# Patient Record
Sex: Male | Born: 1940 | Race: White | Hispanic: No | Marital: Married | Smoking: Former smoker
Health system: Southern US, Community
[De-identification: ages and names within clinical notes are randomized; demographics above are authoritative.]

## PROBLEM LIST (undated history)

## (undated) DIAGNOSIS — H3530 Unspecified macular degeneration: Secondary | ICD-10-CM

## (undated) DIAGNOSIS — K222 Esophageal obstruction: Secondary | ICD-10-CM

## (undated) DIAGNOSIS — E878 Other disorders of electrolyte and fluid balance, not elsewhere classified: Secondary | ICD-10-CM

## (undated) DIAGNOSIS — M461 Sacroiliitis, not elsewhere classified: Secondary | ICD-10-CM

## (undated) DIAGNOSIS — H9319 Tinnitus, unspecified ear: Secondary | ICD-10-CM

## (undated) DIAGNOSIS — R200 Anesthesia of skin: Secondary | ICD-10-CM

## (undated) DIAGNOSIS — I10 Essential (primary) hypertension: Secondary | ICD-10-CM

## (undated) DIAGNOSIS — I219 Acute myocardial infarction, unspecified: Secondary | ICD-10-CM

## (undated) DIAGNOSIS — I2510 Atherosclerotic heart disease of native coronary artery without angina pectoris: Secondary | ICD-10-CM

## (undated) DIAGNOSIS — H9191 Unspecified hearing loss, right ear: Secondary | ICD-10-CM

## (undated) DIAGNOSIS — C801 Malignant (primary) neoplasm, unspecified: Secondary | ICD-10-CM

## (undated) DIAGNOSIS — K635 Polyp of colon: Secondary | ICD-10-CM

## (undated) DIAGNOSIS — K219 Gastro-esophageal reflux disease without esophagitis: Secondary | ICD-10-CM

## (undated) DIAGNOSIS — G500 Trigeminal neuralgia: Secondary | ICD-10-CM

## (undated) HISTORY — PX: TRIGEMINAL NERVE BALLOON DECOMPRESSION: SHX2578

## (undated) HISTORY — PX: OTHER SURGICAL HISTORY: SHX169

## (undated) HISTORY — DX: Esophageal obstruction: K22.2

## (undated) HISTORY — PX: CYSTOSCOPY: SUR368

## (undated) HISTORY — DX: Tinnitus, unspecified ear: H93.19

## (undated) HISTORY — PX: ESOPHAGOGASTRODUODENOSCOPY: SHX1529

## (undated) HISTORY — PX: TONSILLECTOMY: SUR1361

## (undated) HISTORY — DX: Unspecified macular degeneration: H35.30

## (undated) HISTORY — PX: PROSTATE BIOPSY: SHX241

---

## 1998-10-05 ENCOUNTER — Ambulatory Visit (HOSPITAL_COMMUNITY): Admission: RE | Admit: 1998-10-05 | Discharge: 1998-10-05 | Payer: Self-pay | Admitting: Gastroenterology

## 2001-04-16 ENCOUNTER — Ambulatory Visit (HOSPITAL_COMMUNITY): Admission: RE | Admit: 2001-04-16 | Discharge: 2001-04-16 | Payer: Self-pay | Admitting: Gastroenterology

## 2001-04-16 ENCOUNTER — Encounter: Payer: Self-pay | Admitting: Gastroenterology

## 2005-01-14 DIAGNOSIS — I219 Acute myocardial infarction, unspecified: Secondary | ICD-10-CM

## 2005-01-14 HISTORY — DX: Acute myocardial infarction, unspecified: I21.9

## 2006-02-20 ENCOUNTER — Inpatient Hospital Stay (HOSPITAL_BASED_OUTPATIENT_CLINIC_OR_DEPARTMENT_OTHER): Admission: RE | Admit: 2006-02-20 | Discharge: 2006-02-20 | Payer: Self-pay | Admitting: Interventional Cardiology

## 2011-06-14 ENCOUNTER — Ambulatory Visit (HOSPITAL_COMMUNITY): Payer: Medicare Other

## 2011-06-14 ENCOUNTER — Ambulatory Visit (HOSPITAL_COMMUNITY)
Admission: RE | Admit: 2011-06-14 | Discharge: 2011-06-14 | Disposition: A | Discharge status: Home / Self Care | Payer: Medicare Other | Source: Ambulatory Visit | Attending: Gastroenterology | Admitting: Gastroenterology

## 2011-06-14 ENCOUNTER — Encounter (HOSPITAL_COMMUNITY): Payer: Self-pay | Admitting: *Deleted

## 2011-06-14 ENCOUNTER — Encounter (HOSPITAL_COMMUNITY)
Admission: RE | Disposition: A | Discharge status: Home / Self Care | Payer: Self-pay | Source: Ambulatory Visit | Attending: Gastroenterology

## 2011-06-14 DIAGNOSIS — K222 Esophageal obstruction: Secondary | ICD-10-CM | POA: Insufficient documentation

## 2011-06-14 DIAGNOSIS — Z79899 Other long term (current) drug therapy: Secondary | ICD-10-CM | POA: Insufficient documentation

## 2011-06-14 DIAGNOSIS — I10 Essential (primary) hypertension: Secondary | ICD-10-CM | POA: Insufficient documentation

## 2011-06-14 DIAGNOSIS — I2510 Atherosclerotic heart disease of native coronary artery without angina pectoris: Secondary | ICD-10-CM | POA: Insufficient documentation

## 2011-06-14 HISTORY — DX: Atherosclerotic heart disease of native coronary artery without angina pectoris: I25.10

## 2011-06-14 HISTORY — PX: SAVORY DILATION: SHX5439

## 2011-06-14 HISTORY — DX: Sacroiliitis, not elsewhere classified: M46.1

## 2011-06-14 HISTORY — PX: ESOPHAGOGASTRODUODENOSCOPY: SHX5428

## 2011-06-14 HISTORY — DX: Anesthesia of skin: R20.0

## 2011-06-14 HISTORY — DX: Esophageal obstruction: K22.2

## 2011-06-14 HISTORY — DX: Polyp of colon: K63.5

## 2011-06-14 HISTORY — DX: Essential (primary) hypertension: I10

## 2011-06-14 HISTORY — DX: Other disorders of electrolyte and fluid balance, not elsewhere classified: E87.8

## 2011-06-14 HISTORY — DX: Trigeminal neuralgia: G50.0

## 2011-06-14 HISTORY — DX: Unspecified hearing loss, right ear: H91.91

## 2011-06-14 SURGERY — EGD (ESOPHAGOGASTRODUODENOSCOPY)
Anesthesia: Moderate Sedation

## 2011-06-14 MED ORDER — BUTAMBEN-TETRACAINE-BENZOCAINE 2-2-14 % EX AERO
INHALATION_SPRAY | CUTANEOUS | Status: DC | PRN
  Administered 2011-06-14 (×2): 1 via TOPICAL

## 2011-06-14 MED ORDER — MIDAZOLAM HCL 10 MG/2ML IJ SOLN
INTRAMUSCULAR | Status: DC | PRN
  Administered 2011-06-14: 2 mg via INTRAVENOUS
  Administered 2011-06-14: 1 mg via INTRAVENOUS
  Administered 2011-06-14: 2 mg via INTRAVENOUS

## 2011-06-14 MED ORDER — FENTANYL CITRATE 0.05 MG/ML IJ SOLN
INTRAMUSCULAR | Status: AC
  Filled 2011-06-14: qty 2

## 2011-06-14 MED ORDER — SODIUM CHLORIDE 0.9 % IV SOLN
Freq: Once | INTRAVENOUS | Status: AC
  Administered 2011-06-14: 500 mL via INTRAVENOUS

## 2011-06-14 MED ORDER — FENTANYL NICU IV SYRINGE 50 MCG/ML
INJECTION | INTRAMUSCULAR | Status: DC | PRN
  Administered 2011-06-14 (×2): 25 ug via INTRAVENOUS

## 2011-06-14 MED ORDER — MIDAZOLAM HCL 10 MG/2ML IJ SOLN
INTRAMUSCULAR | Status: AC
  Filled 2011-06-14: qty 2

## 2011-06-14 NOTE — Discharge Instructions (Addendum)
Continue current meds. Clear liquids for 4-6 hours, if no chest pain or trouble breathing, soft foods tonight.  Regular diet tomorrow. Call for problems. Call Dr Randa Evens for further problemsEndoscopy Care After Please read the instructions outlined below and refer to this sheet in the next few weeks. These discharge instructions provide you with general information on caring for yourself after you leave the hospital. Your doctor may also give you specific instructions. While your treatment has been planned according to the most current medical practices available, unavoidable complications occasionally occur. If you have any problems or questions after discharge, please call your doctor. HOME CARE INSTRUCTIONS Activity  You may resume your regular activity but move at a slower pace for the next 24 hours.   Take frequent rest periods for the next 24 hours.   Walking will help expel (get rid of) the air and reduce the bloated feeling in your abdomen.   No driving for 24 hours (because of the anesthesia (medicine) used during the test).   You may shower.   Do not sign any important legal documents or operate any machinery for 24 hours (because of the anesthesia used during the test).  Nutrition  Drink plenty of fluids.   You may resume your normal diet.   Begin with a light meal and progress to your normal diet.   Avoid alcoholic beverages for 24 hours or as instructed by your caregiver.  Medications You may resume your normal medications unless your caregiver tells you otherwise. What you can expect today  You may experience abdominal discomfort such as a feeling of fullness or "gas" pains.   You may experience a sore throat for 2 to 3 days. This is normal. Gargling with salt water may help this.  Follow-up Your doctor will discuss the results of your test with you. SEEK IMMEDIATE MEDICAL CARE IF:  You have excessive nausea (feeling sick to your stomach) and/or vomiting.   You  have severe abdominal pain and distention (swelling).   You have trouble swallowing.   You have a temperature over 100 F (37.8 C).   You have rectal bleeding or vomiting of blood.  Document Released: 08/15/2003 Document Revised: 12/20/2010 Document Reviewed: 02/25/2007 [facility] Patient Information 2012 [place], [place].

## 2011-06-14 NOTE — Op Note (Signed)
[facility] [address] [place], [place]  [address]  ENDOSCOPY PROCEDURE REPORT  PATIENT:  Billy Knox, Billy Knox  MR#:  [id number] BIRTHDATE:  [birth year], 70 yrs. old  GENDER:  male  ENDOSCOPIST:  Carman Ching Referred by:  Catha Gosselin, M.D.  PROCEDURE DATE:  06/14/2011 PROCEDURE:  EGD with dilatation over guidewire ASA CLASS:  Class II INDICATIONS:  dysphagia with history of stricture dilated about 10 yrs  MEDICATIONS:   Fentanyl 50 mcg IV, Versed 5 mg IV TOPICAL ANESTHETIC:  Cetacaine Spray  DESCRIPTION OF PROCEDURE:   After the risks and benefits of the procedure were explained, informed consent was obtained.  The Pentax Gastroscope [id number] endoscope was introduced through the mouth and advanced to the second portion of the duodenum.  The instrument was slowly withdrawn as the mucosa was fully examined.  the duodenal bulb and second duodenum were without abnormalities. nl stomach.  A stricture was found at the gastroesophageal junction. there was a very minimal stricture the scope easily passed savary / guidewire 14 mm Savary 15 mm Savary both dilators passed very easily. There was a small amount of heme on the 15 mm dilator and guidewire were subsequently removed.    Retroflexed views revealed no abnormalities.    The scope was then withdrawn from the patient and the procedure completed.  COMPLICATIONS:  A complication of none occurred on 06/14/2011 at.  ENDOSCOPIC IMPRESSION: 1) Nl duodenum 2) Nl stomach 3) Stricture at the gastroesophageal junction stricture dilated to 15 mm RECOMMENDATIONS: 1) Clear liquids for 6 hours, call for chest pain. Soft diet today, resume regular diet tomorrow. return to office or call when necessary for furtherdysphagia  ______________________________ Carman Ching  CC:  Catha Gosselin, MD  n. Rosalie DoctorCarman Ching at 06/14/2011 09:45 AM  Michaela Corner, [phone]

## 2011-06-14 NOTE — H&P (Signed)
  Subjective:   Patient is a 71 y.o. male presents with dysphagia and history of esophageal stricture for dilatation. Procedure including risks and benefits discussed in office.  There are no active problems to display for this patient.  Past Medical History  Diagnosis Date  . Coronary artery disease   . Hypertension   . Hyperchloremia   . Esophageal stricture   . Colon polyp   . Sacroiliac inflammation   . Trigeminal neuralgia   . Deafness in right ear   . Rt facial numbness     Past Surgical History  Procedure Date  . Prostate biopsy   . Gamma knife   . Esophagogastroduodenoscopy     Prescriptions prior to admission  Medication Sig Dispense Refill  . acetaminophen (TYLENOL) 325 MG tablet Take 650 mg by mouth every 6 (six) hours as needed.      Marland Kitchen aspirin 81 MG tablet Take 81 mg by mouth daily.      . finasteride (PROSCAR) 5 MG tablet Take 5 mg by mouth daily.      . Glucosamine HCl 1500 MG TABS Take by mouth.      Marland Kitchen lisinopril (PRINIVIL,ZESTRIL) 10 MG tablet Take 10 mg by mouth daily.      . metoprolol (LOPRESSOR) 50 MG tablet Take 50 mg by mouth daily.      . niacin (NIASPAN) 500 MG CR tablet Take 500 mg by mouth at bedtime.      . nortriptyline (PAMELOR) 50 MG capsule Take 50 mg by mouth at bedtime.      . Omega-3 Fatty Acids (FISH OIL) 1200 MG CAPS Take 2,400 mg by mouth 2 (two) times daily.      . pravastatin (PRAVACHOL) 40 MG tablet Take 40 mg by mouth daily.      . nitroGLYCERIN (NITROSTAT) 0.4 MG SL tablet Place 0.4 mg under the tongue every 5 (five) minutes as needed.       Not on File  History  Substance Use Topics  . Smoking status: Former Smoker    Quit date: 06/14/1967  . Smokeless tobacco: Not on file  . Alcohol Use: 0.5 oz/week    1 drink(s) per week    Family History  Problem Relation Age of Onset  . Colon cancer Mother      Objective:   Patient Vitals for the past 8 hrs:  BP Temp Pulse Resp Height Weight  06/14/11 0858 147/87 mmHg 97.7 F  (36.5 C) 72  22  6\' 2"  (1.88 m) 106.595 kg (235 lb)         See MD Preop evaluation      Assessment:   Active Problems:  * No active hospital problems. *    Plan:   EGD and Savary dilatation under fluoroscopic control. Procedure and risk again discussed with patient

## 2011-06-18 ENCOUNTER — Encounter (HOSPITAL_COMMUNITY): Payer: Self-pay | Admitting: Gastroenterology

## 2012-10-12 ENCOUNTER — Other Ambulatory Visit: Payer: Self-pay | Admitting: Interventional Cardiology

## 2012-10-12 DIAGNOSIS — E785 Hyperlipidemia, unspecified: Secondary | ICD-10-CM

## 2012-10-12 DIAGNOSIS — Z79899 Other long term (current) drug therapy: Secondary | ICD-10-CM

## 2012-10-15 ENCOUNTER — Ambulatory Visit (INDEPENDENT_AMBULATORY_CARE_PROVIDER_SITE_OTHER): Payer: Medicare Other | Admitting: Interventional Cardiology

## 2012-10-15 DIAGNOSIS — E7800 Pure hypercholesterolemia, unspecified: Secondary | ICD-10-CM

## 2012-10-15 LAB — LIPID PANEL: Cholesterol: 155 mg/dL (ref 0–200)

## 2012-12-04 ENCOUNTER — Encounter: Payer: Self-pay | Admitting: Interventional Cardiology

## 2012-12-04 ENCOUNTER — Telehealth: Payer: Self-pay | Admitting: Acute Care

## 2013-01-15 ENCOUNTER — Telehealth: Payer: Self-pay

## 2013-01-15 MED ORDER — PRAVASTATIN SODIUM 40 MG PO TABS: ORAL_TABLET | ORAL | Status: DC

## 2013-01-15 NOTE — Telephone Encounter (Signed)
Refilled. Pt aware

## 2013-01-20 ENCOUNTER — Other Ambulatory Visit: Payer: Self-pay

## 2013-01-20 MED ORDER — PRAVASTATIN SODIUM 40 MG PO TABS: ORAL_TABLET | ORAL | Status: DC

## 2013-01-25 ENCOUNTER — Other Ambulatory Visit: Payer: Self-pay

## 2013-01-25 MED ORDER — PRAVASTATIN SODIUM 40 MG PO TABS: ORAL_TABLET | ORAL | Status: DC

## 2013-02-01 ENCOUNTER — Telehealth: Payer: Self-pay

## 2013-02-02 MED ORDER — NIACIN ER (ANTIHYPERLIPIDEMIC) 500 MG PO TBCR: 500.0000 mg | EXTENDED_RELEASE_TABLET | Freq: Every day | ORAL | Status: DC

## 2013-02-02 NOTE — Telephone Encounter (Signed)
Refilled

## 2013-02-08 ENCOUNTER — Telehealth: Payer: Self-pay | Admitting: *Deleted

## 2013-02-08 MED ORDER — PRAVASTATIN SODIUM 80 MG PO TABS: 80.0000 mg | ORAL_TABLET | Freq: Every day | ORAL | Status: DC

## 2013-02-08 NOTE — Telephone Encounter (Signed)
Formulary change for insurance purposes

## 2013-02-15 ENCOUNTER — Telehealth: Payer: Self-pay | Admitting: *Deleted

## 2013-02-15 NOTE — Telephone Encounter (Signed)
Per [facility] for Dr Elisabeth Cara, 2015 formulary change pravastatin 40 mg bid to 80 mg daily

## 2013-02-18 NOTE — Telephone Encounter (Signed)
Quality limit exception request for pravastatin approved through [employer]. Member #[id number]

## 2013-02-22 ENCOUNTER — Other Ambulatory Visit: Payer: Self-pay

## 2013-02-22 MED ORDER — PRAVASTATIN SODIUM 80 MG PO TABS: 80.0000 mg | ORAL_TABLET | Freq: Every day | ORAL | Status: DC

## 2013-03-16 ENCOUNTER — Encounter: Payer: Self-pay | Admitting: Interventional Cardiology

## 2013-03-16 DIAGNOSIS — G500 Trigeminal neuralgia: Secondary | ICD-10-CM | POA: Insufficient documentation

## 2013-03-16 DIAGNOSIS — K635 Polyp of colon: Secondary | ICD-10-CM | POA: Insufficient documentation

## 2013-03-16 DIAGNOSIS — K222 Esophageal obstruction: Secondary | ICD-10-CM | POA: Insufficient documentation

## 2013-03-16 DIAGNOSIS — I10 Essential (primary) hypertension: Secondary | ICD-10-CM | POA: Insufficient documentation

## 2013-03-16 DIAGNOSIS — R200 Anesthesia of skin: Secondary | ICD-10-CM | POA: Insufficient documentation

## 2013-03-16 DIAGNOSIS — I2510 Atherosclerotic heart disease of native coronary artery without angina pectoris: Secondary | ICD-10-CM | POA: Insufficient documentation

## 2013-03-16 DIAGNOSIS — H9191 Unspecified hearing loss, right ear: Secondary | ICD-10-CM | POA: Insufficient documentation

## 2013-03-16 DIAGNOSIS — E878 Other disorders of electrolyte and fluid balance, not elsewhere classified: Secondary | ICD-10-CM | POA: Insufficient documentation

## 2013-03-16 DIAGNOSIS — M461 Sacroiliitis, not elsewhere classified: Secondary | ICD-10-CM | POA: Insufficient documentation

## 2013-03-23 ENCOUNTER — Ambulatory Visit: Payer: Medicare Other | Admitting: Interventional Cardiology

## 2013-03-24 ENCOUNTER — Ambulatory Visit (INDEPENDENT_AMBULATORY_CARE_PROVIDER_SITE_OTHER): Payer: Medicare Other | Admitting: Interventional Cardiology

## 2013-03-24 ENCOUNTER — Encounter: Payer: Self-pay | Admitting: Interventional Cardiology

## 2013-03-24 VITALS — BP 140/80 | HR 59 | Ht 74.0 in | Wt 227.8 lb

## 2013-03-24 DIAGNOSIS — I2510 Atherosclerotic heart disease of native coronary artery without angina pectoris: Secondary | ICD-10-CM

## 2013-03-24 DIAGNOSIS — I10 Essential (primary) hypertension: Secondary | ICD-10-CM

## 2013-03-24 DIAGNOSIS — E782 Mixed hyperlipidemia: Secondary | ICD-10-CM | POA: Insufficient documentation

## 2013-03-24 DIAGNOSIS — Z79899 Other long term (current) drug therapy: Secondary | ICD-10-CM

## 2013-03-24 LAB — COMPREHENSIVE METABOLIC PANEL
ALK PHOS: 49 U/L (ref 39–117)
ALT: 33 U/L (ref 0–53)
AST: 27 U/L (ref 0–37)
Albumin: 4.5 g/dL (ref 3.5–5.2)
BILIRUBIN TOTAL: 0.7 mg/dL (ref 0.3–1.2)
BUN: 10 mg/dL (ref 6–23)
CHLORIDE: 99 meq/L (ref 96–112)
CO2: 29 mEq/L (ref 19–32)
CREATININE: 0.9 mg/dL (ref 0.4–1.5)
Calcium: 9.4 mg/dL (ref 8.4–10.5)
GFR: 91.58 mL/min (ref >60.00)
Glucose, Bld: 100 mg/dL — ABNORMAL HIGH (ref 70–99)
Potassium: 3.9 mEq/L (ref 3.5–5.1)
Sodium: 136 mEq/L (ref 135–145)
Total Protein: 7.3 g/dL (ref 6.0–8.3)

## 2013-03-24 NOTE — Progress Notes (Signed)
Patient ID: Billy Knox, male   DOB: 06/26/1940, 73 y.o.   MRN: [id number]    [place] [place], [place]  [address] Phone: [phone] Fax:  [phone]  Date:  03/24/2013   ID:  Billy Knox, DOB 1940-06-23, MRN [id number]  PCP:  Gennette Pac, MD      History of Present Illness: Billy Knox is a 73 y.o. male with CAD. He has had gamma knife surgery for tic doloreux in 2013.  He still has sx. He walks for 3 miles/hour x 1 hour, nearly daily. More in the summer. He has lost some weight. CAD/ASCVD:  Denies : Chest pain.  Dizziness.  Dyspnea on exertion.  Leg edema.  Nitroglycerin.  Palpitations.  Shortness of breath.  Syncope.     Wt Readings from Last 3 Encounters:  03/24/13 227 lb 12.8 oz (103.329 kg)  06/14/11 235 lb (106.595 kg)  06/14/11 235 lb (106.595 kg)     Past Medical History  Diagnosis Date  . Coronary artery disease   . Hypertension   . Hyperchloremia   . Esophageal stricture   . Colon polyp   . Sacroiliac inflammation   . Trigeminal neuralgia   . Deafness in right ear   . Rt facial numbness     Current Outpatient Prescriptions  Medication Sig Dispense Refill  . acetaminophen (TYLENOL) 325 MG tablet Take 650 mg by mouth every 6 (six) hours as needed.      Marland Kitchen aspirin 81 MG tablet Take 81 mg by mouth daily.      . finasteride (PROSCAR) 5 MG tablet Take 5 mg by mouth daily.      . Glucosamine HCl 1500 MG TABS Take by mouth.      Marland Kitchen lisinopril (PRINIVIL,ZESTRIL) 10 MG tablet Take 20 mg by mouth daily.       . metoprolol (LOPRESSOR) 50 MG tablet Take 50 mg by mouth daily.      . niacin (NIASPAN) 500 MG CR tablet Take 1 tablet (500 mg total) by mouth at bedtime.  90 tablet  3  . nitroGLYCERIN (NITROSTAT) 0.4 MG SL tablet Place 0.4 mg under the tongue every 5 (five) minutes as needed.      . nortriptyline (PAMELOR) 50 MG capsule Take 50 mg by mouth at bedtime.      . Omega-3 Fatty Acids (FISH OIL) 1200 MG CAPS Take 2,400 mg by  mouth 2 (two) times daily.      . pravastatin (PRAVACHOL) 80 MG tablet Take 1 tablet (80 mg total) by mouth daily.  90 tablet  1   No current facility-administered medications for this visit.    Allergies:   No Known Allergies  Social History:  The patient  reports that he quit smoking about 45 years ago. He does not have any smokeless tobacco history on file. He reports that he drinks about 0.5 ounces of alcohol per week. He reports that he does not use illicit drugs.   Family History:  The patient's family history includes Colon cancer in his mother.   ROS:  Please see the history of present illness.  No nausea, vomiting.  No fevers, chills.  No focal weakness.  No dysuria.    All other systems reviewed and negative.   PHYSICAL EXAM: VS:  BP 140/80  Pulse 59  Ht 6\' 2"  (1.88 m)  Wt 227 lb 12.8 oz (103.329 kg)  BMI 29.24 kg/m2 Well nourished, well developed, in no acute distress  HEENT: normal Neck: no JVD, no carotid bruits Cardiac:  normal S1, S2; RRR;  Lungs:  clear to auscultation bilaterally, no wheezing, rhonchi or rales Abd: soft, nontender, no hepatomegaly Ext: no edema Skin: warm and dry Neuro:   no focal abnormalities noted  EKG:  NSR, no ST segments   ASSESSMENT AND PLAN:  Coronary atherosclerosis of native coronary artery  Continue Aspir-81 Tablet Delayed Release, 81 MG, 1 tablet, Orally, Once a day Refill Metoprolol Succinate Tablet Extended Release 24 Hour, 50, 1 tablet, Orally, once a day, 90 days, 90, Refills 3 Refill Nitroglycerin Tablet Sublingual, 0.4 MG, 1 tablet under the tongue, Sublingual, as directed, 30 days, 25, Refills 3 IMAGING: EKG    Corson,Danielle 03/24/2012 08:59:42 AM > [name],JAY 03/24/2012 09:31:57 AM > NSR, no significant ST segment changes   Notes: No angina. Walking outside these days when weather allows, otherwise inside. Encouraged him to continue. Walks a full hour without stopping.   2. Hypertension, essential  Continue  Lisinopril Tablet, 10 MG, 1-2 tablet, Orally, once a day, 90 days, 180, Refills 3 Notes: Controlled at home. 130/70 range. Taes 1.5 tabs of lisinopril daily at this time.    3. Pure hypercholesterolemia  Continue Pravastatin Sodium Tablet, 40 MG, 2 tablet, orally, once a day, 180, Refills 3 Continue Fish Oil Capsule, 4800 mg, Orally, daily per Dr Irish Lack Refill Niaspan Tablet Extended Release, 500 MG, 1 tablet at bedtime, Orally, Once a day, 90 days, 90, Refills 3 Notes: Controlled in 7/13. ; 10/14: TG 195, LDL 78, HL 38 TC 155. Check CMet today. Repeat lipids in 10/15.   4. Obesity, unspecified  Notes: Continue efforts to try to lose weight through diet control and increased exercise.    Preventive Medicine  Adult topics discussed:  Exercise: at least5 days a week, at least 30 minutes of aerobic exercise.      Signed, Mina Marble, MD, [facility] 03/24/2013 9:14 AM

## 2013-03-24 NOTE — Patient Instructions (Addendum)
Your physician recommends that you return for lab work today for CMET.  Your physician recommends that you return for a FASTING lipid profile: 10/25/13  Your physician wants you to follow-up in: 1 year with Dr. Irish Lack. You will receive a reminder letter in the mail two months in advance. If you don't receive a letter, please call our office to schedule the follow-up appointment.

## 2013-03-26 ENCOUNTER — Telehealth: Payer: Self-pay | Admitting: Cardiology

## 2013-03-26 NOTE — Telephone Encounter (Signed)
Message copied by Alcario Drought on Fri Mar 26, 2013  9:32 AM ------      Message from: Jettie Booze      Created: Wed Mar 24, 2013 11:24 PM       Electrolytes stable. COntinue current meds, ------

## 2013-05-04 ENCOUNTER — Telehealth: Payer: Self-pay | Admitting: *Deleted

## 2013-05-04 MED ORDER — METOPROLOL SUCCINATE ER 50 MG PO TB24: 50.0000 mg | ORAL_TABLET | Freq: Every day | ORAL | Status: DC

## 2013-05-04 NOTE — Telephone Encounter (Signed)
Spoke with pt and he is taking Topril XL 50 mg once a day. Refill to [facility] and refill to [facility].

## 2013-05-04 NOTE — Telephone Encounter (Signed)
Refills sent in. Pt aware.

## 2013-05-04 NOTE — Telephone Encounter (Signed)
[facility] requests refill on metoprolol succinate for the patient, but his list has metoprolol tartrate. Which one is he on? He is out so they also requested that we [facility]?? Please advise. Thanks, MI

## 2013-05-21 ENCOUNTER — Other Ambulatory Visit: Payer: Self-pay | Admitting: Family Medicine

## 2013-05-21 ENCOUNTER — Ambulatory Visit
Admission: RE | Admit: 2013-05-21 | Discharge: 2013-05-21 | Disposition: A | Discharge status: Home / Self Care | Payer: Medicare Other | Source: Ambulatory Visit | Attending: Family Medicine | Admitting: Family Medicine

## 2013-05-21 DIAGNOSIS — M545 Low back pain, unspecified: Secondary | ICD-10-CM

## 2013-08-26 NOTE — Telephone Encounter (Signed)
error 

## 2013-09-02 ENCOUNTER — Other Ambulatory Visit: Payer: Self-pay

## 2013-09-02 MED ORDER — PRAVASTATIN SODIUM 80 MG PO TABS: 80.0000 mg | ORAL_TABLET | Freq: Every day | ORAL | Status: DC

## 2013-09-09 ENCOUNTER — Other Ambulatory Visit: Payer: Self-pay

## 2013-09-09 MED ORDER — LISINOPRIL 10 MG PO TABS: 20.0000 mg | ORAL_TABLET | Freq: Every day | ORAL | Status: DC

## 2013-09-16 ENCOUNTER — Other Ambulatory Visit: Payer: Self-pay | Admitting: *Deleted

## 2013-09-16 MED ORDER — PRAVASTATIN SODIUM 80 MG PO TABS: 80.0000 mg | ORAL_TABLET | Freq: Every day | ORAL | Status: DC

## 2013-10-25 ENCOUNTER — Other Ambulatory Visit (INDEPENDENT_AMBULATORY_CARE_PROVIDER_SITE_OTHER): Payer: Medicare Other | Admitting: *Deleted

## 2013-10-25 DIAGNOSIS — I2510 Atherosclerotic heart disease of native coronary artery without angina pectoris: Secondary | ICD-10-CM

## 2013-10-25 LAB — LIPID PANEL
Cholesterol: 139 mg/dL (ref 0–200)
HDL: 33.2 mg/dL — ABNORMAL LOW (ref >39.00)
LDL Cholesterol: 74 mg/dL (ref 0–99)
NonHDL: 105.8
Total CHOL/HDL Ratio: 4
Triglycerides: 159 mg/dL — ABNORMAL HIGH (ref 0.0–149.0)
VLDL: 31.8 mg/dL (ref 0.0–40.0)

## 2013-10-25 LAB — HEPATIC FUNCTION PANEL
ALBUMIN: 3.7 g/dL (ref 3.5–5.2)
ALK PHOS: 42 U/L (ref 39–117)
ALT: 22 U/L (ref 0–53)
AST: 23 U/L (ref 0–37)
BILIRUBIN DIRECT: 0 mg/dL (ref 0.0–0.3)
BILIRUBIN TOTAL: 0.6 mg/dL (ref 0.2–1.2)
Total Protein: 7.1 g/dL (ref 6.0–8.3)

## 2013-11-02 NOTE — Progress Notes (Signed)
Patient informed of labs

## 2014-01-31 ENCOUNTER — Other Ambulatory Visit: Payer: Self-pay | Admitting: *Deleted

## 2014-01-31 MED ORDER — PRAVASTATIN SODIUM 80 MG PO TABS: 80.0000 mg | ORAL_TABLET | Freq: Every day | ORAL | Status: DC

## 2014-02-03 ENCOUNTER — Other Ambulatory Visit: Payer: Self-pay

## 2014-02-03 MED ORDER — LISINOPRIL 10 MG PO TABS: 20.0000 mg | ORAL_TABLET | Freq: Every day | ORAL | Status: DC

## 2014-02-21 ENCOUNTER — Other Ambulatory Visit: Payer: Self-pay | Admitting: Interventional Cardiology

## 2014-03-23 ENCOUNTER — Other Ambulatory Visit: Payer: Self-pay | Admitting: *Deleted

## 2014-03-23 MED ORDER — METOPROLOL SUCCINATE ER 50 MG PO TB24: 50.0000 mg | ORAL_TABLET | Freq: Every day | ORAL | Status: DC

## 2014-08-08 ENCOUNTER — Encounter (HOSPITAL_COMMUNITY): Payer: Self-pay | Admitting: *Deleted

## 2014-08-08 ENCOUNTER — Other Ambulatory Visit: Payer: Self-pay | Admitting: Gastroenterology

## 2014-08-08 ENCOUNTER — Other Ambulatory Visit: Payer: Self-pay | Admitting: Physician Assistant

## 2014-08-08 DIAGNOSIS — K222 Esophageal obstruction: Secondary | ICD-10-CM

## 2014-08-08 DIAGNOSIS — R1310 Dysphagia, unspecified: Secondary | ICD-10-CM

## 2014-08-09 ENCOUNTER — Ambulatory Visit
Admission: RE | Admit: 2014-08-09 | Discharge: 2014-08-09 | Disposition: A | Discharge status: Home / Self Care | Payer: Medicare Other | Source: Ambulatory Visit | Attending: Physician Assistant | Admitting: Physician Assistant

## 2014-08-09 DIAGNOSIS — K222 Esophageal obstruction: Secondary | ICD-10-CM

## 2014-08-09 DIAGNOSIS — R1310 Dysphagia, unspecified: Secondary | ICD-10-CM

## 2014-08-09 NOTE — Addendum Note (Signed)
Addended by: Vernie Ammons. on: 08/09/2014 12:38 PM   Modules accepted: Orders

## 2014-08-12 ENCOUNTER — Encounter (HOSPITAL_COMMUNITY)
Admission: RE | Disposition: A | Discharge status: Home / Self Care | Payer: Self-pay | Source: Ambulatory Visit | Attending: Gastroenterology

## 2014-08-12 ENCOUNTER — Encounter (HOSPITAL_COMMUNITY): Payer: Self-pay

## 2014-08-12 ENCOUNTER — Ambulatory Visit (HOSPITAL_COMMUNITY): Payer: Medicare Other | Admitting: Anesthesiology

## 2014-08-12 ENCOUNTER — Ambulatory Visit (HOSPITAL_COMMUNITY): Payer: Medicare Other

## 2014-08-12 ENCOUNTER — Ambulatory Visit (HOSPITAL_COMMUNITY)
Admission: RE | Admit: 2014-08-12 | Discharge: 2014-08-12 | Disposition: A | Discharge status: Home / Self Care | Payer: Medicare Other | Source: Ambulatory Visit | Attending: Gastroenterology | Admitting: Gastroenterology

## 2014-08-12 DIAGNOSIS — Z87891 Personal history of nicotine dependence: Secondary | ICD-10-CM | POA: Diagnosis not present

## 2014-08-12 DIAGNOSIS — E782 Mixed hyperlipidemia: Secondary | ICD-10-CM | POA: Insufficient documentation

## 2014-08-12 DIAGNOSIS — G500 Trigeminal neuralgia: Secondary | ICD-10-CM | POA: Diagnosis not present

## 2014-08-12 DIAGNOSIS — I2510 Atherosclerotic heart disease of native coronary artery without angina pectoris: Secondary | ICD-10-CM | POA: Diagnosis not present

## 2014-08-12 DIAGNOSIS — Z86010 Personal history of colonic polyps: Secondary | ICD-10-CM | POA: Diagnosis not present

## 2014-08-12 DIAGNOSIS — I252 Old myocardial infarction: Secondary | ICD-10-CM | POA: Insufficient documentation

## 2014-08-12 DIAGNOSIS — I10 Essential (primary) hypertension: Secondary | ICD-10-CM | POA: Insufficient documentation

## 2014-08-12 DIAGNOSIS — E878 Other disorders of electrolyte and fluid balance, not elsewhere classified: Secondary | ICD-10-CM | POA: Diagnosis not present

## 2014-08-12 DIAGNOSIS — Z79899 Other long term (current) drug therapy: Secondary | ICD-10-CM | POA: Diagnosis not present

## 2014-08-12 DIAGNOSIS — K222 Esophageal obstruction: Secondary | ICD-10-CM | POA: Diagnosis not present

## 2014-08-12 DIAGNOSIS — Z7982 Long term (current) use of aspirin: Secondary | ICD-10-CM | POA: Insufficient documentation

## 2014-08-12 DIAGNOSIS — K219 Gastro-esophageal reflux disease without esophagitis: Secondary | ICD-10-CM | POA: Diagnosis not present

## 2014-08-12 DIAGNOSIS — H9191 Unspecified hearing loss, right ear: Secondary | ICD-10-CM | POA: Diagnosis not present

## 2014-08-12 HISTORY — DX: Acute myocardial infarction, unspecified: I21.9

## 2014-08-12 HISTORY — DX: Gastro-esophageal reflux disease without esophagitis: K21.9

## 2014-08-12 HISTORY — PX: SAVORY DILATION: SHX5439

## 2014-08-12 HISTORY — PX: ESOPHAGOGASTRODUODENOSCOPY (EGD) WITH PROPOFOL: SHX5813

## 2014-08-12 SURGERY — ESOPHAGOGASTRODUODENOSCOPY (EGD) WITH PROPOFOL
Anesthesia: Monitor Anesthesia Care

## 2014-08-12 MED ORDER — PROPOFOL 10 MG/ML IV BOLUS
INTRAVENOUS | Status: AC
  Filled 2014-08-12: qty 20

## 2014-08-12 MED ORDER — PROPOFOL 500 MG/50ML IV EMUL
INTRAVENOUS | Status: DC | PRN
  Administered 2014-08-12: 40 mg via INTRAVENOUS

## 2014-08-12 MED ORDER — SODIUM CHLORIDE 0.9 % IV SOLN: INTRAVENOUS | Status: DC

## 2014-08-12 MED ORDER — LACTATED RINGERS IV SOLN
INTRAVENOUS | Status: DC
  Administered 2014-08-12: 10:00:00 via INTRAVENOUS
  Administered 2014-08-12: 1000 mL via INTRAVENOUS

## 2014-08-12 MED ORDER — PROPOFOL INFUSION 10 MG/ML OPTIME
INTRAVENOUS | Status: DC | PRN
  Administered 2014-08-12: 100 ug/kg/min via INTRAVENOUS

## 2014-08-12 MED ORDER — LIDOCAINE HCL (CARDIAC) 20 MG/ML IV SOLN
INTRAVENOUS | Status: AC
  Filled 2014-08-12: qty 5

## 2014-08-12 SURGICAL SUPPLY — 15 items

## 2014-08-12 NOTE — Discharge Instructions (Addendum)
Continue current meds. Clear liquids for 4-6 hours, if no chest pain or trouble breathing, soft foods tonight.  Regular diet tomorrow. Call for problems. Suggest OTC omeprazole 1 tablet daily    Esophagogastroduodenoscopy Care After Refer to this sheet in the next few weeks. These instructions provide you with information on caring for yourself after your procedure. Your caregiver may also give you more specific instructions. Your treatment has been planned according to current medical practices, but problems sometimes occur. Call your caregiver if you have any problems or questions after your procedure.  HOME CARE INSTRUCTIONS  Do not eat or drink anything until the numbing medicine (local anesthetic) has worn off and your gag reflex has returned. You will know that the local anesthetic has worn off when you can swallow comfortably.  Do not drive for 12 hours after the procedure or as directed by your caregiver.  Only take medicines as directed by your caregiver. SEEK MEDICAL CARE IF:   You cannot stop coughing.  You are not urinating at all or less than usual. SEEK IMMEDIATE MEDICAL CARE IF:  You have difficulty swallowing.  You cannot eat or drink.  You have worsening throat or chest pain.  You have dizziness, lightheadedness, or you faint.  You have nausea or vomiting.  You have chills.  You have a fever.  You have severe abdominal pain.  You have black, tarry, or bloody stools. Document Released: 12/18/2011 Document Reviewed: 12/18/2011 [facility] Patient Information 2015 [place]. This information is not intended to replace advice given to you by your health care provider. Make sure you discuss any questions you have with your health care provider.

## 2014-08-12 NOTE — Transfer of Care (Signed)
Immediate Anesthesia Transfer of Care Note  Patient: Billy Knox  Procedure(s) Performed: Procedure(s): ESOPHAGOGASTRODUODENOSCOPY (EGD) WITH PROPOFOL (N/A) SAVORY DILATION (N/A)  Patient Location: PACU  Anesthesia Type:MAC  Level of Consciousness: awake, alert  and oriented  Airway & Oxygen Therapy: Patient Spontanous Breathing and Patient connected to nasal cannula oxygen  Post-op Assessment: Report given to RN and Post -op Vital signs reviewed and stable  Post vital signs: Reviewed and stable  Last Vitals:  Filed Vitals:   08/12/14 0948  BP: 168/89  Pulse: 65  Temp: 36.4 C  Resp: 21    Complications: No apparent anesthesia complications

## 2014-08-12 NOTE — H&P (Signed)
Subjective:   Patient is a 74 y.o. male presents with recent food impaction has had a previous history of esophageal stricture requiring dilatation.. Procedure including risks and benefits discussed in office.  Patient Active Problem List   Diagnosis Date Noted  . Mixed hyperlipidemia 03/24/2013  . Coronary artery disease   . Hypertension   . Hyperchloremia   . Esophageal stricture   . Colon polyp   . Sacroiliac inflammation   . Trigeminal neuralgia   . Deafness in right ear   . Rt facial numbness    Past Medical History  Diagnosis Date  . Coronary artery disease   . Hypertension   . Hyperchloremia   . Esophageal stricture   . Colon polyp   . Sacroiliac inflammation   . Trigeminal neuralgia   . Deafness in right ear   . Rt facial numbness   . Myocardial infarction     hx. silent MI - no intervention except oral meds 7 yrs ago  . GERD (gastroesophageal reflux disease)     past history no recent problems    Past Surgical History  Procedure Laterality Date  . Prostate biopsy    . Gamma knife      3 yrs ago [place] area around trigeminal nerve area"  . Esophagogastroduodenoscopy    . Esophagogastroduodenoscopy  06/14/2011    Procedure: ESOPHAGOGASTRODUODENOSCOPY (EGD);  Surgeon: Winfield Cunas., MD;  Location: [facility];  Service: Endoscopy;  Laterality: N/A;  . Savory dilation  06/14/2011    Procedure: SAVORY DILATION;  Surgeon: Winfield Cunas., MD;  Location: [facility];  Service: Endoscopy;  Laterality: N/A;  need xray  . Trigeminal nerve balloon decompression Right     right ear deafness  . Tonsillectomy       child  . Cystoscopy      Prescriptions prior to admission  Medication Sig Dispense Refill Last Dose  . acetaminophen (TYLENOL) 325 MG tablet Take 650 mg by mouth every 6 (six) hours as needed for mild pain.    08/11/2014 at 0830  . aspirin 81 MG tablet Take 81 mg by mouth daily.   08/11/2014 at 0830  . finasteride (PROSCAR) 5 MG tablet Take  5 mg by mouth daily.   08/12/2014 at 0630  . Glucosamine HCl 1500 MG TABS Take 1 tablet by mouth daily.    08/11/2014 at 0830  . lisinopril (PRINIVIL,ZESTRIL) 10 MG tablet Take 2 tablets (20 mg total) by mouth daily. 180 tablet 1 08/12/2014 at 0630  . metoprolol succinate (TOPROL-XL) 50 MG 24 hr tablet Take 1 tablet (50 mg total) by mouth daily. 90 tablet 0 08/11/2014 at 0830  . Multiple Vitamin (MULTIVITAMIN WITH MINERALS) TABS tablet Take 1 tablet by mouth daily.   08/11/2014 at 0830  . niacin (NIASPAN) 500 MG CR tablet TAKE 1 BY MOUTH AT BEDTIME 90 tablet 3 08/11/2014 at 2200  . Omega-3 Fatty Acids (FISH OIL) 1200 MG CAPS Take 2,400 mg by mouth 2 (two) times daily.   Past Week at 0830  . pravastatin (PRAVACHOL) 80 MG tablet Take 1 tablet (80 mg total) by mouth daily. 90 tablet 1 08/11/2014 at 2200  . nitroGLYCERIN (NITROSTAT) 0.4 MG SL tablet Place 0.4 mg under the tongue every 5 (five) minutes as needed.   More than a month at Unknown time   No Known Allergies  History  Substance Use Topics  . Smoking status: Former Smoker    Quit date: 06/14/1967  . Smokeless tobacco: Not on  file  . Alcohol Use: 0.5 oz/week    1 Standard drinks or equivalent per week     Comment: rare    Family History  Problem Relation Age of Onset  . Colon cancer Mother      Objective:   Patient Vitals for the past 8 hrs:  BP Temp Temp src Pulse Resp SpO2 Height Weight  08/12/14 0948 (!) 168/89 mmHg 97.6 F (36.4 C) Oral 65 (!) 21 100 % 6\' 2"  (1.88 m) 90.719 kg (200 lb)         See MD Preop evaluation      Assessment:   1. Esophageal stricture  Plan:   Will proceed at this time with esophageal dilatation have discussed with the patient in detail including the risk. He has had this done before.

## 2014-08-12 NOTE — Anesthesia Preprocedure Evaluation (Addendum)
Anesthesia Evaluation  Patient identified by MRN, date of birth, ID band Patient awake    Reviewed: Allergy & Precautions, NPO status , Patient's Chart, lab work & pertinent test results  Airway Mallampati: II  TM Distance: >3 FB Neck ROM: Full    Dental no notable dental hx.    Pulmonary former smoker,  breath sounds clear to auscultation  Pulmonary exam normal       Cardiovascular hypertension, + CAD and + Past MI Normal cardiovascular examRhythm:Regular Rate:Normal     Neuro/Psych  Neuromuscular disease negative psych ROS   GI/Hepatic Neg liver ROS, GERD-  ,  Endo/Other  negative endocrine ROS  Renal/GU negative Renal ROS  negative genitourinary   Musculoskeletal  (+) Arthritis -,   Abdominal   Peds negative pediatric ROS (+)  Hematology negative hematology ROS (+)   Anesthesia Other Findings   Reproductive/Obstetrics negative OB ROS                            Anesthesia Physical Anesthesia Plan  ASA: III  Anesthesia Plan: MAC   Post-op Pain Management:    Induction: Intravenous  Airway Management Planned: Natural Airway  Additional Equipment:   Intra-op Plan:   Post-operative Plan:   Informed Consent: I have reviewed the patients History and Physical, chart, labs and discussed the procedure including the risks, benefits and alternatives for the proposed anesthesia with the patient or authorized representative who has indicated his/her understanding and acceptance.   Dental advisory given  Plan Discussed with: CRNA  Anesthesia Plan Comments:         Anesthesia Quick Evaluation

## 2014-08-12 NOTE — Anesthesia Postprocedure Evaluation (Signed)
  Anesthesia Post-op Note  Patient: Billy Knox  Procedure(s) Performed: Procedure(s) (LRB): ESOPHAGOGASTRODUODENOSCOPY (EGD) WITH PROPOFOL (N/A) SAVORY DILATION (N/A)  Patient Location: PACU  Anesthesia Type: MAC  Level of Consciousness: awake and alert   Airway and Oxygen Therapy: Patient Spontanous Breathing  Post-op Pain: mild  Post-op Assessment: Post-op Vital signs reviewed, Patient's Cardiovascular Status Stable, Respiratory Function Stable, Patent Airway and No signs of Nausea or vomiting  Last Vitals:  Filed Vitals:   08/12/14 1250  BP: 154/79  Pulse: 59  Temp:   Resp: 16    Post-op Vital Signs: stable   Complications: No apparent anesthesia complications

## 2014-08-12 NOTE — Op Note (Signed)
[facility] [place], [address]   ENDOSCOPY PROCEDURE REPORT  PATIENT: Billy Knox, Billy Knox  MR#: [id number] BIRTHDATE: 02-Aug-1940 , 73  yrs. old GENDER: male ENDOSCOPIST:Ryin Schillo Oletta Lamas, MD REFERRED BY: Dr. Hulan Fess PROCEDURE DATE:  08/12/2014 PROCEDURE:   EGD with Savary dilatation using fluoroscopic guidance. ASA CLASS:    class II INDICATIONS: patient with history of reflux and prior esophageal stricture. Was out of town on vacation and had a food impaction requiring extensive procedure to remove. MEDICATION: propofol 220 mg IV TOPICAL ANESTHETIC:   Cetacaine Spray  DESCRIPTION OF PROCEDURE:   After the risks and benefits of the procedure were explained, informed consent was obtained.  The Pentax Gastroscope [id number]  endoscope was introduced through the mouth  and advanced to the second portion of the duodenum .  The instrument was slowly withdrawn as the mucosa was fully examined. Estimated blood loss is zero unless otherwise noted in this procedure report. There was a minimal stricture at the GE junction that was easily pass with the pediatric gastric scope. We used fluoroscopic guidance and placed the Savary guide wire through the endoscope and withdrew the scope. Using fluoroscopic guidance we then passed 12, 12.8, 14, and 15 mm dilators. There was no heme on any of the dilators. After passage of the 15 mm dilator, the guide wire and dilator for withdrawal. The patient tolerated the procedure well there were no immediate complications.    The scope was then withdrawn from the patient and the procedure completed.  COMPLICATIONS: There were no immediate complications.  ENDOSCOPIC IMPRESSION: 1. Esophageal Stricture. Dilated to 15 mm. RECOMMENDATIONS: 1. routine post dilatation orders 2. Will recommend that he remain on low-dose PPI OTC. 3. Follow-up dilatation PRN   _______________________________ eSignedLaurence Spates, MD  08/12/2014 12:27 PM     cc: Dr. Rex Kras  CPT CODES: ICD CODES:  The ICD and CPT codes recommended by this software are interpretations from the data that the clinical staff has captured with the software.  The verification of the translation of this report to the ICD and CPT codes and modifiers is the sole responsibility of the health care institution and practicing physician where this report was generated.  [employer]. will not be held responsible for the validity of the ICD and CPT codes included on this report.  AMA assumes no liability for data contained or not contained herein. CPT is a Designer, television/film set of the [employer].

## 2014-08-15 ENCOUNTER — Encounter (HOSPITAL_COMMUNITY): Payer: Self-pay | Admitting: Gastroenterology

## 2014-08-15 ENCOUNTER — Other Ambulatory Visit: Payer: Self-pay

## 2014-08-15 MED ORDER — LISINOPRIL 10 MG PO TABS: 20.0000 mg | ORAL_TABLET | Freq: Every day | ORAL | Status: DC

## 2014-08-19 ENCOUNTER — Other Ambulatory Visit: Payer: Self-pay

## 2014-08-19 MED ORDER — METOPROLOL SUCCINATE ER 50 MG PO TB24: 50.0000 mg | ORAL_TABLET | Freq: Every day | ORAL | Status: DC

## 2014-08-24 ENCOUNTER — Other Ambulatory Visit: Payer: Self-pay | Admitting: *Deleted

## 2014-08-24 MED ORDER — PRAVASTATIN SODIUM 80 MG PO TABS: 80.0000 mg | ORAL_TABLET | Freq: Every day | ORAL | Status: DC

## 2014-09-08 ENCOUNTER — Encounter: Payer: Self-pay | Admitting: Interventional Cardiology

## 2014-09-08 ENCOUNTER — Ambulatory Visit (INDEPENDENT_AMBULATORY_CARE_PROVIDER_SITE_OTHER): Payer: Medicare Other | Admitting: Interventional Cardiology

## 2014-09-08 VITALS — BP 122/68 | HR 74 | Ht 73.0 in | Wt 205.8 lb

## 2014-09-08 DIAGNOSIS — I2510 Atherosclerotic heart disease of native coronary artery without angina pectoris: Secondary | ICD-10-CM

## 2014-09-08 DIAGNOSIS — E782 Mixed hyperlipidemia: Secondary | ICD-10-CM | POA: Diagnosis not present

## 2014-09-08 DIAGNOSIS — E785 Hyperlipidemia, unspecified: Secondary | ICD-10-CM

## 2014-09-08 DIAGNOSIS — I158 Other secondary hypertension: Secondary | ICD-10-CM

## 2014-09-08 MED ORDER — LISINOPRIL 10 MG PO TABS: 20.0000 mg | ORAL_TABLET | Freq: Every day | ORAL | Status: DC

## 2014-09-08 MED ORDER — METOPROLOL SUCCINATE ER 50 MG PO TB24: 50.0000 mg | ORAL_TABLET | Freq: Every day | ORAL | Status: DC

## 2014-09-08 MED ORDER — PRAVASTATIN SODIUM 80 MG PO TABS: 80.0000 mg | ORAL_TABLET | Freq: Every day | ORAL | Status: DC

## 2014-09-08 MED ORDER — NITROGLYCERIN 0.4 MG SL SUBL: 0.4000 mg | SUBLINGUAL_TABLET | SUBLINGUAL | Status: DC | PRN

## 2014-09-08 NOTE — Patient Instructions (Signed)
Medication Instructions:  Stop taking Niasin  Labwork: Lipids and LFT's in November, 2016. Please do not eat or drink after midnight the night before labs are drawn.  Testing/Procedures: None  Follow-Up: Your physician wants you to follow-up in: 1 year. You will receive a reminder letter in the mail two months in advance. If you don't receive a letter, please call our office to schedule the follow-up appointment.

## 2014-09-08 NOTE — Progress Notes (Signed)
Patient ID: Billy Knox, male   DOB: December 25, 1940, 74 y.o.   MRN: [id number]     Cardiology Office Note   Date:  09/09/2014   ID:  Billy Knox, DOB 1940-11-24, MRN [id number]  PCP:  Gennette Pac, MD    No chief complaint on file.  follow-up coronary artery disease   Wt Readings from Last 3 Encounters:  09/08/14 205 lb 12.8 oz (93.35 kg)  08/12/14 200 lb (90.719 kg)  03/24/13 227 lb 12.8 oz (103.329 kg)       History of Present Illness: Billy Knox is a 74 y.o. male  with CAD, CTO of RCA diagnosed many years ago, medically managed. He has had gamma knife surgery for tic doloreux in 2013. He still has sx, phantom pains. He walks for 3 miles/hour x 1 hour, nearly daily. More in the summer. He has lost some weight. CAD/ASCVD:  Denies : Chest pain.  Dizziness.  Dyspnea on exertion.  Leg edema.  Nitroglycerin.  Palpitations.  Shortness of breath.  Syncope.   Walked nearly 1000 miles this year.  No cardiac sx.  Still does ballroom dancing as well. He has lost over 20 pounds in the past year.   Past Medical History  Diagnosis Date  . Coronary artery disease   . Hypertension   . Hyperchloremia   . Esophageal stricture   . Colon polyp   . Sacroiliac inflammation   . Trigeminal neuralgia   . Deafness in right ear   . Rt facial numbness   . Myocardial infarction     hx. silent MI - no intervention except oral meds 7 yrs ago  . GERD (gastroesophageal reflux disease)     past history no recent problems    Past Surgical History  Procedure Laterality Date  . Prostate biopsy    . Gamma knife      3 yrs ago [place] area around trigeminal nerve area"  . Esophagogastroduodenoscopy    . Esophagogastroduodenoscopy  06/14/2011    Procedure: ESOPHAGOGASTRODUODENOSCOPY (EGD);  Surgeon: Winfield Cunas., MD;  Location: [facility];  Service: Endoscopy;  Laterality: N/A;  . Savory dilation  06/14/2011    Procedure: SAVORY DILATION;  Surgeon: Winfield Cunas., MD;  Location: [facility];  Service: Endoscopy;  Laterality: N/A;  need xray  . Trigeminal nerve balloon decompression Right     right ear deafness  . Tonsillectomy       child  . Cystoscopy    . Esophagogastroduodenoscopy (egd) with propofol N/A 08/12/2014    Procedure: ESOPHAGOGASTRODUODENOSCOPY (EGD) WITH PROPOFOL;  Surgeon: Laurence Spates, MD;  Location: [facility];  Service: Endoscopy;  Laterality: N/A;  . Savory dilation N/A 08/12/2014    Procedure: SAVORY DILATION;  Surgeon: Laurence Spates, MD;  Location: [facility];  Service: Endoscopy;  Laterality: N/A;     Current Outpatient Prescriptions  Medication Sig Dispense Refill  . acetaminophen (TYLENOL) 500 MG tablet Take 1,000 mg by mouth every 6 (six) hours as needed for mild pain.    Marland Kitchen aspirin 81 MG tablet Take 81 mg by mouth daily.    . finasteride (PROSCAR) 5 MG tablet Take 5 mg by mouth daily.    . Glucosamine HCl 1500 MG TABS Take 1 tablet by mouth daily.     Marland Kitchen lisinopril (PRINIVIL,ZESTRIL) 10 MG tablet Take 2 tablets (20 mg total) by mouth daily. 180 tablet 3  . metoprolol succinate (TOPROL-XL) 50 MG 24 hr tablet Take 1 tablet (50 mg total)  by mouth daily. 90 tablet 3  . Multiple Vitamin (MULTIVITAMIN WITH MINERALS) TABS tablet Take 1 tablet by mouth daily.    . nitroGLYCERIN (NITROSTAT) 0.4 MG SL tablet Place 1 tablet (0.4 mg total) under the tongue every 5 (five) minutes as needed. 90 tablet 1  . Omega-3 Fatty Acids (FISH OIL) 1200 MG CAPS Take 2,400 mg by mouth 2 (two) times daily.    Marland Kitchen OVER THE COUNTER MEDICATION Take 1 tablet by mouth daily. Med Name: PRILOSEC    . pravastatin (PRAVACHOL) 80 MG tablet Take 1 tablet (80 mg total) by mouth daily. 90 tablet 3   No current facility-administered medications for this visit.    Allergies:   Review of patient's allergies indicates no known allergies.    Social History:  The patient  reports that he quit smoking about 47 years ago. He has never used smokeless tobacco.  He reports that he drinks about 0.6 oz of alcohol per week. He reports that he does not use illicit drugs.   Family History:  The patient's family history includes Colon cancer in his mother.    ROS:  Please see the history of present illness.   Otherwise, review of systems are positive for hearing loss.   All other systems are reviewed and negative.    PHYSICAL EXAM: VS:  BP 122/68 mmHg  Pulse 74  Ht 6\' 1"  (1.854 m)  Wt 205 lb 12.8 oz (93.35 kg)  BMI 27.16 kg/m2 , BMI Body mass index is 27.16 kg/(m^2). GEN: Well nourished, well developed, in no acute distress HEENT: normal Neck: no JVD, carotid bruits, or masses Cardiac: RRR; no murmurs, rubs, or gallops,no edema  Respiratory:  clear to auscultation bilaterally, normal work of breathing GI: soft, nontender, nondistended, + BS MS: no deformity or atrophy Skin: warm and dry, no rash Neuro:  Strength and sensation are intact Psych: euthymic mood, full affect   EKG:   The ekg ordered today demonstrates NSR, inferior MI   Recent Labs: 10/25/2013: ALT 22   Lipid Panel    Component Value Date/Time   CHOL 139 10/25/2013 1046   TRIG 159.0* 10/25/2013 1046   HDL 33.20* 10/25/2013 1046   CHOLHDL 4 10/25/2013 1046   VLDL 31.8 10/25/2013 1046   LDLCALC 74 10/25/2013 1046     Other studies Reviewed: Additional studies/ records that were reviewed today with results demonstrating: .   ASSESSMENT AND PLAN:  1. CAD: Chronic total occlusion of the RCA. No angina. No indication for revascularization at this time. Continue aggressive secondary prevention. Refill cardiac medications. 2. Hyperlipidemia: LDL 74 in 10/15.  His Niaspan has become very expensive. I instructed him to continue this medication until his current supply runs out. A few months after that, we can recheck his lipids. Hopefully, with his lifestyle changes, his triglycerides will remain well controlled off of the Niaspan. 3. Obesity: I congratulated him on his weight  loss. I encouraged him to continue to try losing weight with the same lifestyle modifications. 4. Hypertension: Blood pressure well controlled   Current medicines are reviewed at length with the patient today.  The patient concerns regarding his medicines were addressed.  The following changes have been made:  Stop Niaspan  Labs/ tests ordered today include:   Orders Placed This Encounter  Procedures  . Lipid Profile  . Hepatic function panel  . EKG 12-Lead    Recommend 150 minutes/week of aerobic exercise Low fat, low carb, high fiber diet recommended  Disposition:  FU in one year   SignedJettie Booze., MD  09/09/2014 12:10 PM    [facility] [address], [place], [place]  [address] Phone: [phone]; Fax: [phone]

## 2014-11-25 ENCOUNTER — Encounter: Payer: Self-pay | Admitting: Interventional Cardiology

## 2014-11-28 ENCOUNTER — Other Ambulatory Visit: Payer: Medicare Other

## 2014-12-19 ENCOUNTER — Encounter (HOSPITAL_COMMUNITY): Payer: Self-pay | Admitting: Family Medicine

## 2014-12-19 ENCOUNTER — Emergency Department (HOSPITAL_COMMUNITY)
Admission: EM | Admit: 2014-12-19 | Discharge: 2014-12-19 | Disposition: A | Discharge status: Home / Self Care | Payer: Medicare Other | Attending: Emergency Medicine | Admitting: Emergency Medicine

## 2014-12-19 ENCOUNTER — Emergency Department (HOSPITAL_COMMUNITY): Payer: Medicare Other

## 2014-12-19 DIAGNOSIS — H9191 Unspecified hearing loss, right ear: Secondary | ICD-10-CM | POA: Insufficient documentation

## 2014-12-19 DIAGNOSIS — I252 Old myocardial infarction: Secondary | ICD-10-CM | POA: Insufficient documentation

## 2014-12-19 DIAGNOSIS — Z86010 Personal history of colonic polyps: Secondary | ICD-10-CM | POA: Diagnosis not present

## 2014-12-19 DIAGNOSIS — K219 Gastro-esophageal reflux disease without esophagitis: Secondary | ICD-10-CM | POA: Diagnosis not present

## 2014-12-19 DIAGNOSIS — I10 Essential (primary) hypertension: Secondary | ICD-10-CM | POA: Insufficient documentation

## 2014-12-19 DIAGNOSIS — I2510 Atherosclerotic heart disease of native coronary artery without angina pectoris: Secondary | ICD-10-CM | POA: Diagnosis not present

## 2014-12-19 DIAGNOSIS — Z7982 Long term (current) use of aspirin: Secondary | ICD-10-CM | POA: Diagnosis not present

## 2014-12-19 DIAGNOSIS — Z79899 Other long term (current) drug therapy: Secondary | ICD-10-CM | POA: Insufficient documentation

## 2014-12-19 DIAGNOSIS — R079 Chest pain, unspecified: Secondary | ICD-10-CM

## 2014-12-19 LAB — BASIC METABOLIC PANEL
Anion gap: 5 (ref 5–15)
BUN: 8 mg/dL (ref 6–20)
CALCIUM: 10 mg/dL (ref 8.9–10.3)
CO2: 31 mmol/L (ref 22–32)
CREATININE: 0.83 mg/dL (ref 0.61–1.24)
Chloride: 104 mmol/L (ref 101–111)
GFR calc Af Amer: >60 mL/min (ref >60)
GFR calc non Af Amer: >60 mL/min (ref >60)
Glucose, Bld: 108 mg/dL — ABNORMAL HIGH (ref 65–99)
Potassium: 4.4 mmol/L (ref 3.5–5.1)
Sodium: 140 mmol/L (ref 135–145)

## 2014-12-19 LAB — CBC
HCT: 46.3 % (ref 39.0–52.0)
Hemoglobin: 16 g/dL (ref 13.0–17.0)
MCH: 31.6 pg (ref 26.0–34.0)
MCHC: 34.6 g/dL (ref 30.0–36.0)
MCV: 91.3 fL (ref 78.0–100.0)
Platelets: 185 10*3/uL (ref 150–400)
RBC: 5.07 MIL/uL (ref 4.22–5.81)
RDW: 12.4 % (ref 11.5–15.5)
WBC: 7.1 10*3/uL (ref 4.0–10.5)

## 2014-12-19 LAB — I-STAT TROPONIN, ED
Troponin i, poc: 0.01 ng/mL (ref 0.00–0.08)
Troponin i, poc: 0 ng/mL (ref 0.00–0.08)

## 2014-12-19 LAB — D-DIMER, QUANTITATIVE (NOT AT ARMC)

## 2014-12-19 MED ORDER — NITROGLYCERIN 0.4 MG SL SUBL: 0.4000 mg | SUBLINGUAL_TABLET | SUBLINGUAL | Status: DC | PRN

## 2014-12-19 NOTE — ED Provider Notes (Signed)
CSN: [id number]     Arrival date & time 12/19/14  1128 History   First MD Initiated Contact with Patient 12/19/14 1555     Chief Complaint  Patient presents with  . Chest Pain     (Consider location/radiation/quality/duration/timing/severity/associated sxs/prior Treatment) HPI  74 year old male with a history of CAD with chronic RCA occlusion  Presents with chest pain that started when he first woke up at around 7 AM. Pain seems to be intermittent and feels like a soreness and occasionally sharp pain in the middle of his chest. Does not radiate. No shortness of breath. He has not had any coughing but it almost feels like his chest is sore from coughing frequently. Hurts worse with certain types of movement including rotation of his chest or bending over to tie his shoes. Breathing in deep also makes it worse. He walks about 3 miles per day and when he did this today he noticed no increase in pain and no shortness of breath on exertion. No leg swelling. He had an MI 10 years ago but states it was a silent MI and does not remember any pain. Note diaphoresis, nausea, or vomiting.  Past Medical History  Diagnosis Date  . Coronary artery disease   . Hypertension   . Hyperchloremia   . Esophageal stricture   . Colon polyp   . Sacroiliac inflammation ([place])   . Trigeminal neuralgia   . Deafness in right ear   . Rt facial numbness   . Myocardial infarction ([place])     hx. silent MI - no intervention except oral meds 7 yrs ago  . GERD (gastroesophageal reflux disease)     past history no recent problems   Past Surgical History  Procedure Laterality Date  . Prostate biopsy    . Gamma knife      3 yrs ago [place] area around trigeminal nerve area"  . Esophagogastroduodenoscopy    . Esophagogastroduodenoscopy  06/14/2011    Procedure: ESOPHAGOGASTRODUODENOSCOPY (EGD);  Surgeon: Winfield Cunas., MD;  Location: [facility];  Service: Endoscopy;  Laterality: N/A;  . Savory dilation   06/14/2011    Procedure: SAVORY DILATION;  Surgeon: Winfield Cunas., MD;  Location: [facility];  Service: Endoscopy;  Laterality: N/A;  need xray  . Trigeminal nerve balloon decompression Right     right ear deafness  . Tonsillectomy       child  . Cystoscopy    . Esophagogastroduodenoscopy (egd) with propofol N/A 08/12/2014    Procedure: ESOPHAGOGASTRODUODENOSCOPY (EGD) WITH PROPOFOL;  Surgeon: Laurence Spates, MD;  Location: [facility];  Service: Endoscopy;  Laterality: N/A;  . Savory dilation N/A 08/12/2014    Procedure: SAVORY DILATION;  Surgeon: Laurence Spates, MD;  Location: [facility];  Service: Endoscopy;  Laterality: N/A;   Family History  Problem Relation Age of Onset  . Colon cancer Mother    Social History  Substance Use Topics  . Smoking status: Former Smoker    Quit date: 06/14/1967  . Smokeless tobacco: Never Used  . Alcohol Use: 0.6 oz/week    1 Standard drinks or equivalent per week     Comment: rare    Review of Systems  Constitutional: Negative for fever and diaphoresis.  Respiratory: Negative for cough and shortness of breath.   Cardiovascular: Positive for chest pain. Negative for leg swelling.  Gastrointestinal: Negative for nausea and vomiting.  All other systems reviewed and are negative.     Allergies  Review of patient's allergies indicates  no known allergies.  Home Medications   Prior to Admission medications   Medication Sig Start Date End Date Taking? Authorizing Provider  acetaminophen (TYLENOL) 500 MG tablet Take 1,000 mg by mouth every 6 (six) hours as needed for mild pain.    Historical Provider, MD  aspirin 81 MG tablet Take 81 mg by mouth daily.    Historical Provider, MD  finasteride (PROSCAR) 5 MG tablet Take 5 mg by mouth daily.    Historical Provider, MD  Glucosamine HCl 1500 MG TABS Take 1 tablet by mouth daily.     Historical Provider, MD  lisinopril (PRINIVIL,ZESTRIL) 10 MG tablet Take 2 tablets (20 mg total) by mouth daily.  09/08/14   Jettie Booze, MD  metoprolol succinate (TOPROL-XL) 50 MG 24 hr tablet Take 1 tablet (50 mg total) by mouth daily. 09/08/14   Jettie Booze, MD  Multiple Vitamin (MULTIVITAMIN WITH MINERALS) TABS tablet Take 1 tablet by mouth daily.    Historical Provider, MD  nitroGLYCERIN (NITROSTAT) 0.4 MG SL tablet Place 1 tablet (0.4 mg total) under the tongue every 5 (five) minutes as needed. 09/08/14   Jettie Booze, MD  Omega-3 Fatty Acids (FISH OIL) 1200 MG CAPS Take 2,400 mg by mouth 2 (two) times daily.    Historical Provider, MD  OVER THE COUNTER MEDICATION Take 1 tablet by mouth daily. Med Name: PRILOSEC    Historical Provider, MD  pravastatin (PRAVACHOL) 80 MG tablet Take 1 tablet (80 mg total) by mouth daily. 09/08/14   Jettie Booze, MD   BP 156/94 mmHg  Pulse 73  Temp(Src) 98.1 F (36.7 C) (Oral)  Resp 18  SpO2 98% Physical Exam  Constitutional: He is oriented to person, place, and time. He appears well-developed and well-nourished.  HENT:  Head: Normocephalic and atraumatic.  Right Ear: External ear normal.  Left Ear: External ear normal.  Nose: Nose normal.  Eyes: Right eye exhibits no discharge. Left eye exhibits no discharge.  Neck: Neck supple.  Cardiovascular: Normal rate, regular rhythm, normal heart sounds and intact distal pulses.   Pulmonary/Chest: Effort normal and breath sounds normal. He exhibits no tenderness.  Abdominal: Soft. He exhibits no distension. There is no tenderness.  Musculoskeletal: He exhibits no edema.  Neurological: He is alert and oriented to person, place, and time.  Skin: Skin is warm and dry.  Nursing note and vitals reviewed.   ED Course  Procedures (including critical care time) Labs Review Labs Reviewed  BASIC METABOLIC PANEL - Abnormal; Notable for the following:    Glucose, Bld 108 (*)    All other components within normal limits  CBC  D-DIMER, QUANTITATIVE (NOT AT [facility])  Randolm Idol, ED  Randolm Idol, ED    Imaging Review Dg Chest 2 View  12/19/2014  CLINICAL DATA:  Feels like pulled muscle in chest since this morning. Chest pain. EXAM: CHEST  2 VIEW COMPARISON:  None. FINDINGS: The heart size and mediastinal contours are within normal limits. Both lungs are clear. The visualized skeletal structures are unremarkable. IMPRESSION: No active cardiopulmonary disease. Electronically Signed   By: Rolm Baptise M.D.   On: 12/19/2014 13:24   I have personally reviewed and evaluated these images and lab results as part of my medical decision-making.   EKG Interpretation   Date/Time:  Monday December 19 2014 11:35:17 EST Ventricular Rate:  69 PR Interval:  194 QRS Duration: 96 QT Interval:  394 QTC Calculation: 422 R Axis:   55 Text Interpretation:  Normal sinus rhythm no acute ST/T changes No old  tracing to compare Confirmed by Rossanna Spitzley  MD, [place] [phone]) on 12/19/2014  3:56:48 PM       EKG Interpretation  Date/Time:  Monday December 19 2014 16:05:18 EST Ventricular Rate:  67 PR Interval:  197 QRS Duration: 98 QT Interval:  393 QTC Calculation: 415 R Axis:   26 Text Interpretation:  Sinus rhythm Consider left atrial enlargement Abnormal R-wave progression, early transition Abnormal inferior Q waves no significant change since earlier in the day Confirmed by Fredrich Cory  MD, Napolean Sia ([id number]) on 12/19/2014 4:11:44 PM       MDM   Final diagnoses:  Chest pain, unspecified chest pain type    Patient's pain is atypical. He does have CAD with a chronic RCA occlusion. However has benign ECGs with no progression. 2 negative troponins. Low suspicion for pulmonary embolism, and has a negative d-dimer. At this point I feel that this is unlikely to be ACS given no exertional component and the atypical presentation of his symptoms. Discussed patient with Dr. Burt Knack who agrees with further workup as outpatient, will f/u closely with his cardiologist.    Sherwood Gambler, MD 12/19/14 [phone]

## 2014-12-19 NOTE — ED Notes (Signed)
Pt here for central chest pain that hurst more with movement and breathing. sts it feels like a catch and noticed it when she bent over to tie his shoe. Denies SOB.

## 2015-02-17 DIAGNOSIS — H2511 Age-related nuclear cataract, right eye: Secondary | ICD-10-CM | POA: Diagnosis not present

## 2015-02-17 DIAGNOSIS — H2512 Age-related nuclear cataract, left eye: Secondary | ICD-10-CM | POA: Diagnosis not present

## 2015-02-17 DIAGNOSIS — H40013 Open angle with borderline findings, low risk, bilateral: Secondary | ICD-10-CM | POA: Diagnosis not present

## 2015-02-17 DIAGNOSIS — H25012 Cortical age-related cataract, left eye: Secondary | ICD-10-CM | POA: Diagnosis not present

## 2015-02-17 DIAGNOSIS — H25011 Cortical age-related cataract, right eye: Secondary | ICD-10-CM | POA: Diagnosis not present

## 2015-03-07 DIAGNOSIS — H2511 Age-related nuclear cataract, right eye: Secondary | ICD-10-CM | POA: Diagnosis not present

## 2015-03-22 DIAGNOSIS — H25012 Cortical age-related cataract, left eye: Secondary | ICD-10-CM | POA: Diagnosis not present

## 2015-03-22 DIAGNOSIS — H2512 Age-related nuclear cataract, left eye: Secondary | ICD-10-CM | POA: Diagnosis not present

## 2015-03-28 DIAGNOSIS — H2512 Age-related nuclear cataract, left eye: Secondary | ICD-10-CM | POA: Diagnosis not present

## 2015-05-30 DIAGNOSIS — I252 Old myocardial infarction: Secondary | ICD-10-CM | POA: Diagnosis not present

## 2015-05-30 DIAGNOSIS — Z923 Personal history of irradiation: Secondary | ICD-10-CM | POA: Diagnosis not present

## 2015-05-30 DIAGNOSIS — G500 Trigeminal neuralgia: Secondary | ICD-10-CM | POA: Diagnosis not present

## 2015-07-06 DIAGNOSIS — G9389 Other specified disorders of brain: Secondary | ICD-10-CM | POA: Diagnosis not present

## 2015-07-06 DIAGNOSIS — G500 Trigeminal neuralgia: Secondary | ICD-10-CM | POA: Diagnosis not present

## 2015-08-09 DIAGNOSIS — R972 Elevated prostate specific antigen [PSA]: Secondary | ICD-10-CM | POA: Diagnosis not present

## 2015-08-16 DIAGNOSIS — R972 Elevated prostate specific antigen [PSA]: Secondary | ICD-10-CM | POA: Diagnosis not present

## 2015-08-31 DIAGNOSIS — H40013 Open angle with borderline findings, low risk, bilateral: Secondary | ICD-10-CM | POA: Diagnosis not present

## 2015-08-31 DIAGNOSIS — H04123 Dry eye syndrome of bilateral lacrimal glands: Secondary | ICD-10-CM | POA: Diagnosis not present

## 2015-09-07 ENCOUNTER — Other Ambulatory Visit: Payer: Self-pay | Admitting: *Deleted

## 2015-09-07 DIAGNOSIS — G501 Atypical facial pain: Secondary | ICD-10-CM | POA: Diagnosis not present

## 2015-09-07 MED ORDER — LISINOPRIL 10 MG PO TABS: 20.0000 mg | ORAL_TABLET | Freq: Every day | ORAL | 0 refills | Status: DC

## 2015-09-08 ENCOUNTER — Other Ambulatory Visit: Payer: Medicare Other | Admitting: *Deleted

## 2015-09-08 DIAGNOSIS — E785 Hyperlipidemia, unspecified: Secondary | ICD-10-CM

## 2015-09-08 LAB — HEPATIC FUNCTION PANEL
ALT: 17 U/L (ref 9–46)
AST: 16 U/L (ref 10–35)
Albumin: 4.1 g/dL (ref 3.6–5.1)
Alkaline Phosphatase: 38 U/L — ABNORMAL LOW (ref 40–115)
BILIRUBIN DIRECT: 0.1 mg/dL (ref <=0.2)
BILIRUBIN INDIRECT: 0.6 mg/dL (ref 0.2–1.2)
BILIRUBIN TOTAL: 0.7 mg/dL (ref 0.2–1.2)
Total Protein: 5.9 g/dL — ABNORMAL LOW (ref 6.1–8.1)

## 2015-09-08 LAB — LIPID PANEL
CHOL/HDL RATIO: 3.9 ratio (ref <=5.0)
CHOLESTEROL: 131 mg/dL (ref 125–200)
HDL: 34 mg/dL — ABNORMAL LOW (ref >=40)
LDL Cholesterol: 69 mg/dL (ref <130)
Triglycerides: 141 mg/dL (ref <150)
VLDL: 28 mg/dL (ref <30)

## 2015-09-08 NOTE — Addendum Note (Signed)
Addended by: Eulis Foster on: 09/08/2015 07:34 AM   Modules accepted: Orders

## 2015-09-14 ENCOUNTER — Encounter: Payer: Self-pay | Admitting: *Deleted

## 2015-10-01 NOTE — Progress Notes (Signed)
Patient Billy Knox: Billy Knox, male   DOB: 1940-06-17, 75 y.o.   MRN: [id number]     Cardiology Office Note   Date:  10/02/2015   Billy Knox:  Billy Knox, DOB 1940/06/03, MRN [id number]  PCP:  Gennette Pac, MD    No chief complaint on file.  follow-up coronary artery disease   Wt Readings from Last 3 Encounters:  10/02/15 224 lb 12.8 oz (102 kg)  09/08/14 205 lb 12.8 oz (93.4 kg)  08/12/14 200 lb (90.7 kg)       History of Present Illness: Billy Knox is a 75 y.o. male  with CAD, CTO of RCA diagnosed many years ago, Left to right collaterals, medically managed. He has had gamma knife surgery for tic doloreux in 2013. He still has sx, phantom pains. He walks for 3 miles/hour x almost 2 hours daily, nearly daily. He completes 10,o000 steps almost daily.  He walks up hills without sx.  CAD/ASCVD:  Denies : Chest pain.  Dizziness.  Dyspnea on exertion.  Leg edema.  Nitroglycerin.  Palpitations.  Shortness of breath.  Syncope.   Walked nearly 1350 miles this year.  No cardiac sx.  Still does ballroom dancing as well. He has lost over 20 pounds in  2016.  He gained some of the weight back due to dietary indiscretion.  He had  An episode of chest pain in 12/16.  He had esophageal dilatation with relief of sx.  (Schatzki's ring).   Past Medical History:  Diagnosis Date  . Colon polyp   . Coronary artery disease   . Deafness in right ear   . Esophageal stricture   . GERD (gastroesophageal reflux disease)    past history no recent problems  . Hyperchloremia   . Hypertension   . Myocardial infarction ([place])    hx. silent MI - no intervention except oral meds 7 yrs ago  . Rt facial numbness   . 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 Trigeminal neuralgia     Past Surgical History:  Procedure Laterality Date  . CYSTOSCOPY    . ESOPHAGOGASTRODUODENOSCOPY    . ESOPHAGOGASTRODUODENOSCOPY  06/14/2011   Procedure: ESOPHAGOGASTRODUODENOSCOPY (EGD);  Surgeon: Winfield Cunas.,  MD;  Location: [facility];  Service: Endoscopy;  Laterality: N/A;  . ESOPHAGOGASTRODUODENOSCOPY (EGD) WITH PROPOFOL N/A 08/12/2014   Procedure: ESOPHAGOGASTRODUODENOSCOPY (EGD) WITH PROPOFOL;  Surgeon: Laurence Spates, MD;  Location: [facility];  Service: Endoscopy;  Laterality: N/A;  . gamma knife     3 yrs ago [place] area around trigeminal nerve area"  . PROSTATE BIOPSY    . SAVORY DILATION  06/14/2011   Procedure: SAVORY DILATION;  Surgeon: Winfield Cunas., MD;  Location: [facility];  Service: Endoscopy;  Laterality: N/A;  need xray  . SAVORY DILATION N/A 08/12/2014   Procedure: SAVORY DILATION;  Surgeon: Laurence Spates, MD;  Location: [facility];  Service: Endoscopy;  Laterality: N/A;  . TONSILLECTOMY      child  . TRIGEMINAL NERVE BALLOON DECOMPRESSION Right    right ear deafness     Current Outpatient Prescriptions  Medication Sig Dispense Refill  . acetaminophen (TYLENOL) 500 MG tablet Take 1,000 mg by mouth every 6 (six) hours as needed for mild pain.    Marland Kitchen aspirin 81 MG tablet Take 81 mg by mouth daily.    . finasteride (PROSCAR) 5 MG tablet Take 5 mg by mouth daily.    . Glucosamine HCl 1500 MG TABS Take 1 tablet by mouth daily.     Marland Kitchen  lisinopril (PRINIVIL,ZESTRIL) 10 MG tablet Take 2 tablets (20 mg total) by mouth daily. 60 tablet 0  . metoprolol succinate (TOPROL-XL) 50 MG 24 hr tablet Take 1 tablet (50 mg total) by mouth daily. 90 tablet 3  . Multiple Vitamin (MULTIVITAMIN WITH MINERALS) TABS tablet Take 1 tablet by mouth daily.    . nitroGLYCERIN (NITROSTAT) 0.4 MG SL tablet Place 0.4 mg under the tongue every 5 (five) minutes as needed for chest pain.    . Omega-3 Fatty Acids (FISH OIL) 1200 MG CAPS Take 2,400 mg by mouth 2 (two) times daily.    Marland Kitchen OVER THE COUNTER MEDICATION Take 1 tablet by mouth daily. Med Name: PRILOSEC    . pravastatin (PRAVACHOL) 80 MG tablet Take 1 tablet (80 mg total) by mouth daily. 90 tablet 3   No current facility-administered  medications for this visit.     Allergies:   Review of patient's allergies indicates no known allergies.    Social History:  The patient  reports that he quit smoking about 48 years ago. He has never used smokeless tobacco. He reports that he drinks about 0.6 oz of alcohol per week . He reports that he does not use drugs.   Family History:  The patient's family history includes Colon cancer in his mother.    ROS:  Please see the history of present illness.   Otherwise, review of systems are positive for hearing loss, face pain, joint pain.   All other systems are reviewed and negative.    PHYSICAL EXAM: VS:  BP 128/88   Pulse 80   Ht 6\' 1"  (1.854 m)   Wt 224 lb 12.8 oz (102 kg)   BMI 29.66 kg/m  , BMI Body mass index is 29.66 kg/m. GEN: Well nourished, well developed, in no acute distress  HEENT: normal  Neck: no JVD, carotid bruits, or masses Cardiac: RRR; no murmurs, rubs, or gallops,no edema  Respiratory:  clear to auscultation bilaterally, normal work of breathing GI: soft, nontender, nondistended, + BS MS: no deformity or atrophy  Skin: warm and dry, no rash Neuro:  Strength and sensation are intact Psych: euthymic mood, full affect   EKG:   The ekg ordered in 12/16 demonstrates NSR, inferior MI   Recent Labs: 12/19/2014: BUN 8; Creatinine, Ser 0.83; Hemoglobin 16.0; Platelets 185; Potassium 4.4; Sodium 140 09/08/2015: ALT 17   Lipid Panel    Component Value Date/Time   CHOL 131 09/08/2015 0734   TRIG 141 09/08/2015 0734   HDL 34 (L) 09/08/2015 0734   CHOLHDL 3.9 09/08/2015 0734   VLDL 28 09/08/2015 0734   LDLCALC 69 09/08/2015 0734     Other studies Reviewed: Additional studies/ records that were reviewed today with results demonstrating: old ECG, cath results.   ASSESSMENT AND PLAN:  1. CAD: Chronic total occlusion of the RCA. No angina. No indication for revascularization at this time. Continue aggressive secondary prevention. Refill cardiac  medications. 2. Hyperlipidemia: LDL 74 in 10/15.  Lipids are well controlled in 2017 even off of Niaspan.  Continue fish oil and pravastatin. 3. Obesity: I congratulated him on his weight loss. I encouraged him to continue to try losing weight with the same lifestyle modifications. 4. Hypertension: Blood pressure well controlled. 5. Preoperative:  He is looking to have Nucleus Caudalis Dorsal root entry zone, for facial pain from trigeminal neuralgia.  Dr.  Leonia Corona.  Given that he is very active walking, going up hills and walking nearly 5 miles a day,  no further cardiac testing is needed before surgery. He has not had angina.   Current medicines are reviewed at length with the patient today.  The patient concerns regarding his medicines were addressed.  The following changes have been made:    Labs/ tests ordered today include:   No orders of the defined types were placed in this encounter.   Recommend 150 minutes/week of aerobic exercise Low fat, low carb, high fiber diet recommended  Disposition:   FU in one year   Signed, Larae Grooms, MD  10/02/2015 8:33 AM    [facility] [place], [place], [place]  [address] Phone: [phone]; Fax: [phone]

## 2015-10-02 ENCOUNTER — Encounter: Payer: Self-pay | Admitting: Interventional Cardiology

## 2015-10-02 ENCOUNTER — Encounter (INDEPENDENT_AMBULATORY_CARE_PROVIDER_SITE_OTHER): Payer: Self-pay

## 2015-10-02 ENCOUNTER — Ambulatory Visit (INDEPENDENT_AMBULATORY_CARE_PROVIDER_SITE_OTHER): Payer: Medicare Other | Admitting: Interventional Cardiology

## 2015-10-02 VITALS — BP 128/88 | HR 80 | Ht 73.0 in | Wt 224.8 lb

## 2015-10-02 DIAGNOSIS — I2510 Atherosclerotic heart disease of native coronary artery without angina pectoris: Secondary | ICD-10-CM

## 2015-10-02 DIAGNOSIS — E878 Other disorders of electrolyte and fluid balance, not elsewhere classified: Secondary | ICD-10-CM

## 2015-10-02 DIAGNOSIS — G500 Trigeminal neuralgia: Secondary | ICD-10-CM | POA: Diagnosis not present

## 2015-10-02 DIAGNOSIS — I10 Essential (primary) hypertension: Secondary | ICD-10-CM

## 2015-10-02 DIAGNOSIS — Z01810 Encounter for preprocedural cardiovascular examination: Secondary | ICD-10-CM

## 2015-10-02 MED ORDER — LISINOPRIL 10 MG PO TABS: 20.0000 mg | ORAL_TABLET | Freq: Every day | ORAL | 3 refills | Status: DC

## 2015-10-02 MED ORDER — PRAVASTATIN SODIUM 80 MG PO TABS: 80.0000 mg | ORAL_TABLET | Freq: Every day | ORAL | 3 refills | Status: DC

## 2015-10-02 MED ORDER — NITROGLYCERIN 0.4 MG SL SUBL: 0.4000 mg | SUBLINGUAL_TABLET | SUBLINGUAL | 6 refills | Status: DC | PRN

## 2015-10-02 MED ORDER — METOPROLOL SUCCINATE ER 50 MG PO TB24: 50.0000 mg | ORAL_TABLET | Freq: Every day | ORAL | 3 refills | Status: DC

## 2015-10-02 NOTE — Patient Instructions (Signed)
Medication Instructions:  Same-no changes  Labwork: None  Testing/Procedures: None  Follow-Up: Your physician wants you to follow-up in: 1 year.You will receive a reminder letter in the mail two months in advance. If you don't receive a letter, please call our office to schedule the follow-up appointment.   Any Other Special Instructions Will Be Listed Below (If Applicable).     If you need a refill on your cardiac medications before your next appointment, please call your pharmacy.   

## 2015-10-05 DIAGNOSIS — G501 Atypical facial pain: Secondary | ICD-10-CM | POA: Diagnosis not present

## 2015-10-05 DIAGNOSIS — G500 Trigeminal neuralgia: Secondary | ICD-10-CM | POA: Diagnosis not present

## 2015-11-30 DIAGNOSIS — Z0000 Encounter for general adult medical examination without abnormal findings: Secondary | ICD-10-CM | POA: Diagnosis not present

## 2015-11-30 DIAGNOSIS — G500 Trigeminal neuralgia: Secondary | ICD-10-CM | POA: Diagnosis not present

## 2015-11-30 DIAGNOSIS — R972 Elevated prostate specific antigen [PSA]: Secondary | ICD-10-CM | POA: Diagnosis not present

## 2015-11-30 DIAGNOSIS — I25119 Atherosclerotic heart disease of native coronary artery with unspecified angina pectoris: Secondary | ICD-10-CM | POA: Diagnosis not present

## 2015-11-30 DIAGNOSIS — Z86010 Personal history of colonic polyps: Secondary | ICD-10-CM | POA: Diagnosis not present

## 2015-11-30 DIAGNOSIS — K219 Gastro-esophageal reflux disease without esophagitis: Secondary | ICD-10-CM | POA: Diagnosis not present

## 2015-11-30 DIAGNOSIS — Z23 Encounter for immunization: Secondary | ICD-10-CM | POA: Diagnosis not present

## 2015-11-30 DIAGNOSIS — I10 Essential (primary) hypertension: Secondary | ICD-10-CM | POA: Diagnosis not present

## 2015-11-30 DIAGNOSIS — E782 Mixed hyperlipidemia: Secondary | ICD-10-CM | POA: Diagnosis not present

## 2015-12-06 DIAGNOSIS — Z5181 Encounter for therapeutic drug level monitoring: Secondary | ICD-10-CM | POA: Diagnosis not present

## 2015-12-06 DIAGNOSIS — H9191 Unspecified hearing loss, right ear: Secondary | ICD-10-CM | POA: Diagnosis not present

## 2015-12-06 DIAGNOSIS — M461 Sacroiliitis, not elsewhere classified: Secondary | ICD-10-CM | POA: Diagnosis not present

## 2015-12-06 DIAGNOSIS — Z79899 Other long term (current) drug therapy: Secondary | ICD-10-CM | POA: Diagnosis not present

## 2015-12-06 DIAGNOSIS — I2510 Atherosclerotic heart disease of native coronary artery without angina pectoris: Secondary | ICD-10-CM | POA: Diagnosis not present

## 2015-12-06 DIAGNOSIS — E782 Mixed hyperlipidemia: Secondary | ICD-10-CM | POA: Diagnosis not present

## 2015-12-06 DIAGNOSIS — G501 Atypical facial pain: Secondary | ICD-10-CM | POA: Diagnosis not present

## 2015-12-06 DIAGNOSIS — I10 Essential (primary) hypertension: Secondary | ICD-10-CM | POA: Diagnosis not present

## 2015-12-14 DIAGNOSIS — G501 Atypical facial pain: Secondary | ICD-10-CM | POA: Diagnosis not present

## 2015-12-25 DIAGNOSIS — G501 Atypical facial pain: Secondary | ICD-10-CM | POA: Diagnosis not present

## 2016-01-02 DIAGNOSIS — R51 Headache: Secondary | ICD-10-CM | POA: Diagnosis not present

## 2016-01-02 DIAGNOSIS — G40219 Localization-related (focal) (partial) symptomatic epilepsy and epileptic syndromes with complex partial seizures, intractable, without status epilepticus: Secondary | ICD-10-CM | POA: Diagnosis not present

## 2016-01-02 DIAGNOSIS — G8929 Other chronic pain: Secondary | ICD-10-CM | POA: Diagnosis not present

## 2016-01-02 DIAGNOSIS — F064 Anxiety disorder due to known physiological condition: Secondary | ICD-10-CM | POA: Diagnosis not present

## 2016-02-21 DIAGNOSIS — L821 Other seborrheic keratosis: Secondary | ICD-10-CM | POA: Diagnosis not present

## 2016-02-21 DIAGNOSIS — L570 Actinic keratosis: Secondary | ICD-10-CM | POA: Diagnosis not present

## 2016-02-24 ENCOUNTER — Ambulatory Visit (INDEPENDENT_AMBULATORY_CARE_PROVIDER_SITE_OTHER): Payer: Medicare Other

## 2016-02-24 ENCOUNTER — Ambulatory Visit (INDEPENDENT_AMBULATORY_CARE_PROVIDER_SITE_OTHER): Payer: Medicare Other | Admitting: Physician Assistant

## 2016-02-24 ENCOUNTER — Encounter: Payer: Self-pay | Admitting: Physician Assistant

## 2016-02-24 VITALS — BP 132/84 | HR 82 | Temp 97.7°F | Resp 18

## 2016-02-24 DIAGNOSIS — S199XXA Unspecified injury of neck, initial encounter: Secondary | ICD-10-CM | POA: Diagnosis not present

## 2016-02-24 DIAGNOSIS — M25512 Pain in left shoulder: Secondary | ICD-10-CM

## 2016-02-24 DIAGNOSIS — S4992XA Unspecified injury of left shoulder and upper arm, initial encounter: Secondary | ICD-10-CM | POA: Diagnosis not present

## 2016-02-24 MED ORDER — CYCLOBENZAPRINE HCL 5 MG PO TABS: 5.0000 mg | ORAL_TABLET | Freq: Three times a day (TID) | ORAL | 1 refills | Status: DC | PRN

## 2016-02-24 MED ORDER — TRAMADOL HCL 50 MG PO TABS: 25.0000 mg | ORAL_TABLET | Freq: Three times a day (TID) | ORAL | 0 refills | Status: DC | PRN

## 2016-02-24 NOTE — Patient Instructions (Addendum)
Come back in 5 days so I can re-examine your shoulder.  Keep the sling on.  Take tylenol along with the prescriptions I have provided.      IF you received an x-ray today, you will receive an invoice from [facility]. Please contact [facility] at [phone] with questions or concerns regarding your invoice.   IF you received labwork today, you will receive an invoice from [facility]. Please contact [facility] at [phone] with questions or concerns regarding your invoice.   Our billing staff will not be able to assist you with questions regarding bills from these companies.  You will be contacted with the lab results as soon as they are available. The fastest way to get your results is to activate your My Chart account. Instructions are located on the last page of this paperwork. If you have not heard from [place] regarding the results in 2 weeks, please contact this office.

## 2016-02-24 NOTE — Progress Notes (Signed)
02/24/2016 4:03 PM   DOB: [birth year] / MRN: [id number]  SUBJECTIVE:  Billy Knox is a 76 y.o. male presenting for left shoulder pain that started this morning "with a pop," that occurred while putting his shirt on.  He shoulder hurts so bad that he can not move his arm today.  He did have a fall backwards roughly 3 days ago but was able to get up without any difficulty.  He thinks his shoulder is dislocated.   He has No Known Allergies.   He  has a past medical history of Colon polyp; Coronary artery disease; Deafness in right ear; Esophageal stricture; GERD (gastroesophageal reflux disease); Hyperchloremia; Hypertension; Myocardial infarction; Rt facial numbness; Sacroiliac inflammation ([facility]); and Trigeminal neuralgia.    He  reports that he quit smoking about 48 years ago. He has never used smokeless tobacco. He reports that he drinks about 0.6 oz of alcohol per week . He reports that he does not use drugs. He  has no sexual activity history on file. The patient  has a past surgical history that includes Prostate biopsy; gamma knife; Esophagogastroduodenoscopy; Esophagogastroduodenoscopy (06/14/2011); Savory dilation (06/14/2011); Trigeminal nerve balloon decompression (Right); Tonsillectomy; Cystoscopy; Esophagogastroduodenoscopy (egd) with propofol (N/A, 08/12/2014); and Savory dilation (N/A, 08/12/2014).  His family history includes Colon cancer in his mother.  Review of Systems  Constitutional: Negative for diaphoresis.  Respiratory: Negative for shortness of breath.   Cardiovascular: Negative for chest pain.  Musculoskeletal: Positive for joint pain.  Neurological: Negative for dizziness.    The problem list and medications were reviewed and updated by myself where necessary and exist elsewhere in the encounter.   OBJECTIVE:  BP 132/84   Pulse 82   Temp 97.7 F (36.5 C)   Resp 18   SpO2 97%   Physical Exam  Cardiovascular: Normal rate and regular rhythm.   Pulmonary/Chest:  Effort normal and breath sounds normal.  Musculoskeletal:       Arms:   No results found for this or any previous visit (from the past 72 hour(s)).  Dg Cervical Spine 2 Or 3 Views  Result Date: 02/24/2016 CLINICAL DATA:  Left shoulder pain status post fall. Status post neck surgery November 2017. EXAM: CERVICAL SPINE - 2-3 VIEW COMPARISON:  None. FINDINGS: There is no evidence of cervical spine fracture or prevertebral soft tissue swelling. Alignment is normal. No other significant bone abnormalities are identified. Mild degenerative disc disease with disc height loss at C4-5 and C5-6. Degenerative disc disease with significant disc height loss at C6-7. IMPRESSION: No acute osseous injury of the cervical spine. Electronically Signed   By: Kathreen Devoid   On: 02/24/2016 15:46   Dg Shoulder Left  Result Date: 02/24/2016 CLINICAL DATA:  Fall on Thursday.  Pain.  Felt a pop this morning. EXAM: LEFT SHOULDER - 2+ VIEW COMPARISON:  None. FINDINGS: No acute bony abnormality. Specifically, no fracture, subluxation, or dislocation. Soft tissues are intact. Joint spaces maintained. IMPRESSION: No acute bony abnormality. Electronically Signed   By: Rolm Baptise M.D.   On: 02/24/2016 15:45   Lab Results  Component Value Date   CREATININE 0.83 12/19/2014   Lab Results  Component Value Date   ALT 17 09/08/2015   AST 16 09/08/2015   ALKPHOS 38 (L) 09/08/2015   BILITOT 0.7 09/08/2015     ASSESSMENT AND PLAN:  Billy Knox was seen today for shoulder injury.  Diagnoses and all orders for this visit:  Acute pain of left shoulder: Rads negative.  He is  guarding his shoulder and a thorough is not possible today. I will place his arm in a sling, treat his pain, and see him back in 4-5 days to perform a complete shoulder exam.  He and his wife are agreeable to this plan.  -     DG Shoulder Left; Future -     DG Cervical Spine 2 or 3 views; Future    The patient is advised to call or return to clinic if he  does not see an improvement in symptoms, or to seek the care of the closest emergency department if he worsens with the above plan.   Philis Fendt, MHS, PA-C [facility] 02/24/2016 4:03 PM

## 2016-02-26 ENCOUNTER — Emergency Department (HOSPITAL_COMMUNITY)
Admission: EM | Admit: 2016-02-26 | Discharge: 2016-02-26 | Disposition: A | Discharge status: Home / Self Care | Payer: Medicare Other | Attending: Emergency Medicine | Admitting: Emergency Medicine

## 2016-02-26 ENCOUNTER — Telehealth: Payer: Self-pay

## 2016-02-26 ENCOUNTER — Emergency Department (HOSPITAL_COMMUNITY): Payer: Medicare Other

## 2016-02-26 ENCOUNTER — Encounter (HOSPITAL_COMMUNITY): Payer: Self-pay

## 2016-02-26 DIAGNOSIS — Y929 Unspecified place or not applicable: Secondary | ICD-10-CM | POA: Insufficient documentation

## 2016-02-26 DIAGNOSIS — Y999 Unspecified external cause status: Secondary | ICD-10-CM | POA: Diagnosis not present

## 2016-02-26 DIAGNOSIS — S01112A Laceration without foreign body of left eyelid and periocular area, initial encounter: Secondary | ICD-10-CM | POA: Insufficient documentation

## 2016-02-26 DIAGNOSIS — Z87891 Personal history of nicotine dependence: Secondary | ICD-10-CM | POA: Diagnosis not present

## 2016-02-26 DIAGNOSIS — S30811A Abrasion of abdominal wall, initial encounter: Secondary | ICD-10-CM | POA: Insufficient documentation

## 2016-02-26 DIAGNOSIS — S0240FA Zygomatic fracture, left side, initial encounter for closed fracture: Secondary | ICD-10-CM | POA: Diagnosis not present

## 2016-02-26 DIAGNOSIS — R55 Syncope and collapse: Secondary | ICD-10-CM | POA: Insufficient documentation

## 2016-02-26 DIAGNOSIS — W228XXA Striking against or struck by other objects, initial encounter: Secondary | ICD-10-CM | POA: Diagnosis not present

## 2016-02-26 DIAGNOSIS — S0282XA Fracture of other specified skull and facial bones, left side, initial encounter for closed fracture: Secondary | ICD-10-CM | POA: Insufficient documentation

## 2016-02-26 DIAGNOSIS — Z79899 Other long term (current) drug therapy: Secondary | ICD-10-CM | POA: Insufficient documentation

## 2016-02-26 DIAGNOSIS — W19XXXA Unspecified fall, initial encounter: Secondary | ICD-10-CM

## 2016-02-26 DIAGNOSIS — M542 Cervicalgia: Secondary | ICD-10-CM | POA: Diagnosis not present

## 2016-02-26 DIAGNOSIS — S199XXA Unspecified injury of neck, initial encounter: Secondary | ICD-10-CM | POA: Diagnosis not present

## 2016-02-26 DIAGNOSIS — S0232XA Fracture of orbital floor, left side, initial encounter for closed fracture: Secondary | ICD-10-CM | POA: Diagnosis not present

## 2016-02-26 DIAGNOSIS — S0990XA Unspecified injury of head, initial encounter: Secondary | ICD-10-CM | POA: Diagnosis not present

## 2016-02-26 DIAGNOSIS — S0292XA Unspecified fracture of facial bones, initial encounter for closed fracture: Secondary | ICD-10-CM

## 2016-02-26 DIAGNOSIS — S299XXA Unspecified injury of thorax, initial encounter: Secondary | ICD-10-CM | POA: Diagnosis not present

## 2016-02-26 DIAGNOSIS — R9431 Abnormal electrocardiogram [ECG] [EKG]: Secondary | ICD-10-CM | POA: Diagnosis not present

## 2016-02-26 DIAGNOSIS — Z7982 Long term (current) use of aspirin: Secondary | ICD-10-CM | POA: Insufficient documentation

## 2016-02-26 DIAGNOSIS — I252 Old myocardial infarction: Secondary | ICD-10-CM | POA: Diagnosis not present

## 2016-02-26 DIAGNOSIS — S0993XA Unspecified injury of face, initial encounter: Secondary | ICD-10-CM | POA: Diagnosis present

## 2016-02-26 DIAGNOSIS — R51 Headache: Secondary | ICD-10-CM | POA: Diagnosis not present

## 2016-02-26 DIAGNOSIS — Y9301 Activity, walking, marching and hiking: Secondary | ICD-10-CM | POA: Insufficient documentation

## 2016-02-26 DIAGNOSIS — I2510 Atherosclerotic heart disease of native coronary artery without angina pectoris: Secondary | ICD-10-CM | POA: Diagnosis not present

## 2016-02-26 DIAGNOSIS — I10 Essential (primary) hypertension: Secondary | ICD-10-CM | POA: Insufficient documentation

## 2016-02-26 DIAGNOSIS — S0093XA Contusion of unspecified part of head, initial encounter: Secondary | ICD-10-CM | POA: Diagnosis not present

## 2016-02-26 DIAGNOSIS — S61412A Laceration without foreign body of left hand, initial encounter: Secondary | ICD-10-CM | POA: Diagnosis not present

## 2016-02-26 DIAGNOSIS — S02400A Malar fracture unspecified, initial encounter for closed fracture: Secondary | ICD-10-CM | POA: Diagnosis not present

## 2016-02-26 LAB — CBC WITH DIFFERENTIAL/PLATELET
BASOS ABS: 0 10*3/uL (ref 0.0–0.1)
Basophils Relative: 0 %
EOS PCT: 2 %
Eosinophils Absolute: 0.2 10*3/uL (ref 0.0–0.7)
HCT: 42 % (ref 39.0–52.0)
HEMOGLOBIN: 14.6 g/dL (ref 13.0–17.0)
LYMPHS ABS: 1.6 10*3/uL (ref 0.7–4.0)
Lymphocytes Relative: 13 %
MCH: 31.9 pg (ref 26.0–34.0)
MCHC: 34.8 g/dL (ref 30.0–36.0)
MCV: 91.9 fL (ref 78.0–100.0)
Monocytes Absolute: 1.8 10*3/uL — ABNORMAL HIGH (ref 0.1–1.0)
Monocytes Relative: 16 %
NEUTROS PCT: 69 %
Neutro Abs: 8.2 10*3/uL — ABNORMAL HIGH (ref 1.7–7.7)
PLATELETS: 183 10*3/uL (ref 150–400)
RBC: 4.57 MIL/uL (ref 4.22–5.81)
RDW: 12.8 % (ref 11.5–15.5)
WBC: 11.8 10*3/uL — AB (ref 4.0–10.5)

## 2016-02-26 LAB — URINALYSIS, ROUTINE W REFLEX MICROSCOPIC
BILIRUBIN URINE: NEGATIVE
Bacteria, UA: NONE SEEN
Glucose, UA: NEGATIVE mg/dL
Hgb urine dipstick: NEGATIVE
Ketones, ur: NEGATIVE mg/dL
LEUKOCYTES UA: NEGATIVE
Nitrite: NEGATIVE
PH: 7 (ref 5.0–8.0)
Protein, ur: NEGATIVE mg/dL
SPECIFIC GRAVITY, URINE: 1.011 (ref 1.005–1.030)
Squamous Epithelial / LPF: NONE SEEN

## 2016-02-26 LAB — COMPREHENSIVE METABOLIC PANEL
ALT: 33 U/L (ref 17–63)
ANION GAP: 9 (ref 5–15)
AST: 22 U/L (ref 15–41)
Albumin: 3.7 g/dL (ref 3.5–5.0)
Alkaline Phosphatase: 36 U/L — ABNORMAL LOW (ref 38–126)
BILIRUBIN TOTAL: 0.4 mg/dL (ref 0.3–1.2)
BUN: 9 mg/dL (ref 6–20)
CHLORIDE: 100 mmol/L — AB (ref 101–111)
CO2: 29 mmol/L (ref 22–32)
Calcium: 9.4 mg/dL (ref 8.9–10.3)
Creatinine, Ser: 0.92 mg/dL (ref 0.61–1.24)
Glucose, Bld: 108 mg/dL — ABNORMAL HIGH (ref 65–99)
POTASSIUM: 4.1 mmol/L (ref 3.5–5.1)
Sodium: 138 mmol/L (ref 135–145)
TOTAL PROTEIN: 6 g/dL — AB (ref 6.5–8.1)

## 2016-02-26 LAB — RAPID URINE DRUG SCREEN, HOSP PERFORMED
AMPHETAMINES: NOT DETECTED
BARBITURATES: NOT DETECTED
BENZODIAZEPINES: NOT DETECTED
COCAINE: NOT DETECTED
Opiates: NOT DETECTED
TETRAHYDROCANNABINOL: NOT DETECTED

## 2016-02-26 LAB — I-STAT TROPONIN, ED: Troponin i, poc: 0 ng/mL (ref 0.00–0.08)

## 2016-02-26 MED ORDER — SODIUM CHLORIDE 0.9 % IV BOLUS (SEPSIS)
1000.0000 mL | Freq: Once | INTRAVENOUS | Status: AC
  Administered 2016-02-26: 1000 mL via INTRAVENOUS

## 2016-02-26 MED ORDER — CEPHALEXIN 500 MG PO CAPS: 500.0000 mg | ORAL_CAPSULE | Freq: Three times a day (TID) | ORAL | 0 refills | Status: AC

## 2016-02-26 MED ORDER — TETANUS-DIPHTH-ACELL PERTUSSIS 5-2.5-18.5 LF-MCG/0.5 IM SUSP: 0.5000 mL | Freq: Once | INTRAMUSCULAR | Status: DC

## 2016-02-26 MED ORDER — BACITRACIN ZINC 500 UNIT/GM EX OINT: TOPICAL_OINTMENT | Freq: Once | CUTANEOUS | Status: DC

## 2016-02-26 MED ORDER — MORPHINE SULFATE (PF) 4 MG/ML IV SOLN
4.0000 mg | Freq: Once | INTRAVENOUS | Status: AC
  Administered 2016-02-26: 4 mg via INTRAVENOUS
  Filled 2016-02-26: qty 1

## 2016-02-26 NOTE — Discharge Instructions (Signed)
You have been seen today following a fall. You have multiple facial fractures noted on the CT scan. Please do the following:  1. Facial fractures: Keep your head elevated and apply ice to reduce inflammation. Do not blow your nose. May use ibuprofen, naproxen, or tylenol for pain. May use your home pain medication for severe pain.  2. Ear, Nose, and Throat specialist: Follow up with Dr. Erik Obey in one week. Call the office to set up this appointment. 3. Ophthalmology: Follow up with Dr. Herbert Deaner to have your eyes and eye function evaluated as soon as possible. Call the office to make this appointment. 4. Cardiology: Follow up with Dr. Irish Lack as soon as possible for cardiac evaluation as soon as possible. Please call the office to make this appointment.  5. Neurology: Follow up with your neurologist and make him aware of these falls.  6. Gabapentin: Reduce this medication frequency back down to 300 mg three times a day. Review this change with your neurologist. 7. Antibiotic: You have been prescribed an antibiotic. Please take this medication until gone.  Return to the ED for recurrent symptoms, falls, or any other major concerns.

## 2016-02-26 NOTE — ED Triage Notes (Signed)
Pt. Experienced a fall today while getting the mail. He hit his head on the mirror of his car with his head. Swollen L eye, no LOC. L arm in sling from a previous shoulder trauma.

## 2016-02-26 NOTE — Telephone Encounter (Signed)
Pa need for cyclobenzaprine  [id number] Cover my meds

## 2016-02-26 NOTE — ED Provider Notes (Signed)
[facility] DEPT Provider Note   CSN: [id number] Arrival date & time: 02/26/16  1014     History   Chief Complaint Chief Complaint  Patient presents with  . Fall    HPI Billy Knox is a 76 y.o. male.  HPI   Billy Knox is a 76 y.o. male, with a history of MI, HTN, and trigeminal neuralgia, presenting to the ED with An episode of near syncope followed by a fall that occurred just prior to arrival. Patient states he was walking up his driveway and he suddenly became lightheaded. Patient tried to steady himself on the side mirror of his vehicle, but this moved and he fell to the ground. Patient hit face first onto the concrete without catching himself. Patient complains of injury to the left side of his face, left hand, and abrasions to his abdomen. Patient endorsed some blurred vision with EMS, however, he states that this is normal for him at baseline. Currently complains of 4/10 throbbing pain in the face. He adds that his neurontin dosing was increased from 300 mg TID to 300 mg 6 times daily three weeks ago and since then has felt more unsteady on his feet.   Patient denies LOC, neck/back pain, neuro deficits, nausea/vomiting, shortness of breath, chest pain, or any other complaints. Denies anticoagulation.    Past Medical History:  Diagnosis Date  . Colon polyp   . Coronary artery disease   . Deafness in right ear   . Esophageal stricture   . GERD (gastroesophageal reflux disease)    past history no recent problems  . Hyperchloremia   . Hypertension   . Myocardial infarction    hx. silent MI - no intervention except oral meds 7 yrs ago  . Rt facial numbness   . 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 Trigeminal neuralgia     Patient Active Problem List   Diagnosis Date Noted  . Mixed hyperlipidemia 03/24/2013  . Coronary artery disease   . Hypertension   . Hyperchloremia   . Esophageal stricture   . Colon polyp   . 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 Trigeminal  neuralgia   . Deafness in right ear   . Rt facial numbness     Past Surgical History:  Procedure Laterality Date  . CYSTOSCOPY    . ESOPHAGOGASTRODUODENOSCOPY    . ESOPHAGOGASTRODUODENOSCOPY  06/14/2011   Procedure: ESOPHAGOGASTRODUODENOSCOPY (EGD);  Surgeon: Winfield Cunas., MD;  Location: [facility];  Service: Endoscopy;  Laterality: N/A;  . ESOPHAGOGASTRODUODENOSCOPY (EGD) WITH PROPOFOL N/A 08/12/2014   Procedure: ESOPHAGOGASTRODUODENOSCOPY (EGD) WITH PROPOFOL;  Surgeon: Laurence Spates, MD;  Location: [facility];  Service: Endoscopy;  Laterality: N/A;  . gamma knife     3 yrs ago [place] area around trigeminal nerve area"  . PROSTATE BIOPSY    . SAVORY DILATION  06/14/2011   Procedure: SAVORY DILATION;  Surgeon: Winfield Cunas., MD;  Location: [facility];  Service: Endoscopy;  Laterality: N/A;  need xray  . SAVORY DILATION N/A 08/12/2014   Procedure: SAVORY DILATION;  Surgeon: Laurence Spates, MD;  Location: [facility];  Service: Endoscopy;  Laterality: N/A;  . TONSILLECTOMY      child  . TRIGEMINAL NERVE BALLOON DECOMPRESSION Right    right ear deafness       Home Medications    Prior to Admission medications   Medication Sig Start Date End Date Taking? Authorizing Provider  acetaminophen (TYLENOL) 500 MG tablet Take 1,000 mg by mouth every  6 (six) hours as needed for mild pain.   Yes Historical Provider, MD  aspirin 81 MG tablet Take 81 mg by mouth daily.   Yes Historical Provider, MD  cyclobenzaprine (FLEXERIL) 5 MG tablet Take 1 tablet (5 mg total) by mouth 3 (three) times daily as needed for muscle spasms. 02/24/16  Yes Tereasa Coop, PA-C  finasteride (PROSCAR) 5 MG tablet Take 5 mg by mouth daily.   Yes Historical Provider, MD  gabapentin (NEURONTIN) 300 MG capsule Take 300 mg by mouth 6 (six) times daily.   Yes Historical Provider, MD  Glucosamine HCl 1500 MG TABS Take 1 tablet by mouth daily.    Yes Historical Provider, MD  lacosamide (VIMPAT) 50 MG  TABS tablet Take 50 mg by mouth 2 (two) times daily.   Yes Historical Provider, MD  lisinopril (PRINIVIL,ZESTRIL) 10 MG tablet Take 2 tablets (20 mg total) by mouth daily. 10/02/15  Yes Jettie Booze, MD  metoprolol succinate (TOPROL-XL) 50 MG 24 hr tablet Take 1 tablet (50 mg total) by mouth daily. 10/02/15  Yes Jettie Booze, MD  Multiple Vitamin (MULTIVITAMIN WITH MINERALS) TABS tablet Take 1 tablet by mouth daily.   Yes Historical Provider, MD  NALTREXONE HCL PO Take 2 mg by mouth at bedtime.   Yes Historical Provider, MD  Omega-3 Fatty Acids (FISH OIL) 1200 MG CAPS Take 2,400 mg by mouth 2 (two) times daily.   Yes Historical Provider, MD  OVER THE COUNTER MEDICATION Take 1 tablet by mouth daily. Med Name: PRILOSEC   Yes Historical Provider, MD  oxyCODONE (ROXICODONE) 5 MG immediate release tablet Take 5 mg by mouth every 4 (four) hours as needed for severe pain.   Yes Historical Provider, MD  pravastatin (PRAVACHOL) 80 MG tablet Take 1 tablet (80 mg total) by mouth daily. 10/02/15  Yes Jettie Booze, MD  tiaGABine (GABITRIL) 4 MG tablet Take 8 mg by mouth at bedtime.   Yes Historical Provider, MD  traMADol (ULTRAM) 50 MG tablet Take 0.5 tablets (25 mg total) by mouth every 8 (eight) hours as needed. 02/24/16  Yes Tereasa Coop, PA-C  cephALEXin (KEFLEX) 500 MG capsule Take 1 capsule (500 mg total) by mouth 3 (three) times daily. 02/26/16 03/02/16  Jaqulyn Chancellor C Porfiria Heinrich, PA-C  nitroGLYCERIN (NITROSTAT) 0.4 MG SL tablet Place 1 tablet (0.4 mg total) under the tongue every 5 (five) minutes as needed for chest pain. 10/02/15   Jettie Booze, MD    Family History Family History  Problem Relation Age of Onset  . Colon cancer Mother     Social History Social History  Substance Use Topics  . Smoking status: Former Smoker    Quit date: 06/14/1967  . Smokeless tobacco: Never Used  . Alcohol use 0.6 oz/week    1 Standard drinks or equivalent per week     Comment: rare      Allergies   Patient has no known allergies.   Review of Systems Review of Systems  HENT: Positive for facial swelling.   Respiratory: Negative for shortness of breath.   Cardiovascular: Negative for chest pain.  Gastrointestinal: Negative for abdominal pain, nausea and vomiting.  Musculoskeletal: Negative for back pain and neck pain.  Skin: Positive for wound.  Neurological: Positive for syncope (near-syncope) and light-headedness (resolved). Negative for weakness, numbness and headaches.  All other systems reviewed and are negative.    Physical Exam Updated Vital Signs BP 146/91 (BP Location: Right Arm)   Pulse 82  Temp 98.6 F (37 C) (Oral)   Resp 16   SpO2 99%   Physical Exam  Constitutional: He is oriented to person, place, and time. He appears well-developed and well-nourished. No distress.  HENT:  Head: Normocephalic.  Mouth/Throat: Oropharynx is clear and moist.  Swelling, ecchymosis, and tenderness in the left periorbital and left zygoma region. Tenderness and erythema to the bridge of the nose with dried blood in the naris. 2 cm laceration above the left eye in the left eyebrow.  Eyes: Conjunctivae and EOM are normal. Pupils are equal, round, and reactive to light.  Neck: Normal range of motion. Neck supple.  Cardiovascular: Normal rate, regular rhythm, normal heart sounds and intact distal pulses.   Pulmonary/Chest: Effort normal and breath sounds normal. No respiratory distress. He exhibits no tenderness.  Abdominal: Soft. There is no tenderness. There is no guarding.  Scattered, superficial abrasions across the abdomen.  Musculoskeletal: He exhibits no edema.  Normal motor function intact in all extremities and spine. No midline spinal tenderness.  Full range of motion without pain, crepitus, or deformity to the hip joints bilaterally. No pelvic instability. Patient is in a left arm sling from a fall a week ago where he injured his left arm and shoulder.  Range of motion intact in the left shoulder, elbow, and wrist. Range of motion and strength in the left hand equal to the right. The watch face of the patient's watch on left hand is noted to be broken.   Lymphadenopathy:    He has no cervical adenopathy.  Neurological: He is alert and oriented to person, place, and time.  No sensory deficits. Strength 5/5 in all extremities. No gait deficit. Coordination intact including heel to shin and finger to nose. Cranial nerves III-XII grossly intact. No facial droop.   Skin: Skin is warm and dry. He is not diaphoretic.  Superficial abrasion/skin tear dorsum of left hand.  Psychiatric: He has a normal mood and affect. His behavior is normal.  Nursing note and vitals reviewed.    ED Treatments / Results  Labs (all labs ordered are listed, but only abnormal results are displayed) Labs Reviewed  URINALYSIS, ROUTINE W REFLEX MICROSCOPIC - Abnormal; Notable for the following:       Result Value   APPearance HAZY (*)    All other components within normal limits  COMPREHENSIVE METABOLIC PANEL - Abnormal; Notable for the following:    Chloride 100 (*)    Glucose, Bld 108 (*)    Total Protein 6.0 (*)    Alkaline Phosphatase 36 (*)    All other components within normal limits  CBC WITH DIFFERENTIAL/PLATELET - Abnormal; Notable for the following:    WBC 11.8 (*)    Neutro Abs 8.2 (*)    Monocytes Absolute 1.8 (*)    All other components within normal limits  RAPID URINE DRUG SCREEN, HOSP PERFORMED  I-STAT TROPOININ, ED    EKG  EKG Interpretation  Date/Time:  Monday February 26 2016 10:27:07 EST Ventricular Rate:  77 PR Interval:    QRS Duration: 108 QT Interval:  407 QTC Calculation: 461 R Axis:   1 Text Interpretation:  Sinus rhythm Probable left atrial enlargement Inferior infarct, old No significant change was found Confirmed by CAMPOS  MD, Lennette Bihari ([id number]) on 02/26/2016 11:04:48 AM       Radiology Dg Chest 2 View  Result Date:  02/26/2016 CLINICAL DATA:  Near syncope with fall. EXAM: CHEST  2 VIEW COMPARISON:  Radiographs of December 19, 2014. FINDINGS: The heart size and mediastinal contours are within normal limits. Both lungs are clear. No pneumothorax or pleural effusion is noted. The visualized skeletal structures are unremarkable. IMPRESSION: No active cardiopulmonary disease. Electronically Signed   By: Marijo Conception, M.D.   On: 02/26/2016 11:44   Ct Head Wo Contrast  Result Date: 02/26/2016 CLINICAL DATA:  Pain following fall EXAM: CT HEAD WITHOUT CONTRAST CT MAXILLOFACIAL WITHOUT CONTRAST CT CERVICAL SPINE WITHOUT CONTRAST TECHNIQUE: Multidetector CT imaging of the head, cervical spine, and maxillofacial structures were performed using the standard protocol without intravenous contrast. Multiplanar CT image reconstructions of the cervical spine and maxillofacial structures were also generated. COMPARISON:  None. FINDINGS: CT HEAD FINDINGS Brain: There is mild diffuse atrophy. There is no intracranial mass, hemorrhage, extra-axial fluid collection, or midline shift. There is patchy small vessel disease throughout the centra semiovale bilaterally. No acute infarct is evident. Vascular: No hyperdense vessel. There is calcification in each carotid siphon region. Skull: There is a postoperative defect in the right occipital bone. No acute calvarial fracture is appreciable. Other: Mastoid air cells are clear. CT MAXILLOFACIAL FINDINGS Osseous: There is a fracture of the lateral left orbital wall with air tracking through this area into the lateral left orbit. There are fractures of the left anterior and lateral maxillary sinus walls with several displaced fragments in the lateral maxillary antral wall region. L There is a nondisplaced fracture of the left orbital floor with slight inferior displacement of the inferior rectus muscle on the left. There is a fracture at the zygomatic frontal suture on the left with slight displacement  of fracture fragments. The remainder of the zygomatic arch on the left is intact. There is a fracture of the superomedial aspect of the left maxillary antrum with fluid/hemorrhage protruding through this area into the left nasal cavity. No fracture to the right of midline evident. The pterygoid plates bilaterally are intact. There is no frank dislocation. Orbits: There is extensive preseptal soft tissue swelling on the left. There are foci of air in left orbit. There is minimal inferior displacement inferior rectus muscle on the left at the site of orbital floor fracture. There is no hemorrhage or mass within either orbit. Sinuses: There is an air-fluid level in the left maxillary antrum. Fluid is noted throughout the left maxillary antrum inferiorly extending medially into the left naris. There is opacification of several ethmoid air cells bilaterally, more severe on the left than on the right. The left ostiomeatal unit complex is obstructed. The right maxillary antrum is clear. The sphenoid and frontal sinuses are clear. The ostiomeatal unit complex on the right is patent. There is obstruction of the left naris, likely due to hemorrhage. Right naris is patent. Soft tissues: There is tissue swelling over the left mid upper face with areas of soft tissue air lateral to the left maxillary antrum and lateral left orbital wall. No abscess. Salivary glands appear normal. No adenopathy. Visualized pharynx appears normal. Tongue and tongue base regions appear normal. CT CERVICAL SPINE FINDINGS Alignment: There is no spondylolisthesis. Skull base and vertebrae: Skull base and craniocervical junction regions appear normal. No evident fracture. No blastic or lytic bone lesions. Soft tissues and spinal canal: Prevertebral soft tissues and predental space regions are normal. There is no paraspinous lesion. No spinal stenosis. No cord or canal hematoma evident. Disc levels: There is fairly severe disc space narrowing at C6-7.  There is moderate disc space narrowing at C4-5, C5-6, and C7-T1. There is  facet hypertrophy at multiple levels. There is moderate exit foraminal bilaterally at C6-7, more severe on the left than on the right, at C5-6, more severe on the right than the left. Milder exit foraminal narrowing is noted on the left at C3-4 and C4-5. No frank disc extrusion evident. Upper chest: Visualized lungs are clear. Other: None IMPRESSION: CT head: Atrophy with supratentorial small vessel disease. No intracranial mass hemorrhage, or extra-axial fluid collection. No acute infarct evident. Previous surgical removal a portion of the lateral right occipital bone. Areas of arterial vascular calcification noted. CT maxillofacial: There is a fracture of the left zygomatic frontal suture with mild displacement. There is a comminuted fracture of the left lateral orbital wall with air medial and lateral to this fracture with air lateral to left lobe posteriorly. There are fractures of the medial and lateral aspects of the anterior left maxillary antrum as well as comminuted fracture along the more posterior aspect lateral left maxillary antrum. There is a fracture of the medial left maxillary antrum along superior aspect. There is a nondisplaced orbital floor fracture. The left inferior rectus muscle is minimally displaced inferiorly at the site of this fracture. No dislocations. No fracture to the right of midline evident. Air-fluid level in left maxillary antrum with hemorrhage and fluid tracking from the left maxillary antrum into the left nasal cavity. There is obstruction of most of the left naris, likely due to hemorrhage. There is obstruction of the left ostiomeatal unit complex with hemorrhage and fluid in multiple left ethmoid air cells. Several right-sided ethmoid air cells show mucosal thickening. Soft tissue swelling over left mid upper face without well-defined mass. Preseptal edema over the left orbit. CT cervical spine: No  fracture or spondylolisthesis. Multilevel arthropathy. Electronically Signed   By: Lowella Grip III M.D.   On: 02/26/2016 11:23   Ct Cervical Spine Wo Contrast  Result Date: 02/26/2016 CLINICAL DATA:  Pain following fall EXAM: CT HEAD WITHOUT CONTRAST CT MAXILLOFACIAL WITHOUT CONTRAST CT CERVICAL SPINE WITHOUT CONTRAST TECHNIQUE: Multidetector CT imaging of the head, cervical spine, and maxillofacial structures were performed using the standard protocol without intravenous contrast. Multiplanar CT image reconstructions of the cervical spine and maxillofacial structures were also generated. COMPARISON:  None. FINDINGS: CT HEAD FINDINGS Brain: There is mild diffuse atrophy. There is no intracranial mass, hemorrhage, extra-axial fluid collection, or midline shift. There is patchy small vessel disease throughout the centra semiovale bilaterally. No acute infarct is evident. Vascular: No hyperdense vessel. There is calcification in each carotid siphon region. Skull: There is a postoperative defect in the right occipital bone. No acute calvarial fracture is appreciable. Other: Mastoid air cells are clear. CT MAXILLOFACIAL FINDINGS Osseous: There is a fracture of the lateral left orbital wall with air tracking through this area into the lateral left orbit. There are fractures of the left anterior and lateral maxillary sinus walls with several displaced fragments in the lateral maxillary antral wall region. L There is a nondisplaced fracture of the left orbital floor with slight inferior displacement of the inferior rectus muscle on the left. There is a fracture at the zygomatic frontal suture on the left with slight displacement of fracture fragments. The remainder of the zygomatic arch on the left is intact. There is a fracture of the superomedial aspect of the left maxillary antrum with fluid/hemorrhage protruding through this area into the left nasal cavity. No fracture to the right of midline evident. The  pterygoid plates bilaterally are intact. There is no frank dislocation.  Orbits: There is extensive preseptal soft tissue swelling on the left. There are foci of air in left orbit. There is minimal inferior displacement inferior rectus muscle on the left at the site of orbital floor fracture. There is no hemorrhage or mass within either orbit. Sinuses: There is an air-fluid level in the left maxillary antrum. Fluid is noted throughout the left maxillary antrum inferiorly extending medially into the left naris. There is opacification of several ethmoid air cells bilaterally, more severe on the left than on the right. The left ostiomeatal unit complex is obstructed. The right maxillary antrum is clear. The sphenoid and frontal sinuses are clear. The ostiomeatal unit complex on the right is patent. There is obstruction of the left naris, likely due to hemorrhage. Right naris is patent. Soft tissues: There is tissue swelling over the left mid upper face with areas of soft tissue air lateral to the left maxillary antrum and lateral left orbital wall. No abscess. Salivary glands appear normal. No adenopathy. Visualized pharynx appears normal. Tongue and tongue base regions appear normal. CT CERVICAL SPINE FINDINGS Alignment: There is no spondylolisthesis. Skull base and vertebrae: Skull base and craniocervical junction regions appear normal. No evident fracture. No blastic or lytic bone lesions. Soft tissues and spinal canal: Prevertebral soft tissues and predental space regions are normal. There is no paraspinous lesion. No spinal stenosis. No cord or canal hematoma evident. Disc levels: There is fairly severe disc space narrowing at C6-7. There is moderate disc space narrowing at C4-5, C5-6, and C7-T1. There is facet hypertrophy at multiple levels. There is moderate exit foraminal bilaterally at C6-7, more severe on the left than on the right, at C5-6, more severe on the right than the left. Milder exit foraminal  narrowing is noted on the left at C3-4 and C4-5. No frank disc extrusion evident. Upper chest: Visualized lungs are clear. Other: None IMPRESSION: CT head: Atrophy with supratentorial small vessel disease. No intracranial mass hemorrhage, or extra-axial fluid collection. No acute infarct evident. Previous surgical removal a portion of the lateral right occipital bone. Areas of arterial vascular calcification noted. CT maxillofacial: There is a fracture of the left zygomatic frontal suture with mild displacement. There is a comminuted fracture of the left lateral orbital wall with air medial and lateral to this fracture with air lateral to left lobe posteriorly. There are fractures of the medial and lateral aspects of the anterior left maxillary antrum as well as comminuted fracture along the more posterior aspect lateral left maxillary antrum. There is a fracture of the medial left maxillary antrum along superior aspect. There is a nondisplaced orbital floor fracture. The left inferior rectus muscle is minimally displaced inferiorly at the site of this fracture. No dislocations. No fracture to the right of midline evident. Air-fluid level in left maxillary antrum with hemorrhage and fluid tracking from the left maxillary antrum into the left nasal cavity. There is obstruction of most of the left naris, likely due to hemorrhage. There is obstruction of the left ostiomeatal unit complex with hemorrhage and fluid in multiple left ethmoid air cells. Several right-sided ethmoid air cells show mucosal thickening. Soft tissue swelling over left mid upper face without well-defined mass. Preseptal edema over the left orbit. CT cervical spine: No fracture or spondylolisthesis. Multilevel arthropathy. Electronically Signed   By: Lowella Grip III M.D.   On: 02/26/2016 11:23   Ct Maxillofacial Wo Contrast  Result Date: 02/26/2016 CLINICAL DATA:  Pain following fall EXAM: CT HEAD WITHOUT CONTRAST CT  MAXILLOFACIAL WITHOUT  CONTRAST CT CERVICAL SPINE WITHOUT CONTRAST TECHNIQUE: Multidetector CT imaging of the head, cervical spine, and maxillofacial structures were performed using the standard protocol without intravenous contrast. Multiplanar CT image reconstructions of the cervical spine and maxillofacial structures were also generated. COMPARISON:  None. FINDINGS: CT HEAD FINDINGS Brain: There is mild diffuse atrophy. There is no intracranial mass, hemorrhage, extra-axial fluid collection, or midline shift. There is patchy small vessel disease throughout the centra semiovale bilaterally. No acute infarct is evident. Vascular: No hyperdense vessel. There is calcification in each carotid siphon region. Skull: There is a postoperative defect in the right occipital bone. No acute calvarial fracture is appreciable. Other: Mastoid air cells are clear. CT MAXILLOFACIAL FINDINGS Osseous: There is a fracture of the lateral left orbital wall with air tracking through this area into the lateral left orbit. There are fractures of the left anterior and lateral maxillary sinus walls with several displaced fragments in the lateral maxillary antral wall region. L There is a nondisplaced fracture of the left orbital floor with slight inferior displacement of the inferior rectus muscle on the left. There is a fracture at the zygomatic frontal suture on the left with slight displacement of fracture fragments. The remainder of the zygomatic arch on the left is intact. There is a fracture of the superomedial aspect of the left maxillary antrum with fluid/hemorrhage protruding through this area into the left nasal cavity. No fracture to the right of midline evident. The pterygoid plates bilaterally are intact. There is no frank dislocation. Orbits: There is extensive preseptal soft tissue swelling on the left. There are foci of air in left orbit. There is minimal inferior displacement inferior rectus muscle on the left at the site of orbital floor  fracture. There is no hemorrhage or mass within either orbit. Sinuses: There is an air-fluid level in the left maxillary antrum. Fluid is noted throughout the left maxillary antrum inferiorly extending medially into the left naris. There is opacification of several ethmoid air cells bilaterally, more severe on the left than on the right. The left ostiomeatal unit complex is obstructed. The right maxillary antrum is clear. The sphenoid and frontal sinuses are clear. The ostiomeatal unit complex on the right is patent. There is obstruction of the left naris, likely due to hemorrhage. Right naris is patent. Soft tissues: There is tissue swelling over the left mid upper face with areas of soft tissue air lateral to the left maxillary antrum and lateral left orbital wall. No abscess. Salivary glands appear normal. No adenopathy. Visualized pharynx appears normal. Tongue and tongue base regions appear normal. CT CERVICAL SPINE FINDINGS Alignment: There is no spondylolisthesis. Skull base and vertebrae: Skull base and craniocervical junction regions appear normal. No evident fracture. No blastic or lytic bone lesions. Soft tissues and spinal canal: Prevertebral soft tissues and predental space regions are normal. There is no paraspinous lesion. No spinal stenosis. No cord or canal hematoma evident. Disc levels: There is fairly severe disc space narrowing at C6-7. There is moderate disc space narrowing at C4-5, C5-6, and C7-T1. There is facet hypertrophy at multiple levels. There is moderate exit foraminal bilaterally at C6-7, more severe on the left than on the right, at C5-6, more severe on the right than the left. Milder exit foraminal narrowing is noted on the left at C3-4 and C4-5. No frank disc extrusion evident. Upper chest: Visualized lungs are clear. Other: None IMPRESSION: CT head: Atrophy with supratentorial small vessel disease. No intracranial mass hemorrhage, or  extra-axial fluid collection. No acute infarct  evident. Previous surgical removal a portion of the lateral right occipital bone. Areas of arterial vascular calcification noted. CT maxillofacial: There is a fracture of the left zygomatic frontal suture with mild displacement. There is a comminuted fracture of the left lateral orbital wall with air medial and lateral to this fracture with air lateral to left lobe posteriorly. There are fractures of the medial and lateral aspects of the anterior left maxillary antrum as well as comminuted fracture along the more posterior aspect lateral left maxillary antrum. There is a fracture of the medial left maxillary antrum along superior aspect. There is a nondisplaced orbital floor fracture. The left inferior rectus muscle is minimally displaced inferiorly at the site of this fracture. No dislocations. No fracture to the right of midline evident. Air-fluid level in left maxillary antrum with hemorrhage and fluid tracking from the left maxillary antrum into the left nasal cavity. There is obstruction of most of the left naris, likely due to hemorrhage. There is obstruction of the left ostiomeatal unit complex with hemorrhage and fluid in multiple left ethmoid air cells. Several right-sided ethmoid air cells show mucosal thickening. Soft tissue swelling over left mid upper face without well-defined mass. Preseptal edema over the left orbit. CT cervical spine: No fracture or spondylolisthesis. Multilevel arthropathy. Electronically Signed   By: Lowella Grip III M.D.   On: 02/26/2016 11:23    Procedures .Marland KitchenLaceration Repair Date/Time: 02/26/2016 2:49 PM Performed by: Lorayne Bender Authorized by: Lorayne Bender   Consent:    Consent obtained:  Verbal   Consent given by:  Patient   Risks discussed:  Infection, poor cosmetic result, need for additional repair, poor wound healing and retained foreign body Anesthesia (see MAR for exact dosages):    Anesthesia method:  None Laceration details:    Location:  Face    Face location:  L eyebrow   Length (cm):  2 Repair type:    Repair type:  Simple Pre-procedure details:    Preparation:  Imaging obtained to evaluate for foreign bodies Exploration:    Hemostasis achieved with:  Direct pressure   Wound exploration: wound explored through full range of motion   Treatment:    Area cleansed with:  Saline   Amount of cleaning:  Extensive   Irrigation solution:  Sterile saline   Irrigation method:  Syringe Skin repair:    Repair method:  Tissue adhesive Approximation:    Approximation:  Close Post-procedure details:    Dressing:  Open (no dressing)   Patient tolerance of procedure:  Tolerated well, no immediate complications     (including critical care time)  Medications Ordered in ED Medications  Tdap (BOOSTRIX) injection 0.5 mL (0.5 mLs Intramuscular Not Given 02/26/16 1542)  bacitracin ointment (not administered)  sodium chloride 0.9 % bolus 1,000 mL (0 mLs Intravenous Stopped 02/26/16 1518)  morphine 4 MG/ML injection 4 mg (4 mg Intravenous Given 02/26/16 1227)     Initial Impression / Assessment and Plan / ED Course  I have reviewed the triage vital signs and the nursing notes.  Pertinent labs & imaging results that were available during my care of the patient were reviewed by me and considered in my medical decision making (see chart for details).     Patient presents with a facial injury following a near-syncopal episode. This situation could very well be due to his neurontin dosing. Lab results are reassuring. Some orthostatic changes noted. Patient rehydrated with IV fluids.  Patient has full ROM in his left shoulder on exam today. He is supposed to be using a walker, but has not been doing so due to his arm being in the sling. His sling was removed and he was encouraged to use his walker. We will also reduce his neurontin dosing back down to three times daily and have him follow up with his Neurologist at [facility] as soon as  possible.  Dr. Erik Obey, ENT, evaluated the patient and recommends the following: Low dose Keflex or Amoxicillin, Ice, elevation, Ophthalmology consult outpatient, no nose blowing, and follow up with Dr. Erik Obey in the office in a week. This information was passed on to the patient.  Home care and return precautions were discussed. Narcotic pain management was discussed. Pt requested no narcotic prescriptions stating that he already has some at home from his previous surgery. Pt was advised to use caution with these medications due to fall risks. Patient and patient's wife at the bedside voice understanding of all instructions and are comfortable with discharge.  Findings and plan of care discussed with Jola Schmidt, MD. Dr. Venora Maples personally evaluated and examined this patient.   Pt will follow up with the following people: [facility], ENT [place], ophthalmology [place], cardiology [facility] neurologist  Vitals:   02/26/16 1200 02/26/16 1215 02/26/16 1301 02/26/16 1330  BP: 163/89 157/86 161/89 138/86  Pulse: 72 74 75 72  Resp: 14 13 14 14   Temp:      TempSrc:      SpO2: 99% 98% 98% 100%   Vitals:   02/26/16 1430 02/26/16 1445 02/26/16 1500 02/26/16 1515  BP: 154/83 144/63 143/75 151/77  Pulse: 75 76 77 79  Resp: 18 17 14 19   Temp:      TempSrc:      SpO2: 100% 98% 98% 99%     Orthostatic VS for the past 24 hrs:  BP- Lying Pulse- Lying BP- Sitting Pulse- Sitting BP- Standing at 0 minutes Pulse- Standing at 0 minutes  02/26/16 1302 161/89 75 (!) 159/95 81 116/74 86     Final Clinical Impressions(s) / ED Diagnoses   Final diagnoses:  Fall, initial encounter  Closed fracture of facial bone due to fall, initial encounter [facility])    New Prescriptions Discharge Medication List as of 02/26/2016  3:25 PM    START taking these medications   Details  cephALEXin (KEFLEX) 500 MG capsule Take 1 capsule (500 mg total) by mouth 3 (three) times daily., Starting Mon 02/26/2016, Until Sat  03/02/2016, Print         Lorayne Bender, PA-C 02/26/16 [place], MD 02/28/16 [phone]

## 2016-02-26 NOTE — Consult Note (Signed)
Billy Knox, Billy Knox 76 y.o., male [id number]     Chief Complaint: facial trauma  HPI: 76 yo wm probably fainted and fell, striking his LEFT face on the ground.  Not witnessed.  Had a similar episode 2 days ago, injuring his LEFT shoulder.  Curiously, after this second fall, his LEFT shoulder no longer hurts!  CT maxillofacial shows comminuted but non displaced fx LEFT zygoma including orbital floor but not arch.  No change in vision. No trismus or malocclusion.    No hearing AD s/p surgery for trigemingal neuralgia 25 yrs ago.    PMH: Past Medical History:  Diagnosis Date  . Colon polyp   . Coronary artery disease   . Deafness in right ear   . Esophageal stricture   . GERD (gastroesophageal reflux disease)    past history no recent problems  . Hyperchloremia   . Hypertension   . Myocardial infarction    hx. silent MI - no intervention except oral meds 7 yrs ago  . Rt facial numbness   . 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 Trigeminal neuralgia     Surg Hx: Past Surgical History:  Procedure Laterality Date  . CYSTOSCOPY    . ESOPHAGOGASTRODUODENOSCOPY    . ESOPHAGOGASTRODUODENOSCOPY  06/14/2011   Procedure: ESOPHAGOGASTRODUODENOSCOPY (EGD);  Surgeon: Winfield Cunas., MD;  Location: [facility];  Service: Endoscopy;  Laterality: N/A;  . ESOPHAGOGASTRODUODENOSCOPY (EGD) WITH PROPOFOL N/A 08/12/2014   Procedure: ESOPHAGOGASTRODUODENOSCOPY (EGD) WITH PROPOFOL;  Surgeon: Laurence Spates, MD;  Location: [facility];  Service: Endoscopy;  Laterality: N/A;  . gamma knife     3 yrs ago [place] area around trigeminal nerve area"  . PROSTATE BIOPSY    . SAVORY DILATION  06/14/2011   Procedure: SAVORY DILATION;  Surgeon: Winfield Cunas., MD;  Location: [facility];  Service: Endoscopy;  Laterality: N/A;  need xray  . SAVORY DILATION N/A 08/12/2014   Procedure: SAVORY DILATION;  Surgeon: Laurence Spates, MD;  Location: [facility];  Service: Endoscopy;  Laterality: N/A;  .  TONSILLECTOMY      child  . TRIGEMINAL NERVE BALLOON DECOMPRESSION Right    right ear deafness    FHx:   Family History  Problem Relation Age of Onset  . Colon cancer Mother    SocHx:  reports that he quit smoking about 48 years ago. He has never used smokeless tobacco. He reports that he drinks about 0.6 oz of alcohol per week . He reports that he does not use drugs.  ALLERGIES: No Known Allergies   (Not in a hospital admission)  Results for orders placed or performed during the hospital encounter of 02/26/16 (from the past 48 hour(s))  Urinalysis, Routine w reflex microscopic     Status: Abnormal   Collection Time: 02/26/16 11:07 AM  Result Value Ref Range   Color, Urine YELLOW YELLOW   APPearance HAZY (A) CLEAR   Specific Gravity, Urine 1.011 1.005 - 1.030   pH 7.0 5.0 - 8.0   Glucose, UA NEGATIVE NEGATIVE mg/dL   Hgb urine dipstick NEGATIVE NEGATIVE   Bilirubin Urine NEGATIVE NEGATIVE   Ketones, ur NEGATIVE NEGATIVE mg/dL   Protein, ur NEGATIVE NEGATIVE mg/dL   Nitrite NEGATIVE NEGATIVE   Leukocytes, UA NEGATIVE NEGATIVE   RBC / HPF 0-5 0 - 5 RBC/hpf   WBC, UA 0-5 0 - 5 WBC/hpf   Bacteria, UA NONE SEEN NONE SEEN   Squamous Epithelial / LPF NONE SEEN NONE SEEN   Mucous PRESENT  Urine rapid drug screen (hosp performed)     Status: None   Collection Time: 02/26/16 11:07 AM  Result Value Ref Range   Opiates NONE DETECTED NONE DETECTED   Cocaine NONE DETECTED NONE DETECTED   Benzodiazepines NONE DETECTED NONE DETECTED   Amphetamines NONE DETECTED NONE DETECTED   Tetrahydrocannabinol NONE DETECTED NONE DETECTED   Barbiturates NONE DETECTED NONE DETECTED    Comment:        DRUG SCREEN FOR MEDICAL PURPOSES ONLY.  IF CONFIRMATION IS NEEDED FOR ANY PURPOSE, NOTIFY LAB WITHIN 5 DAYS.        LOWEST DETECTABLE LIMITS FOR URINE DRUG SCREEN Drug Class       Cutoff (ng/mL) Amphetamine      1000 Barbiturate      200 Benzodiazepine   606 Tricyclics       004 Opiates           300 Cocaine          300 THC              50   Comprehensive metabolic panel     Status: Abnormal   Collection Time: 02/26/16 11:12 AM  Result Value Ref Range   Sodium 138 135 - 145 mmol/L   Potassium 4.1 3.5 - 5.1 mmol/L   Chloride 100 (L) 101 - 111 mmol/L   CO2 29 22 - 32 mmol/L   Glucose, Bld 108 (H) 65 - 99 mg/dL   BUN 9 6 - 20 mg/dL   Creatinine, Ser 0.92 0.61 - 1.24 mg/dL   Calcium 9.4 8.9 - 10.3 mg/dL   Total Protein 6.0 (L) 6.5 - 8.1 g/dL   Albumin 3.7 3.5 - 5.0 g/dL   AST 22 15 - 41 U/L   ALT 33 17 - 63 U/L   Alkaline Phosphatase 36 (L) 38 - 126 U/L   Total Bilirubin 0.4 0.3 - 1.2 mg/dL   GFR calc non Af Amer >60 >60 mL/min   GFR calc Af Amer >60 >60 mL/min    Comment: (NOTE) The eGFR has been calculated using the CKD EPI equation. This calculation has not been validated in all clinical situations. eGFR's persistently <60 mL/min signify possible Chronic Kidney Disease.    Anion gap 9 5 - 15  CBC with Differential     Status: Abnormal   Collection Time: 02/26/16 11:12 AM  Result Value Ref Range   WBC 11.8 (H) 4.0 - 10.5 K/uL   RBC 4.57 4.22 - 5.81 MIL/uL   Hemoglobin 14.6 13.0 - 17.0 g/dL   HCT 42.0 39.0 - 52.0 %   MCV 91.9 78.0 - 100.0 fL   MCH 31.9 26.0 - 34.0 pg   MCHC 34.8 30.0 - 36.0 g/dL   RDW 12.8 11.5 - 15.5 %   Platelets 183 150 - 400 K/uL   Neutrophils Relative % 69 %   Neutro Abs 8.2 (H) 1.7 - 7.7 K/uL   Lymphocytes Relative 13 %   Lymphs Abs 1.6 0.7 - 4.0 K/uL   Monocytes Relative 16 %   Monocytes Absolute 1.8 (H) 0.1 - 1.0 K/uL   Eosinophils Relative 2 %   Eosinophils Absolute 0.2 0.0 - 0.7 K/uL   Basophils Relative 0 %   Basophils Absolute 0.0 0.0 - 0.1 K/uL  I-stat troponin, ED     Status: None   Collection Time: 02/26/16 11:28 AM  Result Value Ref Range   Troponin i, poc 0.00 0.00 - 0.08 ng/mL   Comment 3  Comment: Due to the release kinetics of cTnI, a negative result within the first hours of the onset of symptoms  does not rule out myocardial infarction with certainty. If myocardial infarction is still suspected, repeat the test at appropriate intervals.    Dg Chest 2 View  Result Date: 02/26/2016 CLINICAL DATA:  Near syncope with fall. EXAM: CHEST  2 VIEW COMPARISON:  Radiographs of December 19, 2014. FINDINGS: The heart size and mediastinal contours are within normal limits. Both lungs are clear. No pneumothorax or pleural effusion is noted. The visualized skeletal structures are unremarkable. IMPRESSION: No active cardiopulmonary disease. Electronically Signed   By: Marijo Conception, M.D.   On: 02/26/2016 11:44   Dg Cervical Spine 2 Or 3 Views  Result Date: 02/24/2016 CLINICAL DATA:  Left shoulder pain status post fall. Status post neck surgery November 2017. EXAM: CERVICAL SPINE - 2-3 VIEW COMPARISON:  None. FINDINGS: There is no evidence of cervical spine fracture or prevertebral soft tissue swelling. Alignment is normal. No other significant bone abnormalities are identified. Mild degenerative disc disease with disc height loss at C4-5 and C5-6. Degenerative disc disease with significant disc height loss at C6-7. IMPRESSION: No acute osseous injury of the cervical spine. Electronically Signed   By: Kathreen Devoid   On: 02/24/2016 15:46   Ct Head Wo Contrast  Result Date: 02/26/2016 CLINICAL DATA:  Pain following fall EXAM: CT HEAD WITHOUT CONTRAST CT MAXILLOFACIAL WITHOUT CONTRAST CT CERVICAL SPINE WITHOUT CONTRAST TECHNIQUE: Multidetector CT imaging of the head, cervical spine, and maxillofacial structures were performed using the standard protocol without intravenous contrast. Multiplanar CT image reconstructions of the cervical spine and maxillofacial structures were also generated. COMPARISON:  None. FINDINGS: CT HEAD FINDINGS Brain: There is mild diffuse atrophy. There is no intracranial mass, hemorrhage, extra-axial fluid collection, or midline shift. There is patchy small vessel disease throughout the  centra semiovale bilaterally. No acute infarct is evident. Vascular: No hyperdense vessel. There is calcification in each carotid siphon region. Skull: There is a postoperative defect in the right occipital bone. No acute calvarial fracture is appreciable. Other: Mastoid air cells are clear. CT MAXILLOFACIAL FINDINGS Osseous: There is a fracture of the lateral left orbital wall with air tracking through this area into the lateral left orbit. There are fractures of the left anterior and lateral maxillary sinus walls with several displaced fragments in the lateral maxillary antral wall region. L There is a nondisplaced fracture of the left orbital floor with slight inferior displacement of the inferior rectus muscle on the left. There is a fracture at the zygomatic frontal suture on the left with slight displacement of fracture fragments. The remainder of the zygomatic arch on the left is intact. There is a fracture of the superomedial aspect of the left maxillary antrum with fluid/hemorrhage protruding through this area into the left nasal cavity. No fracture to the right of midline evident. The pterygoid plates bilaterally are intact. There is no frank dislocation. Orbits: There is extensive preseptal soft tissue swelling on the left. There are foci of air in left orbit. There is minimal inferior displacement inferior rectus muscle on the left at the site of orbital floor fracture. There is no hemorrhage or mass within either orbit. Sinuses: There is an air-fluid level in the left maxillary antrum. Fluid is noted throughout the left maxillary antrum inferiorly extending medially into the left naris. There is opacification of several ethmoid air cells bilaterally, more severe on the left than on the right. The  left ostiomeatal unit complex is obstructed. The right maxillary antrum is clear. The sphenoid and frontal sinuses are clear. The ostiomeatal unit complex on the right is patent. There is obstruction of the  left naris, likely due to hemorrhage. Right naris is patent. Soft tissues: There is tissue swelling over the left mid upper face with areas of soft tissue air lateral to the left maxillary antrum and lateral left orbital wall. No abscess. Salivary glands appear normal. No adenopathy. Visualized pharynx appears normal. Tongue and tongue base regions appear normal. CT CERVICAL SPINE FINDINGS Alignment: There is no spondylolisthesis. Skull base and vertebrae: Skull base and craniocervical junction regions appear normal. No evident fracture. No blastic or lytic bone lesions. Soft tissues and spinal canal: Prevertebral soft tissues and predental space regions are normal. There is no paraspinous lesion. No spinal stenosis. No cord or canal hematoma evident. Disc levels: There is fairly severe disc space narrowing at C6-7. There is moderate disc space narrowing at C4-5, C5-6, and C7-T1. There is facet hypertrophy at multiple levels. There is moderate exit foraminal bilaterally at C6-7, more severe on the left than on the right, at C5-6, more severe on the right than the left. Milder exit foraminal narrowing is noted on the left at C3-4 and C4-5. No frank disc extrusion evident. Upper chest: Visualized lungs are clear. Other: None IMPRESSION: CT head: Atrophy with supratentorial small vessel disease. No intracranial mass hemorrhage, or extra-axial fluid collection. No acute infarct evident. Previous surgical removal a portion of the lateral right occipital bone. Areas of arterial vascular calcification noted. CT maxillofacial: There is a fracture of the left zygomatic frontal suture with mild displacement. There is a comminuted fracture of the left lateral orbital wall with air medial and lateral to this fracture with air lateral to left lobe posteriorly. There are fractures of the medial and lateral aspects of the anterior left maxillary antrum as well as comminuted fracture along the more posterior aspect lateral left  maxillary antrum. There is a fracture of the medial left maxillary antrum along superior aspect. There is a nondisplaced orbital floor fracture. The left inferior rectus muscle is minimally displaced inferiorly at the site of this fracture. No dislocations. No fracture to the right of midline evident. Air-fluid level in left maxillary antrum with hemorrhage and fluid tracking from the left maxillary antrum into the left nasal cavity. There is obstruction of most of the left naris, likely due to hemorrhage. There is obstruction of the left ostiomeatal unit complex with hemorrhage and fluid in multiple left ethmoid air cells. Several right-sided ethmoid air cells show mucosal thickening. Soft tissue swelling over left mid upper face without well-defined mass. Preseptal edema over the left orbit. CT cervical spine: No fracture or spondylolisthesis. Multilevel arthropathy. Electronically Signed   By: Lowella Grip III M.D.   On: 02/26/2016 11:23   Ct Cervical Spine Wo Contrast  Result Date: 02/26/2016 CLINICAL DATA:  Pain following fall EXAM: CT HEAD WITHOUT CONTRAST CT MAXILLOFACIAL WITHOUT CONTRAST CT CERVICAL SPINE WITHOUT CONTRAST TECHNIQUE: Multidetector CT imaging of the head, cervical spine, and maxillofacial structures were performed using the standard protocol without intravenous contrast. Multiplanar CT image reconstructions of the cervical spine and maxillofacial structures were also generated. COMPARISON:  None. FINDINGS: CT HEAD FINDINGS Brain: There is mild diffuse atrophy. There is no intracranial mass, hemorrhage, extra-axial fluid collection, or midline shift. There is patchy small vessel disease throughout the centra semiovale bilaterally. No acute infarct is evident. Vascular: No hyperdense vessel. There  is calcification in each carotid siphon region. Skull: There is a postoperative defect in the right occipital bone. No acute calvarial fracture is appreciable. Other: Mastoid air cells are  clear. CT MAXILLOFACIAL FINDINGS Osseous: There is a fracture of the lateral left orbital wall with air tracking through this area into the lateral left orbit. There are fractures of the left anterior and lateral maxillary sinus walls with several displaced fragments in the lateral maxillary antral wall region. L There is a nondisplaced fracture of the left orbital floor with slight inferior displacement of the inferior rectus muscle on the left. There is a fracture at the zygomatic frontal suture on the left with slight displacement of fracture fragments. The remainder of the zygomatic arch on the left is intact. There is a fracture of the superomedial aspect of the left maxillary antrum with fluid/hemorrhage protruding through this area into the left nasal cavity. No fracture to the right of midline evident. The pterygoid plates bilaterally are intact. There is no frank dislocation. Orbits: There is extensive preseptal soft tissue swelling on the left. There are foci of air in left orbit. There is minimal inferior displacement inferior rectus muscle on the left at the site of orbital floor fracture. There is no hemorrhage or mass within either orbit. Sinuses: There is an air-fluid level in the left maxillary antrum. Fluid is noted throughout the left maxillary antrum inferiorly extending medially into the left naris. There is opacification of several ethmoid air cells bilaterally, more severe on the left than on the right. The left ostiomeatal unit complex is obstructed. The right maxillary antrum is clear. The sphenoid and frontal sinuses are clear. The ostiomeatal unit complex on the right is patent. There is obstruction of the left naris, likely due to hemorrhage. Right naris is patent. Soft tissues: There is tissue swelling over the left mid upper face with areas of soft tissue air lateral to the left maxillary antrum and lateral left orbital wall. No abscess. Salivary glands appear normal. No adenopathy.  Visualized pharynx appears normal. Tongue and tongue base regions appear normal. CT CERVICAL SPINE FINDINGS Alignment: There is no spondylolisthesis. Skull base and vertebrae: Skull base and craniocervical junction regions appear normal. No evident fracture. No blastic or lytic bone lesions. Soft tissues and spinal canal: Prevertebral soft tissues and predental space regions are normal. There is no paraspinous lesion. No spinal stenosis. No cord or canal hematoma evident. Disc levels: There is fairly severe disc space narrowing at C6-7. There is moderate disc space narrowing at C4-5, C5-6, and C7-T1. There is facet hypertrophy at multiple levels. There is moderate exit foraminal bilaterally at C6-7, more severe on the left than on the right, at C5-6, more severe on the right than the left. Milder exit foraminal narrowing is noted on the left at C3-4 and C4-5. No frank disc extrusion evident. Upper chest: Visualized lungs are clear. Other: None IMPRESSION: CT head: Atrophy with supratentorial small vessel disease. No intracranial mass hemorrhage, or extra-axial fluid collection. No acute infarct evident. Previous surgical removal a portion of the lateral right occipital bone. Areas of arterial vascular calcification noted. CT maxillofacial: There is a fracture of the left zygomatic frontal suture with mild displacement. There is a comminuted fracture of the left lateral orbital wall with air medial and lateral to this fracture with air lateral to left lobe posteriorly. There are fractures of the medial and lateral aspects of the anterior left maxillary antrum as well as comminuted fracture along the more posterior  aspect lateral left maxillary antrum. There is a fracture of the medial left maxillary antrum along superior aspect. There is a nondisplaced orbital floor fracture. The left inferior rectus muscle is minimally displaced inferiorly at the site of this fracture. No dislocations. No fracture to the right of  midline evident. Air-fluid level in left maxillary antrum with hemorrhage and fluid tracking from the left maxillary antrum into the left nasal cavity. There is obstruction of most of the left naris, likely due to hemorrhage. There is obstruction of the left ostiomeatal unit complex with hemorrhage and fluid in multiple left ethmoid air cells. Several right-sided ethmoid air cells show mucosal thickening. Soft tissue swelling over left mid upper face without well-defined mass. Preseptal edema over the left orbit. CT cervical spine: No fracture or spondylolisthesis. Multilevel arthropathy. Electronically Signed   By: Lowella Grip III M.D.   On: 02/26/2016 11:23   Dg Shoulder Left  Result Date: 02/24/2016 CLINICAL DATA:  Fall on Thursday.  Pain.  Felt a pop this morning. EXAM: LEFT SHOULDER - 2+ VIEW COMPARISON:  None. FINDINGS: No acute bony abnormality. Specifically, no fracture, subluxation, or dislocation. Soft tissues are intact. Joint spaces maintained. IMPRESSION: No acute bony abnormality. Electronically Signed   By: Rolm Baptise M.D.   On: 02/24/2016 15:45   Ct Maxillofacial Wo Contrast  Result Date: 02/26/2016 CLINICAL DATA:  Pain following fall EXAM: CT HEAD WITHOUT CONTRAST CT MAXILLOFACIAL WITHOUT CONTRAST CT CERVICAL SPINE WITHOUT CONTRAST TECHNIQUE: Multidetector CT imaging of the head, cervical spine, and maxillofacial structures were performed using the standard protocol without intravenous contrast. Multiplanar CT image reconstructions of the cervical spine and maxillofacial structures were also generated. COMPARISON:  None. FINDINGS: CT HEAD FINDINGS Brain: There is mild diffuse atrophy. There is no intracranial mass, hemorrhage, extra-axial fluid collection, or midline shift. There is patchy small vessel disease throughout the centra semiovale bilaterally. No acute infarct is evident. Vascular: No hyperdense vessel. There is calcification in each carotid siphon region. Skull: There is  a postoperative defect in the right occipital bone. No acute calvarial fracture is appreciable. Other: Mastoid air cells are clear. CT MAXILLOFACIAL FINDINGS Osseous: There is a fracture of the lateral left orbital wall with air tracking through this area into the lateral left orbit. There are fractures of the left anterior and lateral maxillary sinus walls with several displaced fragments in the lateral maxillary antral wall region. L There is a nondisplaced fracture of the left orbital floor with slight inferior displacement of the inferior rectus muscle on the left. There is a fracture at the zygomatic frontal suture on the left with slight displacement of fracture fragments. The remainder of the zygomatic arch on the left is intact. There is a fracture of the superomedial aspect of the left maxillary antrum with fluid/hemorrhage protruding through this area into the left nasal cavity. No fracture to the right of midline evident. The pterygoid plates bilaterally are intact. There is no frank dislocation. Orbits: There is extensive preseptal soft tissue swelling on the left. There are foci of air in left orbit. There is minimal inferior displacement inferior rectus muscle on the left at the site of orbital floor fracture. There is no hemorrhage or mass within either orbit. Sinuses: There is an air-fluid level in the left maxillary antrum. Fluid is noted throughout the left maxillary antrum inferiorly extending medially into the left naris. There is opacification of several ethmoid air cells bilaterally, more severe on the left than on the right. The left ostiomeatal  unit complex is obstructed. The right maxillary antrum is clear. The sphenoid and frontal sinuses are clear. The ostiomeatal unit complex on the right is patent. There is obstruction of the left naris, likely due to hemorrhage. Right naris is patent. Soft tissues: There is tissue swelling over the left mid upper face with areas of soft tissue air  lateral to the left maxillary antrum and lateral left orbital wall. No abscess. Salivary glands appear normal. No adenopathy. Visualized pharynx appears normal. Tongue and tongue base regions appear normal. CT CERVICAL SPINE FINDINGS Alignment: There is no spondylolisthesis. Skull base and vertebrae: Skull base and craniocervical junction regions appear normal. No evident fracture. No blastic or lytic bone lesions. Soft tissues and spinal canal: Prevertebral soft tissues and predental space regions are normal. There is no paraspinous lesion. No spinal stenosis. No cord or canal hematoma evident. Disc levels: There is fairly severe disc space narrowing at C6-7. There is moderate disc space narrowing at C4-5, C5-6, and C7-T1. There is facet hypertrophy at multiple levels. There is moderate exit foraminal bilaterally at C6-7, more severe on the left than on the right, at C5-6, more severe on the right than the left. Milder exit foraminal narrowing is noted on the left at C3-4 and C4-5. No frank disc extrusion evident. Upper chest: Visualized lungs are clear. Other: None IMPRESSION: CT head: Atrophy with supratentorial small vessel disease. No intracranial mass hemorrhage, or extra-axial fluid collection. No acute infarct evident. Previous surgical removal a portion of the lateral right occipital bone. Areas of arterial vascular calcification noted. CT maxillofacial: There is a fracture of the left zygomatic frontal suture with mild displacement. There is a comminuted fracture of the left lateral orbital wall with air medial and lateral to this fracture with air lateral to left lobe posteriorly. There are fractures of the medial and lateral aspects of the anterior left maxillary antrum as well as comminuted fracture along the more posterior aspect lateral left maxillary antrum. There is a fracture of the medial left maxillary antrum along superior aspect. There is a nondisplaced orbital floor fracture. The left inferior  rectus muscle is minimally displaced inferiorly at the site of this fracture. No dislocations. No fracture to the right of midline evident. Air-fluid level in left maxillary antrum with hemorrhage and fluid tracking from the left maxillary antrum into the left nasal cavity. There is obstruction of most of the left naris, likely due to hemorrhage. There is obstruction of the left ostiomeatal unit complex with hemorrhage and fluid in multiple left ethmoid air cells. Several right-sided ethmoid air cells show mucosal thickening. Soft tissue swelling over left mid upper face without well-defined mass. Preseptal edema over the left orbit. CT cervical spine: No fracture or spondylolisthesis. Multilevel arthropathy. Electronically Signed   By: Lowella Grip III M.D.   On: 02/26/2016 11:23     Blood pressure 152/81, pulse 75, temperature 98.6 F (37 C), temperature source Oral, resp. rate 21, SpO2 99 %.  PHYSICAL EXAM: Overall appearance:   Excoriation and periorbital ecchymoses LEFT face, LEFT hand.  Mental status intact.   Eyes:  EOMI, intact vision subjectively OU. Head:  No change in bony contours  Or stepoffs.   Ears: clear Nose:  External nose straight and non-tender.  Internal with some old blood Oral Cavity:  Teeth in good repair.  No trismus.  Occlusion looks OK. Oral Pharynx/Hypopharynx/Larynx: not examined Neuro:  Cranial nerves intact Neck: clear  Studies Reviewed:  CT maxillofacial    Assessment/Plan Possible  recurrent syncopal spells.  Nondisplaced LEFT zygoma fx.  Plan:  Ice, elevation, no nose blowing x 2 weeks.  Low dose antibiotics.  Ophth consult.  Recheck my office 1 week.   Will not need surgical repair.   Re-assess shoulder.  Jodi Marble 2/87/6811, 1:00 PM

## 2016-02-26 NOTE — ED Notes (Signed)
Hooked patient up to the monitor patient is resting 

## 2016-02-27 ENCOUNTER — Telehealth: Payer: Self-pay | Admitting: Interventional Cardiology

## 2016-02-27 NOTE — Telephone Encounter (Signed)
Key is not valid, cant reach pharmacist, number keeps sending me back to a prompt

## 2016-02-27 NOTE — Telephone Encounter (Signed)
Patient called to report that he fell yesterday and broke 4 bones around his eye. He was told to follow up with ENT and cardiology. Appointment was made with Ermalinda Barrios, PA on 2/22 12:45pm.

## 2016-02-27 NOTE — Telephone Encounter (Signed)
Pharmacy advised pt already paid with discount card please disregard

## 2016-02-27 NOTE — Telephone Encounter (Signed)
New Message    Pt states that he had a fall and was told that he needed to let Dr. Irish Lack know. Wants to know if he needs to come in for an appointment. PCP was not contact nor visited.

## 2016-03-01 ENCOUNTER — Ambulatory Visit: Payer: Medicare Other

## 2016-03-04 DIAGNOSIS — S0280XD Fracture of other specified skull and facial bones, unspecified side, subsequent encounter for fracture with routine healing: Secondary | ICD-10-CM | POA: Diagnosis not present

## 2016-03-07 ENCOUNTER — Ambulatory Visit (INDEPENDENT_AMBULATORY_CARE_PROVIDER_SITE_OTHER): Payer: Medicare Other | Admitting: Physician Assistant

## 2016-03-07 ENCOUNTER — Encounter: Payer: Self-pay | Admitting: Physician Assistant

## 2016-03-07 VITALS — BP 112/82 | HR 76 | Ht 77.0 in | Wt 225.8 lb

## 2016-03-07 DIAGNOSIS — W19XXXD Unspecified fall, subsequent encounter: Secondary | ICD-10-CM

## 2016-03-07 DIAGNOSIS — G500 Trigeminal neuralgia: Secondary | ICD-10-CM

## 2016-03-07 DIAGNOSIS — I10 Essential (primary) hypertension: Secondary | ICD-10-CM | POA: Diagnosis not present

## 2016-03-07 DIAGNOSIS — I2510 Atherosclerotic heart disease of native coronary artery without angina pectoris: Secondary | ICD-10-CM | POA: Diagnosis not present

## 2016-03-07 DIAGNOSIS — W19XXXA Unspecified fall, initial encounter: Secondary | ICD-10-CM | POA: Insufficient documentation

## 2016-03-07 NOTE — Patient Instructions (Signed)
Medication Instructions:  The current medical regimen is effective;  continue present plan and medications.  Follow-Up: Follow up in 1 year with Dr. Irish Lack.  You will receive a letter in the mail 2 months before you are due.  Please call us when you receive this letter to schedule your follow up appointment.  If you need a refill on your cardiac medications before your next appointment, please call your pharmacy.  Thank you for choosing [facility]!!

## 2016-03-07 NOTE — Progress Notes (Signed)
Cardiology Office Note    Date:  03/07/2016   ID:  Billy Knox, DOB [birth year], MRN [id number]  PCP:  Gennette Pac, MD  Cardiologist: Dr. Irish Lack  Chief Complaint  Patient presents with  . Fall    seen for Dr. Irish Lack    History of Present Illness:  Billy Knox is a 76 y.o. male history of CAD CTO of RCA diagnosed many years ago, Left to right collaterals, medically managed. He has had gamma knife surgery for tic doloreux in 2013.  He still has sx, phantom pains. He walks for 3 miles/hour x almost 2 hours daily. Also has HTN, Obesity, HLD.  Patient went to the emergency room 02/26/16 with near syncope followed by a fall. He was walking up his driveway and suddenly became lightheaded. He tried to steady himself that he fell to the ground. He hit his face on the concrete without catching himself. His Neurontin had been increased from 300 mg 3 times a day to 6 times daily prior to this. Some orthostatic changes were noted and the patient was rehydrated with IV fluids. Recommended follow-up with his neurologist at [facility] as soon as possible. Was also seen by ENT and placed on antibiotics and told to follow-up with ophthalmology. Troponin negative 1 other labs stable.  Patient comes in today accompanied by his wife. He is convinced he was overmedicated with all his neurological medications. He says he was loopy. He feels much better on the lower doses and has had no further falls. He is going to get a neurologist here in town. He never has dizziness because of his prior surgeries. He keeps a close eye on his blood pressures at home and they're usually 130/70.    Past Medical History:  Diagnosis Date  . Colon polyp   . Coronary artery disease   . Deafness in right ear   . Esophageal stricture   . GERD (gastroesophageal reflux disease)    past history no recent problems  . Hyperchloremia   . Hypertension   . Myocardial infarction    hx. silent MI - no intervention except  oral meds 7 yrs ago  . Rt facial numbness   . 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 Trigeminal neuralgia     Past Surgical History:  Procedure Laterality Date  . CYSTOSCOPY    . ESOPHAGOGASTRODUODENOSCOPY    . ESOPHAGOGASTRODUODENOSCOPY  06/14/2011   Procedure: ESOPHAGOGASTRODUODENOSCOPY (EGD);  Surgeon: Winfield Cunas., MD;  Location: [facility];  Service: Endoscopy;  Laterality: N/A;  . ESOPHAGOGASTRODUODENOSCOPY (EGD) WITH PROPOFOL N/A 08/12/2014   Procedure: ESOPHAGOGASTRODUODENOSCOPY (EGD) WITH PROPOFOL;  Surgeon: Laurence Spates, MD;  Location: [facility];  Service: Endoscopy;  Laterality: N/A;  . gamma knife     3 yrs ago [place] area around trigeminal nerve area"  . PROSTATE BIOPSY    . SAVORY DILATION  06/14/2011   Procedure: SAVORY DILATION;  Surgeon: Winfield Cunas., MD;  Location: [facility];  Service: Endoscopy;  Laterality: N/A;  need xray  . SAVORY DILATION N/A 08/12/2014   Procedure: SAVORY DILATION;  Surgeon: Laurence Spates, MD;  Location: [facility];  Service: Endoscopy;  Laterality: N/A;  . TONSILLECTOMY      child  . TRIGEMINAL NERVE BALLOON DECOMPRESSION Right    right ear deafness    Current Medications: Outpatient Medications Prior to Visit  Medication Sig Dispense Refill  . acetaminophen (TYLENOL) 500 MG tablet Take 1,000 mg by mouth every 6 (six) hours as needed for  mild pain.    Marland Kitchen aspirin 81 MG tablet Take 81 mg by mouth daily.    . finasteride (PROSCAR) 5 MG tablet Take 5 mg by mouth daily.    Marland Kitchen gabapentin (NEURONTIN) 300 MG capsule Take 300 mg by mouth 6 (six) times daily.    . Glucosamine HCl 1500 MG TABS Take 1 tablet by mouth daily.     Marland Kitchen lisinopril (PRINIVIL,ZESTRIL) 10 MG tablet Take 2 tablets (20 mg total) by mouth daily. 180 tablet 3  . metoprolol succinate (TOPROL-XL) 50 MG 24 hr tablet Take 1 tablet (50 mg total) by mouth daily. 90 tablet 3  . Multiple Vitamin (MULTIVITAMIN WITH MINERALS) TABS tablet Take 1 tablet by mouth daily.      . nitroGLYCERIN (NITROSTAT) 0.4 MG SL tablet Place 1 tablet (0.4 mg total) under the tongue every 5 (five) minutes as needed for chest pain. 25 tablet 6  . Omega-3 Fatty Acids (FISH OIL) 1200 MG CAPS Take 2,400 mg by mouth 2 (two) times daily.    Marland Kitchen OVER THE COUNTER MEDICATION Take 1 tablet by mouth daily. Med Name: PRILOSEC    . oxyCODONE (ROXICODONE) 5 MG immediate release tablet Take 5 mg by mouth every 4 (four) hours as needed for severe pain.    . pravastatin (PRAVACHOL) 80 MG tablet Take 1 tablet (80 mg total) by mouth daily. 90 tablet 3  . cyclobenzaprine (FLEXERIL) 5 MG tablet Take 1 tablet (5 mg total) by mouth 3 (three) times daily as needed for muscle spasms. (Patient not taking: Reported on 03/07/2016) 30 tablet 1  . lacosamide (VIMPAT) 50 MG TABS tablet Take 50 mg by mouth 2 (two) times daily.    Marland Kitchen NALTREXONE HCL PO Take 2 mg by mouth at bedtime.    . tiaGABine (GABITRIL) 4 MG tablet Take 8 mg by mouth at bedtime.    . traMADol (ULTRAM) 50 MG tablet Take 0.5 tablets (25 mg total) by mouth every 8 (eight) hours as needed. (Patient not taking: Reported on 03/07/2016) 30 tablet 0   No facility-administered medications prior to visit.      Allergies:   Patient has no known allergies.   Social History   Social History  . Marital status: Married    Spouse name: N/A  . Number of children: N/A  . Years of education: N/A   Social History Main Topics  . Smoking status: Former Smoker    Quit date: 06/14/1967  . Smokeless tobacco: Never Used  . Alcohol use 0.6 oz/week    1 Standard drinks or equivalent per week     Comment: rare  . Drug use: No  . Sexual activity: Not Asked   Other Topics Concern  . None   Social History Narrative  . None     Family History:  The patient's family history includes Colon cancer in his mother.   ROS:   Please see the history of present illness.    Review of Systems  Constitution: Negative.  HENT: Negative.   Cardiovascular: Negative.    Respiratory: Negative.   Endocrine: Negative.   Hematologic/Lymphatic: Negative.   Musculoskeletal: Positive for falls, myalgias and stiffness.  Gastrointestinal: Negative.   Genitourinary: Negative.   Neurological: Negative.    All other systems reviewed and are negative.   PHYSICAL EXAM:   VS:  BP 112/82   Pulse 76   Ht 6\' 5"  (1.956 m)   Wt 225 lb 12.8 oz (102.4 kg)   BMI 26.78 kg/m  Physical Exam  GEN: Well nourished, well developed, in no acute distress  Neck: no JVD, carotid bruits, or masses Cardiac:RRR; no murmurs, rubs, or gallops  Respiratory:  clear to auscultation bilaterally, normal work of breathing GI: soft, nontender, nondistended, + BS Ext: without cyanosis, clubbing, or edema, Good distal pulses bilaterally Psych: euthymic mood, full affect  Wt Readings from Last 3 Encounters:  03/07/16 225 lb 12.8 oz (102.4 kg)  10/02/15 224 lb 12.8 oz (102 kg)  09/08/14 205 lb 12.8 oz (93.4 kg)      Studies/Labs Reviewed:   EKG:  EKG is not ordered today.  EKG reviewed from 02/27/16 normal sinus rhythm with inferior Q waves no acute change Recent Labs: 02/26/2016: ALT 33; BUN 9; Creatinine, Ser 0.92; Hemoglobin 14.6; Platelets 183; Potassium 4.1; Sodium 138   Lipid Panel    Component Value Date/Time   CHOL 131 09/08/2015 0734   TRIG 141 09/08/2015 0734   HDL 34 (L) 09/08/2015 0734   CHOLHDL 3.9 09/08/2015 0734   VLDL 28 09/08/2015 0734   LDLCALC 69 09/08/2015 0734    Additional studies/ records that were reviewed today include:      ASSESSMENT:    1. Fall, subsequent encounter   2. Coronary artery disease involving native coronary artery of native heart without angina pectoris   3. Essential hypertension   4. Trigeminal neuralgia      PLAN:  In order of problems listed above:  Fall in the setting of overmedication for trigeminal neuralgia pain. Patient was mildly orthostatic in the emergency room and given some IV fluids. Blood pressure was pretty  stable here in the office dropped mildly after standing for 3 minutes from 1:30 to 120. Patient does not want to lower his blood pressure medications today. He says he has been doing well on these doses for many years and keeps a close eye on his blood pressure at home. If he has any further falls or concerns he says he will call us otherwise he like to keep his follow up to 1 year.  CAD stable without angina  Essential hypertension controlled with metoprolol and lisinopril  Trigeminal neuralgia with chronic pain despite surgeries. Patient is finding a new neurologist here in town.    Medication Adjustments/Labs and Tests Ordered: Current medicines are reviewed at length with the patient today.  Concerns regarding medicines are outlined above.  Medication changes, Labs and Tests ordered today are listed in the Patient Instructions below. Patient Instructions  Medication Instructions:  The current medical regimen is effective;  continue present plan and medications.  Follow-Up: Follow up in 1 year with Dr. Irish Lack.  You will receive a letter in the mail 2 months before you are due.  Please call us when you receive this letter to schedule your follow up appointment.  If you need a refill on your cardiac medications before your next appointment, please call your pharmacy.  Thank you for choosing [facility]!!        Signed, Ermalinda Barrios, PA-C  03/07/2016 1:17 PM    [facility] [place], [place], [place]  [address] Phone: [phone]; Fax: [phone]

## 2016-03-08 DIAGNOSIS — H40013 Open angle with borderline findings, low risk, bilateral: Secondary | ICD-10-CM | POA: Diagnosis not present

## 2016-03-08 DIAGNOSIS — H35033 Hypertensive retinopathy, bilateral: Secondary | ICD-10-CM | POA: Diagnosis not present

## 2016-03-08 DIAGNOSIS — H35373 Puckering of macula, bilateral: Secondary | ICD-10-CM | POA: Diagnosis not present

## 2016-03-08 DIAGNOSIS — H353133 Nonexudative age-related macular degeneration, bilateral, advanced atrophic without subfoveal involvement: Secondary | ICD-10-CM | POA: Diagnosis not present

## 2016-03-19 ENCOUNTER — Encounter: Payer: Self-pay | Admitting: Neurology

## 2016-03-19 ENCOUNTER — Telehealth: Payer: Self-pay | Admitting: Neurology

## 2016-03-19 ENCOUNTER — Ambulatory Visit (INDEPENDENT_AMBULATORY_CARE_PROVIDER_SITE_OTHER): Payer: Medicare Other | Admitting: Neurology

## 2016-03-19 VITALS — BP 130/79 | HR 75 | Ht 77.0 in | Wt 225.0 lb

## 2016-03-19 DIAGNOSIS — G43709 Chronic migraine without aura, not intractable, without status migrainosus: Secondary | ICD-10-CM

## 2016-03-19 DIAGNOSIS — G509 Disorder of trigeminal nerve, unspecified: Secondary | ICD-10-CM | POA: Diagnosis not present

## 2016-03-19 DIAGNOSIS — G243 Spasmodic torticollis: Secondary | ICD-10-CM

## 2016-03-19 NOTE — Progress Notes (Addendum)
[facility]    Provider:  Dr Jaynee Eagles Referring Provider: Hulan Fess, MD Primary Care Physician:  Gennette Pac, MD  CC:  Trigeminal Neuralgia  HPI:  Billy Knox is a 76 y.o. male here as a referral from Dr. Rex Kras for trigeminal neuralgia. 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 He is status post gamma knife radiosurgical rhizotomy in 2012 and recent dorsal root entry zone surgery. He has had 4 surgeries as far back as the 90s. Started 25 years ago first surgery was in 1992. He has seen in December at [facility] correct and his neurontin was increased and he had a fall. He lost feeling in his left lip after recent fall (see CT below). Yesterday he had pain like a hot poker through the scalp (Right high parietal lobe) and now pain around the right side of the ear. Since the DREZ he still gets flashes of pain in the right side of the face. Feels like someone has hit him all the time. Throbbing and pounding. With light and sound sensitivity and nausea. No medication overuse. He says the trigeminal nerve pain shoots into all three zones (v1,v2,v3). When he first got the trigeminal neuralgia food would trigger it. He has also been to the head of Neurology at [facility] as well as [facility]. Unknown triggers, sporadic, sometimes touching his face with cause the pain, turning the head, wind and chewing. Lightning, severe, brief. He has an aching pain on the right which is constant. Like a pressure. He has daily headaches and half of them a month are migrainous (unilateral right side of head, light, sound sensitivity, pounding, throbbing, nausea) ongoing at this frequency for years. No aura. No medication overuse. He also has neck pain and stiff muscles on the right with decreased ROM, slowly progressive, cannot use muscle relaxers due to side  effects and sedation and risk of falls, has tried massage and PT without help, pain and decreased ROM.   Meds tried include: Oxcarbazepine, Vimpat, gabapentin, clonazepam, tiagabine, oxycodone, flexeril, tramadol, naltrexone, Tegretol(did not help).   Reviewed notes, labs and imaging from outside physicians, which showed:   CBC showed elevated white blood cells 3 weeks ago [id number] with neutrophilic predominance otherwise normal. CMP showed normal sodium, chloride 100, glucose 108, total protein 6 otherwise unremarkable.  Personally reviewed images CT of the head and agree with the following:  IMPRESSION: CT head 02/26/2016 (in ED for a fall): Atrophy with supratentorial small vessel disease. No intracranial mass hemorrhage, or extra-axial fluid collection. No acute infarct evident. Previous surgical removal a portion of the lateral right occipital bone. Areas of arterial vascular calcification noted.  Primary diagnosis patient is suffering from trigeminal neuralgia for several decades. He was operated with microvascular decompression several times also treated with gamma knife without much effect. He recently stopped oxycodone. He was recently treated with DREZ by Dr. Mearl Latin and is complaining about postprocedural resurgence of neuralgic pain in V2 and also retro-orbital pain always on the right side. Diagnosed with postprocedural flare up. They added low-dose naltrexone increased gabapentin and also added Vimpat. Repeating his neuro imaging was suggested.   Review of Systems: Patient complains of symptoms per HPI as well as the following symptoms: No CP, no SOB. Pertinent negatives per HPI. All others negative.   Social History   Social History  . Marital status: Married    Spouse name: N/A  . Number of children:  1  . Years of education: 62   Occupational History  . Retired    Social History Main Topics  . Smoking status: Former Smoker    Quit date: 06/14/1967  . Smokeless tobacco:  Never Used  . Alcohol use 0.6 oz/week    1 Standard drinks or equivalent per week     Comment: rare  . Drug use: No  . Sexual activity: Not on file   Other Topics Concern  . Not on file   Social History Narrative   Lives at home w/ his wife   Right-handed   Caffeine: 2 cups per day    Family History  Problem Relation Age of Onset  . Colon cancer Mother   . Heart disease Mother   . Alcohol abuse Father     Past Medical History:  Diagnosis Date  . Colon polyp   . Coronary artery disease   . Deafness in right ear   . Esophageal stricture   . GERD (gastroesophageal reflux disease)    past history no recent problems  . Hyperchloremia   . Hypertension   . Myocardial infarction    hx. silent MI - no intervention except oral meds 7 yrs ago  . Rt facial numbness   . 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 Trigeminal neuralgia     Past Surgical History:  Procedure Laterality Date  . CYSTOSCOPY    . ESOPHAGOGASTRODUODENOSCOPY    . ESOPHAGOGASTRODUODENOSCOPY  06/14/2011   Procedure: ESOPHAGOGASTRODUODENOSCOPY (EGD);  Surgeon: Winfield Cunas., MD;  Location: [facility];  Service: Endoscopy;  Laterality: N/A;  . ESOPHAGOGASTRODUODENOSCOPY (EGD) WITH PROPOFOL N/A 08/12/2014   Procedure: ESOPHAGOGASTRODUODENOSCOPY (EGD) WITH PROPOFOL;  Surgeon: Laurence Spates, MD;  Location: [facility];  Service: Endoscopy;  Laterality: N/A;  . gamma knife     3 yrs ago [place] area around trigeminal nerve area"  . PROSTATE BIOPSY    . SAVORY DILATION  06/14/2011   Procedure: SAVORY DILATION;  Surgeon: Winfield Cunas., MD;  Location: [facility];  Service: Endoscopy;  Laterality: N/A;  need xray  . SAVORY DILATION N/A 08/12/2014   Procedure: SAVORY DILATION;  Surgeon: Laurence Spates, MD;  Location: [facility];  Service: Endoscopy;  Laterality: N/A;  . TONSILLECTOMY      child  . TRIGEMINAL NERVE BALLOON DECOMPRESSION Right    right ear deafness    Current Outpatient Prescriptions   Medication Sig Dispense Refill  . acetaminophen (TYLENOL) 500 MG tablet Take 1,000 mg by mouth every 6 (six) hours as needed for mild pain.    Marland Kitchen aspirin 81 MG tablet Take 81 mg by mouth daily.    . finasteride (PROSCAR) 5 MG tablet Take 5 mg by mouth daily.    Marland Kitchen gabapentin (NEURONTIN) 300 MG capsule Take 300 mg by mouth 3 (three) times daily.     . Glucosamine HCl 1500 MG TABS Take 1 tablet by mouth daily.     Marland Kitchen lacosamide (VIMPAT) 50 MG TABS tablet Take 50 mg by mouth 2 (two) times daily.    Marland Kitchen lisinopril (PRINIVIL,ZESTRIL) 10 MG tablet Take 2 tablets (20 mg total) by mouth daily. 180 tablet 3  . metoprolol succinate (TOPROL-XL) 50 MG 24 hr tablet Take 1 tablet (50 mg total) by mouth daily. 90 tablet 3  . Multiple Vitamin (MULTIVITAMIN WITH MINERALS) TABS tablet Take 1 tablet by mouth daily.    . nitroGLYCERIN (NITROSTAT) 0.4 MG SL tablet Place 1 tablet (0.4 mg total) under the tongue every 5 (  five) minutes as needed for chest pain. 25 tablet 6  . Omega-3 Fatty Acids (FISH OIL) 1200 MG CAPS Take 2,400 mg by mouth 2 (two) times daily.    . pravastatin (PRAVACHOL) 80 MG tablet Take 1 tablet (80 mg total) by mouth daily. 90 tablet 3   No current facility-administered medications for this visit.     Allergies as of 03/19/2016  . (No Known Allergies)    Vitals: BP 130/79   Pulse 75   Ht 6\' 5"  (1.956 m)   Wt 225 lb (102.1 kg)   BMI 26.68 kg/m  Last Weight:  Wt Readings from Last 1 Encounters:  03/29/16 225 lb (102.1 kg)   Last Height:   Ht Readings from Last 1 Encounters:  03/29/16 6\' 5"  (1.956 m)   Physical exam: Exam: Gen: NAD, conversant, well nourised, obese, well groomed                     CV: RRR, no MRG. No Carotid Bruits. No peripheral edema, warm, nontender Eyes: Conjunctivae clear without exudates or hemorrhage  Neuro: Detailed Neurologic Exam  Speech:    Speech is normal; fluent and spontaneous with normal comprehension.  Cognition:    The patient is oriented  to person, place, and time;     recent and remote memory intact;     language fluent;     normal attention, concentration,     fund of knowledge Cranial Nerves:    The pupils are equal, round, and reactive to light. The fundi are normal and spontaneous venous pulsations are present. Visual fields are full to finger confrontation. Extraocular movements are intact. Trigeminal sensation is intact and the muscles of mastication are normal. The face is symmetric. The palate elevates in the midline. Hearing intact. Voice is normal. Shoulder shrug is normal. The tongue has normal motion without fasciculations.   Coordination:    Normal finger to nose and heel to shin. Normal rapid alternating movements.   Gait:    Heel-toe and tandem gait are normal.   Motor Observation:    Elevated right shoulder, hypertrophied cervical spinal muscles, left laterocollis 30 degrees from vertical, right torticollis    Tone:    Normal muscle tone.    Posture:    Posture is normal. normal erect    Strength:    Strength is V/V in the upper and lower limbs.      Sensation: intact to LT     Reflex Exam:  DTR's:    Deep tendon reflexes in the upper and lower extremities are normal bilaterally.   Toes:    The toes are downgoing bilaterally.   Clonus:    Clonus is absent.       Assessment/Plan: [age] year old male here with 25+ years of trigeminal neuralgia and 4 surgeries, migraines,  failed multiple medications. Will try botulinum toxin, there are many case series showing significant improvement after treatment for trigeminal neuralgia and for migraines. Gave patient and wife literature from [facility], he is quite knowledgeable and I think he will be able to read this medical literature. He will return next week, will try to get Xeomin samples for him for a series of injections for cervical dystonia. In the meantime will try nerve blocks.  Recommend botox for migraine and Xeomin for cervical dystonia.  Would also benefit from Botox for migraine.   Cc: Dr. Rex Kras  Plan for cervical dystonia Xeomin: request 200 units  Right Trap  50 - 75  Right Splenius Capitis                   25  Right Splenius Cervicis                  25  right Longissimus Capitus/cervicis  [place], MD  [facility] [address] [place], [place] [address]  Phone [phone] Fax [phone]

## 2016-03-19 NOTE — Patient Instructions (Signed)
Remember to drink plenty of fluid, eat healthy meals and do not skip any meals. Try to eat protein with a every meal and eat a healthy snack such as fruit or nuts in between meals. Try to keep a regular sleep-wake schedule and try to exercise daily, particularly in the form of walking, 20-30 minutes a day, if you can.   As far as your medications are concerned, I would like to suggest: Botulinum Toxin (Botox or Xeomin)  I would like to see you back next week, sooner if we need to. Please call us with any interim questions, concerns, problems, updates or refill requests.   Our phone number is [phone]. We also have an after hours call service for urgent matters and there is a physician on-call for urgent questions. For any emergencies you know to call 911 or go to the nearest emergency room

## 2016-03-19 NOTE — Telephone Encounter (Signed)
Please call for Xeomin samples.

## 2016-03-29 ENCOUNTER — Ambulatory Visit (INDEPENDENT_AMBULATORY_CARE_PROVIDER_SITE_OTHER): Payer: Medicare Other | Admitting: Neurology

## 2016-03-29 VITALS — BP 155/86 | HR 68 | Ht 72.5 in | Wt 225.0 lb

## 2016-03-29 DIAGNOSIS — G243 Spasmodic torticollis: Secondary | ICD-10-CM

## 2016-03-29 DIAGNOSIS — G43709 Chronic migraine without aura, not intractable, without status migrainosus: Secondary | ICD-10-CM

## 2016-03-29 DIAGNOSIS — G500 Trigeminal neuralgia: Secondary | ICD-10-CM | POA: Diagnosis not present

## 2016-03-31 ENCOUNTER — Telehealth: Payer: Self-pay | Admitting: Neurology

## 2016-03-31 DIAGNOSIS — G43709 Chronic migraine without aura, not intractable, without status migrainosus: Secondary | ICD-10-CM | POA: Insufficient documentation

## 2016-03-31 NOTE — Progress Notes (Signed)
Performed by Dr. Jaynee Eagles M.D. All procedures a documented blood were medically necessary, reasonable and appropriate based on the patient's history, medical diagnosis and physician opinion. Verbal informed consent was obtained from the patient, patient was informed of potential risk of procedure, including bruising, bleeding, hematoma formation, infection, muscle weakness, muscle pain, numbness, transient hypertension, transient hyperglycemia and transient insomnia among others. All areas injected were topically clean with isopropyl rubbing alcohol. Nonsterile nonlatex gloves were worn during the procedure.  1. 20553: Right Splenius Capitis, Right Splenius Cervicis, right Longissimus Capitus/cervicis   2. Trigeminal nerve block (64400): Trogeminal nerve site was identified anterior to the Tragus. Medication was injected into the right trigeminal nerve areas. Patient's condition is associated with inflammation of the Trigeminal nerve and associated muscle groups. Injection was deemed medically necessary, reasonable and appropriate. Injection represents a separate and unique surgical service.   3. Auriculotemporal (73710): The Auriculotemporal nerve site was identified along the posterior margin of the sternocleidomastoid muscle toward the base of the ear. Medication was injected into the right radicular temporal nerve areas. Patient's condition is associated with inflammation of the Auriculotemporal Nerve and associated muscle groups. Injection was deemed medically necessary, reasonable and appropriate. Injection represents a separate and unique surgical service.

## 2016-03-31 NOTE — Telephone Encounter (Signed)
Billy Knox can you please get me 2 botulinum request forms for this patient for both migraines and cervical dystonia? Thanks!

## 2016-04-01 ENCOUNTER — Ambulatory Visit: Payer: Self-pay | Admitting: Neurology

## 2016-04-08 ENCOUNTER — Other Ambulatory Visit: Payer: Self-pay | Admitting: Family Medicine

## 2016-04-08 ENCOUNTER — Ambulatory Visit
Admission: RE | Admit: 2016-04-08 | Discharge: 2016-04-08 | Disposition: A | Discharge status: Home / Self Care | Payer: Medicare Other | Source: Ambulatory Visit | Attending: Family Medicine | Admitting: Family Medicine

## 2016-04-08 DIAGNOSIS — M549 Dorsalgia, unspecified: Secondary | ICD-10-CM

## 2016-04-08 DIAGNOSIS — S199XXA Unspecified injury of neck, initial encounter: Secondary | ICD-10-CM | POA: Diagnosis not present

## 2016-04-08 DIAGNOSIS — S299XXA Unspecified injury of thorax, initial encounter: Secondary | ICD-10-CM | POA: Diagnosis not present

## 2016-04-08 DIAGNOSIS — M542 Cervicalgia: Secondary | ICD-10-CM

## 2016-04-08 DIAGNOSIS — G500 Trigeminal neuralgia: Secondary | ICD-10-CM | POA: Diagnosis not present

## 2016-04-08 DIAGNOSIS — M545 Low back pain: Secondary | ICD-10-CM | POA: Diagnosis not present

## 2016-04-08 DIAGNOSIS — S3992XA Unspecified injury of lower back, initial encounter: Secondary | ICD-10-CM | POA: Diagnosis not present

## 2016-04-09 DIAGNOSIS — M9901 Segmental and somatic dysfunction of cervical region: Secondary | ICD-10-CM | POA: Diagnosis not present

## 2016-04-09 DIAGNOSIS — M530 Cervicocranial syndrome: Secondary | ICD-10-CM | POA: Diagnosis not present

## 2016-04-09 DIAGNOSIS — M9903 Segmental and somatic dysfunction of lumbar region: Secondary | ICD-10-CM | POA: Diagnosis not present

## 2016-04-09 DIAGNOSIS — M47816 Spondylosis without myelopathy or radiculopathy, lumbar region: Secondary | ICD-10-CM | POA: Diagnosis not present

## 2016-04-11 DIAGNOSIS — M9903 Segmental and somatic dysfunction of lumbar region: Secondary | ICD-10-CM | POA: Diagnosis not present

## 2016-04-11 DIAGNOSIS — M47816 Spondylosis without myelopathy or radiculopathy, lumbar region: Secondary | ICD-10-CM | POA: Diagnosis not present

## 2016-04-11 DIAGNOSIS — M530 Cervicocranial syndrome: Secondary | ICD-10-CM | POA: Diagnosis not present

## 2016-04-11 DIAGNOSIS — M9901 Segmental and somatic dysfunction of cervical region: Secondary | ICD-10-CM | POA: Diagnosis not present

## 2016-04-15 DIAGNOSIS — M9901 Segmental and somatic dysfunction of cervical region: Secondary | ICD-10-CM | POA: Diagnosis not present

## 2016-04-15 DIAGNOSIS — M47816 Spondylosis without myelopathy or radiculopathy, lumbar region: Secondary | ICD-10-CM | POA: Diagnosis not present

## 2016-04-15 DIAGNOSIS — M9903 Segmental and somatic dysfunction of lumbar region: Secondary | ICD-10-CM | POA: Diagnosis not present

## 2016-04-15 DIAGNOSIS — M530 Cervicocranial syndrome: Secondary | ICD-10-CM | POA: Diagnosis not present

## 2016-04-17 DIAGNOSIS — M530 Cervicocranial syndrome: Secondary | ICD-10-CM | POA: Diagnosis not present

## 2016-04-17 DIAGNOSIS — M9903 Segmental and somatic dysfunction of lumbar region: Secondary | ICD-10-CM | POA: Diagnosis not present

## 2016-04-17 DIAGNOSIS — M9901 Segmental and somatic dysfunction of cervical region: Secondary | ICD-10-CM | POA: Diagnosis not present

## 2016-04-17 DIAGNOSIS — M47816 Spondylosis without myelopathy or radiculopathy, lumbar region: Secondary | ICD-10-CM | POA: Diagnosis not present

## 2016-04-18 DIAGNOSIS — M47816 Spondylosis without myelopathy or radiculopathy, lumbar region: Secondary | ICD-10-CM | POA: Diagnosis not present

## 2016-04-18 DIAGNOSIS — M530 Cervicocranial syndrome: Secondary | ICD-10-CM | POA: Diagnosis not present

## 2016-04-18 DIAGNOSIS — M9901 Segmental and somatic dysfunction of cervical region: Secondary | ICD-10-CM | POA: Diagnosis not present

## 2016-04-18 DIAGNOSIS — M9903 Segmental and somatic dysfunction of lumbar region: Secondary | ICD-10-CM | POA: Diagnosis not present

## 2016-04-22 ENCOUNTER — Encounter: Payer: Self-pay | Admitting: Neurology

## 2016-04-22 ENCOUNTER — Telehealth: Payer: Self-pay | Admitting: Neurology

## 2016-04-22 DIAGNOSIS — M47816 Spondylosis without myelopathy or radiculopathy, lumbar region: Secondary | ICD-10-CM | POA: Diagnosis not present

## 2016-04-22 DIAGNOSIS — M9901 Segmental and somatic dysfunction of cervical region: Secondary | ICD-10-CM | POA: Diagnosis not present

## 2016-04-22 DIAGNOSIS — M530 Cervicocranial syndrome: Secondary | ICD-10-CM | POA: Diagnosis not present

## 2016-04-22 DIAGNOSIS — M9903 Segmental and somatic dysfunction of lumbar region: Secondary | ICD-10-CM | POA: Diagnosis not present

## 2016-04-22 NOTE — Telephone Encounter (Signed)
Billy Knox, would you call [facility] and see if they can compound some numbing cream for me? [phone] and also ask how much it would be for this patient? The prescription will be for lidocaine/tetracaine 23%/7% gel 100gm jar. If we think medicare will pay for it that is great. It is for allodynia and trigeminal neuralgia. Otherwise we can tell [name] the out of pocket price thanks.

## 2016-04-22 NOTE — Telephone Encounter (Signed)
Pt received Xeomin injections on 03/29/16 for cervical dystonia, trigeminal nerve block. Don't see mention of topical pain reliever.

## 2016-04-22 NOTE — Telephone Encounter (Signed)
Pt called said he was reading OV 03/29/16 on mychart and noticed his height charted at 6'5", pt said he is 6'1/2". He is asking if that can be corrected

## 2016-04-22 NOTE — Telephone Encounter (Addendum)
Pt called said he cannot tell a lot of impovement with botox, it has been a little over 3 weeks. He also said Dr A was going to order a topical medication to help deaden the pain. Please call [phone]

## 2016-04-22 NOTE — Telephone Encounter (Signed)
Pt's height corrected as requested.

## 2016-04-23 DIAGNOSIS — H33103 Unspecified retinoschisis, bilateral: Secondary | ICD-10-CM | POA: Diagnosis not present

## 2016-04-23 DIAGNOSIS — H40013 Open angle with borderline findings, low risk, bilateral: Secondary | ICD-10-CM | POA: Diagnosis not present

## 2016-04-23 DIAGNOSIS — H35033 Hypertensive retinopathy, bilateral: Secondary | ICD-10-CM | POA: Diagnosis not present

## 2016-04-23 DIAGNOSIS — H353133 Nonexudative age-related macular degeneration, bilateral, advanced atrophic without subfoveal involvement: Secondary | ICD-10-CM | POA: Diagnosis not present

## 2016-04-23 NOTE — Telephone Encounter (Signed)
Called and spoke to pharmacist who asked if compound cream would be for in-office or home use. Stated that dose requested is usually used in office d/t cardiac concerns if pt applied too much at home. However, could compound cream if recommended by MD. Michela Pitcher that 100 gm would cost patient $68 out of pocket. Pharmacy does not file insurance but they can give pt form to see if Medicare reimburses any of the cost.

## 2016-04-24 DIAGNOSIS — M9901 Segmental and somatic dysfunction of cervical region: Secondary | ICD-10-CM | POA: Diagnosis not present

## 2016-04-24 DIAGNOSIS — M9903 Segmental and somatic dysfunction of lumbar region: Secondary | ICD-10-CM | POA: Diagnosis not present

## 2016-04-24 DIAGNOSIS — M47816 Spondylosis without myelopathy or radiculopathy, lumbar region: Secondary | ICD-10-CM | POA: Diagnosis not present

## 2016-04-24 DIAGNOSIS — M530 Cervicocranial syndrome: Secondary | ICD-10-CM | POA: Diagnosis not present

## 2016-04-24 MED ORDER — NONFORMULARY OR COMPOUNDED ITEM: 0 refills | Status: DC

## 2016-04-24 NOTE — Telephone Encounter (Signed)
Called pt w/ cost, Radio broadcast assistant and med application instructions including possible side effects. He would like to give it a try.

## 2016-04-24 NOTE — Telephone Encounter (Signed)
Called again to [facility]. Rx for compounded item printed, signed and faxed to F # [phone].

## 2016-04-24 NOTE — Addendum Note (Signed)
Addended by: Monte Fantasia on: 04/24/2016 03:59 PM   Modules accepted: Orders

## 2016-04-24 NOTE — Telephone Encounter (Signed)
Yes go ahead and order it if patient is ok. We will have to instruct him to use it sparingly and only a few times a day. Thanks Delsa Sale!

## 2016-05-06 DIAGNOSIS — M9901 Segmental and somatic dysfunction of cervical region: Secondary | ICD-10-CM | POA: Diagnosis not present

## 2016-05-06 DIAGNOSIS — M47816 Spondylosis without myelopathy or radiculopathy, lumbar region: Secondary | ICD-10-CM | POA: Diagnosis not present

## 2016-05-06 DIAGNOSIS — M9903 Segmental and somatic dysfunction of lumbar region: Secondary | ICD-10-CM | POA: Diagnosis not present

## 2016-05-06 DIAGNOSIS — M530 Cervicocranial syndrome: Secondary | ICD-10-CM | POA: Diagnosis not present

## 2016-05-08 DIAGNOSIS — M530 Cervicocranial syndrome: Secondary | ICD-10-CM | POA: Diagnosis not present

## 2016-05-08 DIAGNOSIS — M9901 Segmental and somatic dysfunction of cervical region: Secondary | ICD-10-CM | POA: Diagnosis not present

## 2016-05-08 DIAGNOSIS — M9903 Segmental and somatic dysfunction of lumbar region: Secondary | ICD-10-CM | POA: Diagnosis not present

## 2016-05-08 DIAGNOSIS — M47816 Spondylosis without myelopathy or radiculopathy, lumbar region: Secondary | ICD-10-CM | POA: Diagnosis not present

## 2016-05-13 DIAGNOSIS — M9901 Segmental and somatic dysfunction of cervical region: Secondary | ICD-10-CM | POA: Diagnosis not present

## 2016-05-13 DIAGNOSIS — M47816 Spondylosis without myelopathy or radiculopathy, lumbar region: Secondary | ICD-10-CM | POA: Diagnosis not present

## 2016-05-13 DIAGNOSIS — M9903 Segmental and somatic dysfunction of lumbar region: Secondary | ICD-10-CM | POA: Diagnosis not present

## 2016-05-13 DIAGNOSIS — M530 Cervicocranial syndrome: Secondary | ICD-10-CM | POA: Diagnosis not present

## 2016-05-15 DIAGNOSIS — M9901 Segmental and somatic dysfunction of cervical region: Secondary | ICD-10-CM | POA: Diagnosis not present

## 2016-05-15 DIAGNOSIS — M9903 Segmental and somatic dysfunction of lumbar region: Secondary | ICD-10-CM | POA: Diagnosis not present

## 2016-05-15 DIAGNOSIS — M530 Cervicocranial syndrome: Secondary | ICD-10-CM | POA: Diagnosis not present

## 2016-05-15 DIAGNOSIS — M47816 Spondylosis without myelopathy or radiculopathy, lumbar region: Secondary | ICD-10-CM | POA: Diagnosis not present

## 2016-07-01 ENCOUNTER — Telehealth: Payer: Self-pay | Admitting: Neurology

## 2016-07-01 MED ORDER — GABAPENTIN 300 MG PO CAPS: 600.0000 mg | ORAL_CAPSULE | Freq: Three times a day (TID) | ORAL | 11 refills | Status: DC

## 2016-07-01 NOTE — Telephone Encounter (Signed)
Pt said trigeminal neuralgia is worse. He is taking gabapentin 4tabs 300mg  tabs every 24 hours. Said he could not tell any difference in the pain level. Pt said the pain on the back of the head where he had surgery has not ceased.

## 2016-07-01 NOTE — Telephone Encounter (Signed)
Seen as a new pt 03/19/16 for trigeminal neuralgia. Returned on 03/29/16 for Xeomin/nerve blocks. Topical compound cream was prescribed 03/31/16. He currently has follow-up scheduled 08/05/16 for worsening neuralgia.

## 2016-07-01 NOTE — Addendum Note (Signed)
Addended by: Monte Fantasia on: 07/01/2016 06:11 PM   Modules accepted: Orders

## 2016-07-01 NOTE — Telephone Encounter (Signed)
Called pt back and let him know that he can increase gabapentin up to 600 mg 3x/day. He feels that doubling the dose may be too much but does have 100 mg tabs left over from previous physician. Plans on trying 400 mg at bedtime tonight. Agreed to call back in a couple of weeks w/ update. Voiced appreciation for call.

## 2016-07-01 NOTE — Telephone Encounter (Signed)
Can increase gabapentin watch for sedation thanks

## 2016-08-05 ENCOUNTER — Ambulatory Visit: Payer: Medicare Other | Admitting: Neurology

## 2016-08-05 ENCOUNTER — Telehealth: Payer: Self-pay

## 2016-08-05 NOTE — Telephone Encounter (Signed)
Pt no-showed his follow-up appt today.

## 2016-08-06 ENCOUNTER — Encounter: Payer: Self-pay | Admitting: Neurology

## 2016-08-08 ENCOUNTER — Other Ambulatory Visit: Payer: Self-pay

## 2016-08-08 MED ORDER — METOPROLOL SUCCINATE ER 50 MG PO TB24: 50.0000 mg | ORAL_TABLET | Freq: Every day | ORAL | 3 refills | Status: DC

## 2016-08-13 MED ORDER — METOPROLOL SUCCINATE ER 50 MG PO TB24: 50.0000 mg | ORAL_TABLET | Freq: Every day | ORAL | 1 refills | Status: DC

## 2016-08-13 NOTE — Telephone Encounter (Signed)
Resent medication to mail order pharmacy. Confirmation received.

## 2016-09-24 ENCOUNTER — Encounter: Payer: Self-pay | Admitting: Neurology

## 2016-09-24 ENCOUNTER — Ambulatory Visit (INDEPENDENT_AMBULATORY_CARE_PROVIDER_SITE_OTHER): Payer: Medicare Other | Admitting: Neurology

## 2016-09-24 VITALS — BP 125/83 | HR 73 | Wt 235.4 lb

## 2016-09-24 DIAGNOSIS — G500 Trigeminal neuralgia: Secondary | ICD-10-CM

## 2016-09-24 MED ORDER — LAMOTRIGINE 25 MG PO TABS: ORAL_TABLET | ORAL | 3 refills | Status: DC

## 2016-09-24 NOTE — Patient Instructions (Signed)
Remember to drink plenty of fluid, eat healthy meals and do not skip any meals. Try to eat protein with a every meal and eat a healthy snack such as fruit or nuts in between meals. Try to keep a regular sleep-wake schedule and try to exercise daily, particularly in the form of walking, 20-30 minutes a day, if you can.   As far as your medications are concerned, I would like to suggest:  - start Lamotrigine with a goal dose of 100mg  by mouth twice a day Week 1: Take 25mg  (one pill) by mouth at night every day Week 2: Take 25mg  (one pill) by mouth in the morning and night every day Week 3: Take 50mg  (two pills) by mouth every night and 25mg  every morning  Week 4: Take 50mg  (two pills) by mouth every night and every morning  Week 5: Take 75mg  (three pills) every night and take 50 mg (two pills) every morning  Week 6: Take 75mg  (three pills) every night and morning  Week 7: Take 100mg  (four pills) every night and take 75mg  (three pills) every morning  Week 8: Take 100mg  (four pills) every night and morning  Please call with any questions or if you develop a rash call immediately and stop taking the Lamotrigine.   I would like to see you back in 10 weeks, sooner if we need to. Please call us with any interim questions, concerns, problems, updates or refill requests.   Our phone number is [phone]. We also have an after hours call service for urgent matters and there is a physician on-call for urgent questions. For any emergencies you know to call 911 or go to the nearest emergency room  Lamotrigine tablets What is this medicine? LAMOTRIGINE (la MOE Hendricks Limes) is used to control seizures in adults and children with epilepsy and Lennox-Gastaut syndrome. It is also used in adults to treat bipolar disorder. This medicine may be used for other purposes; ask your health care provider or pharmacist if you have questions. COMMON BRAND NAME(S): Lamictal What should I tell my health care provider before  I take this medicine? They need to know if you have any of these conditions: -a history of depression or bipolar disorder -aseptic meningitis during prior use of lamotrigine -folate deficiency -kidney disease -liver disease -suicidal thoughts, plans, or attempt; a previous suicide attempt by you or a family member -an unusual or allergic reaction to lamotrigine or other seizure medications, other medicines, foods, dyes, or preservatives -pregnant or trying to get pregnant -breast-feeding How should I use this medicine? Take this medicine by mouth with a glass of water. Follow the directions on the prescription label. Do not chew these tablets. If this medicine upsets your stomach, take it with food or milk. Take your doses at regular intervals. Do not take your medicine more often than directed. A special MedGuide will be given to you by the pharmacist with each new prescription and refill. Be sure to read this information carefully each time. Talk to your pediatrician regarding the use of this medicine in children. While this drug may be prescribed for children as young as 2 years for selected conditions, precautions do apply. Overdosage: If you think you have taken too much of this medicine contact a poison control center or emergency room at once. NOTE: This medicine is only for you. Do not share this medicine with others. What if I miss a dose? If you miss a dose, take it as soon as you can.  If it is almost time for your next dose, take only that dose. Do not take double or extra doses. What may interact with this medicine? -carbamazepine -male hormones, including contraceptive or birth control pills -methotrexate -phenobarbital -phenytoin -primidone -pyrimethamine -rifampin -trimethoprim -valproic acid This list may not describe all possible interactions. Give your health care provider a list of all the medicines, herbs, non-prescription drugs, or dietary supplements you use.  Also tell them if you smoke, drink alcohol, or use illegal drugs. Some items may interact with your medicine. What should I watch for while using this medicine? Visit your doctor or health care professional for regular checks on your progress. If you take this medicine for seizures, wear a Medic Alert bracelet or necklace. Carry an identification card with information about your condition, medicines, and doctor or health care professional. It is important to take this medicine exactly as directed. When first starting treatment, your dose will need to be adjusted slowly. It may take weeks or months before your dose is stable. You should contact your doctor or health care professional if your seizures get worse or if you have any new types of seizures. Do not stop taking this medicine unless instructed by your doctor or health care professional. Stopping your medicine suddenly can increase your seizures or their severity. Contact your doctor or health care professional right away if you develop a rash while taking this medicine. Rashes may be very severe and sometimes require treatment in the hospital. Deaths from rashes have occurred. Serious rashes occur more often in children than adults taking this medicine. It is more common for these serious rashes to occur during the first 2 months of treatment, but a rash can occur at any time. You may get drowsy, dizzy, or have blurred vision. Do not drive, use machinery, or do anything that needs mental alertness until you know how this medicine affects you. To reduce dizzy or fainting spells, do not sit or stand up quickly, especially if you are an older patient. Alcohol can increase drowsiness and dizziness. Avoid alcoholic drinks. If you are taking this medicine for bipolar disorder, it is important to report any changes in your mood to your doctor or health care professional. If your condition gets worse, you get mentally depressed, feel very hyperactive or manic,  have difficulty sleeping, or have thoughts of hurting yourself or committing suicide, you need to get help from your health care professional right away. If you are a caregiver for someone taking this medicine for bipolar disorder, you should also report these behavioral changes right away. The use of this medicine may increase the chance of suicidal thoughts or actions. Pay special attention to how you are responding while on this medicine. Your mouth may get dry. Chewing sugarless gum or sucking hard candy, and drinking plenty of water may help. Contact your doctor if the problem does not go away or is severe. Women who become pregnant while using this medicine may enroll in the [place] Pregnancy Registry by calling [phone]. This registry collects information about the safety of antiepileptic drug use during pregnancy. What side effects may I notice from receiving this medicine? Side effects that you should report to your doctor or health care professional as soon as possible: -allergic reactions like skin rash, itching or hives, swelling of the face, lips, or tongue -blurred or double vision -difficulty walking or controlling muscle movements -fever -headache, stiff neck, and sensitivity to light -painful sores in the mouth, eyes,  or nose -redness, blistering, peeling or loosening of the skin, including inside the mouth -severe muscle pain -swollen lymph glands -uncontrollable eye movements -unusual bruising or bleeding -unusually weak or tired -vomiting -worsening of mood, thoughts or actions of suicide or dying -yellowing of the eyes or skin Side effects that usually do not require medical attention (report to your doctor or health care professional if they continue or are bothersome): -diarrhea or constipation -difficulty sleeping -nausea -tremors This list may not describe all possible side effects. Call your doctor for medical advice about side  effects. You may report side effects to FDA at 1-800-FDA-1088. Where should I keep my medicine? Keep out of reach of children. Store at room temperature between 15 and 30 degrees C (59 and 86 degrees F). Throw away any unused medicine after the expiration date. NOTE: This sheet is a summary. It may not cover all possible information. If you have questions about this medicine, talk to your doctor, pharmacist, or health care provider.  2018 Elsevier/Gold Standard (2015-02-02 09:29:40)

## 2016-09-24 NOTE — Progress Notes (Signed)
[facility]    Provider:  Dr Jaynee Eagles Referring Provider: Hulan Fess, MD Primary Care Physician:  Hulan Fess, MD   CC:  Trigeminal Neuralgia  Interval history 09/24/2016: botulinum toxin in the right trigeminal area did not help. He has continued pain in the are of the scar and now getting more pain in the occipital area, sharp, burning since the surgery suspect injury to the occipital nerve. He takes a 300mg  gabapentin every 5.5 hours which helps. He has not tried Lamictal, baclofen, topiramate, valproate. Will try Lamictal  HPI:  Billy Knox is a 76 y.o. male here as a referral from Dr. Rex Kras for trigeminal neuralgia. 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 He is status post gamma knife radiosurgical rhizotomy in 2012 and recent dorsal root entry zone surgery. He has had 4 surgeries as far back as the 90s. Started 25 years ago first surgery was in 1992. He has seen in December at [facility] correct and his neurontin was increased and he had a fall. He lost feeling in his left lip after recent fall (see CT below). Yesterday he had pain like a hot poker through the scalp (Right high parietal lobe) and now pain around the right side of the ear. Since the DREZ he still gets flashes of pain in the right side of the face. Feels like someone has hit him all the time. Throbbing and pounding. With light and sound sensitivity and nausea. No medication overuse. He says the trigeminal nerve pain shoots into all three zones (v1,v2,v3). When he first got the trigeminal neuralgia food would trigger it. He has also been to the head of Neurology at [facility] as well as [facility]. Unknown triggers, sporadic, sometimes touching his face with cause the pain, turning the head, wind and chewing. Lightning, severe, brief. He has an aching pain on the right  which is constant. Like a pressure. He has daily headaches and half of them a month are migrainous (unilateral right side of head, light, sound sensitivity, pounding, throbbing, nausea) ongoing at this frequency for years. No aura. No medication overuse. He also has neck pain and stiff muscles on the right with decreased ROM, slowly progressive, cannot use muscle relaxers due to side effects and sedation and risk of falls, has tried massage and PT without help, pain and decreased ROM.   Meds tried include: Oxcarbazepine, Vimpat, gabapentin, clonazepam, tiagabine, oxycodone, flexeril, tramadol, naltrexone, Tegretol(did not help).   Reviewed notes, labs and imaging from outside physicians, which showed:   CBC showed elevated white blood cells 3 weeks ago 14.9 with neutrophilic predominance otherwise normal. CMP showed normal sodium, chloride 100, glucose 108, total protein 6 otherwise unremarkable.  Personally reviewed images CT of the head and agree with the following:  IMPRESSION: CT head 02/26/2016 (in ED for a fall): Atrophy with supratentorial small vessel disease. No intracranial mass hemorrhage, or extra-axial fluid collection. No acute infarct evident. Previous surgical removal a portion of the lateral right occipital bone. Areas of arterial vascular calcification noted.  Primary diagnosis patient is suffering from trigeminal neuralgia for several decades. He was operated with microvascular decompression several times also treated with gamma knife without much effect. He recently stopped oxycodone. He was recently treated with DREZ by Dr. Mearl Latin and is complaining about postprocedural resurgence of neuralgic pain in V2 and also retro-orbital pain always on the right side. Diagnosed with postprocedural flare up. They added  low-dose naltrexone increased gabapentin and also added Vimpat. Repeating his neuro imaging was suggested.   Review of Systems: Patient complains of symptoms per HPI  as well as the following symptoms: No CP, no SOB. Pertinent negatives per HPI. All others negative.  Social History   Social History  . Marital status: Married    Spouse name: N/A  . Number of children: 1  . Years of education: 65   Occupational History  . Retired    Social History Main Topics  . Smoking status: Former Smoker    Quit date: 06/14/1967  . Smokeless tobacco: Never Used  . Alcohol use 0.6 oz/week    1 Standard drinks or equivalent per week     Comment: rare  . Drug use: No  . Sexual activity: Not on file   Other Topics Concern  . Not on file   Social History Narrative   Lives at home w/ his wife   Right-handed   Caffeine: 2 cups per day    Family History  Problem Relation Age of Onset  . Colon cancer Mother   . Heart disease Mother   . Alcohol abuse Father     Past Medical History:  Diagnosis Date  . Colon polyp   . Coronary artery disease   . Deafness in right ear   . Esophageal stricture   . GERD (gastroesophageal reflux disease)    past history no recent problems  . Hyperchloremia   . Hypertension   . Myocardial infarction ([place])    hx. silent MI - no intervention except oral meds 7 yrs ago  . Rt facial numbness   . 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 Trigeminal neuralgia     Past Surgical History:  Procedure Laterality Date  . CYSTOSCOPY    . ESOPHAGOGASTRODUODENOSCOPY    . ESOPHAGOGASTRODUODENOSCOPY  06/14/2011   Procedure: ESOPHAGOGASTRODUODENOSCOPY (EGD);  Surgeon: Winfield Cunas., MD;  Location: [facility];  Service: Endoscopy;  Laterality: N/A;  . ESOPHAGOGASTRODUODENOSCOPY (EGD) WITH PROPOFOL N/A 08/12/2014   Procedure: ESOPHAGOGASTRODUODENOSCOPY (EGD) WITH PROPOFOL;  Surgeon: Laurence Spates, MD;  Location: [facility];  Service: Endoscopy;  Laterality: N/A;  . gamma knife     3 yrs ago [place] area around trigeminal nerve area"  . PROSTATE BIOPSY    . SAVORY DILATION  06/14/2011   Procedure: SAVORY DILATION;  Surgeon:  Winfield Cunas., MD;  Location: [facility];  Service: Endoscopy;  Laterality: N/A;  need xray  . SAVORY DILATION N/A 08/12/2014   Procedure: SAVORY DILATION;  Surgeon: Laurence Spates, MD;  Location: [facility];  Service: Endoscopy;  Laterality: N/A;  . TONSILLECTOMY      child  . TRIGEMINAL NERVE BALLOON DECOMPRESSION Right    right ear deafness    Current Outpatient Prescriptions  Medication Sig Dispense Refill  . acetaminophen (TYLENOL) 500 MG tablet Take 1,000 mg by mouth every 6 (six) hours as needed for mild pain.    Marland Kitchen aspirin 81 MG tablet Take 81 mg by mouth daily.    . finasteride (PROSCAR) 5 MG tablet Take 5 mg by mouth daily.    Marland Kitchen gabapentin (NEURONTIN) 300 MG capsule Take 2 capsules (600 mg total) by mouth 3 (three) times daily. 180 capsule 11  . Glucosamine HCl 1500 MG TABS Take 1 tablet by mouth daily.     Marland Kitchen lisinopril (PRINIVIL,ZESTRIL) 10 MG tablet Take 2 tablets (20 mg total) by mouth daily. 180 tablet 3  . metoprolol succinate (TOPROL-XL) 50 MG 24  hr tablet Take 1 tablet (50 mg total) by mouth daily. 90 tablet 1  . Multiple Vitamin (MULTIVITAMIN WITH MINERALS) TABS tablet Take 1 tablet by mouth daily.    . nitroGLYCERIN (NITROSTAT) 0.4 MG SL tablet Place 1 tablet (0.4 mg total) under the tongue every 5 (five) minutes as needed for chest pain. 25 tablet 6  . Omega-3 Fatty Acids (FISH OIL) 1200 MG CAPS Take 2,400 mg by mouth 2 (two) times daily.    . pravastatin (PRAVACHOL) 80 MG tablet Take 1 tablet (80 mg total) by mouth daily. 90 tablet 3   No current facility-administered medications for this visit.     Allergies as of 09/24/2016  . (No Known Allergies)    Vitals: BP 125/83 (BP Location: Right Arm, Patient Position: Sitting, Cuff Size: Normal)   Pulse 73   Wt 235 lb 6.4 oz (106.8 kg)   BMI 31.49 kg/m  Last Weight:  Wt Readings from Last 1 Encounters:  09/24/16 235 lb 6.4 oz (106.8 kg)   Last Height:   Ht Readings from Last 1 Encounters:  03/29/16 6'  0.5" (1.842 m)    Assessment/Plan: [age] year old male here with 25+ years of trigeminal neuralgia and 4 surgeries, migraines,  failed multiple medications. Will try botulinum toxin, there are many case series showing significant improvement after treatment for trigeminal neuralgia and for migraines. Gave patient and wife literature from [employer], he is quite knowledgeable and I think he will be able to read this medical literature.  Botox injections were not for patient. 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 Patient is refractory. He has tried multiple medications. He has not tried Lamictal, discussed serious side effects such as rash, we'll try this.  Cc: Dr. Simmie Davies, MD  [facility] [address] [place], [place] [address]  Phone [phone] Fax [phone]  A total of 15 minutes was spent face-to-face with this patient. Over half this time was spent on counseling patient on the trigeminal neuralgia, occipital neuropathy diagnosis and different diagnostic and therapeutic options available.

## 2016-10-01 ENCOUNTER — Ambulatory Visit (INDEPENDENT_AMBULATORY_CARE_PROVIDER_SITE_OTHER): Payer: Medicare Other | Admitting: Interventional Cardiology

## 2016-10-01 ENCOUNTER — Encounter: Payer: Self-pay | Admitting: Interventional Cardiology

## 2016-10-01 VITALS — BP 144/90 | HR 73 | Ht 73.0 in | Wt 235.4 lb

## 2016-10-01 DIAGNOSIS — I10 Essential (primary) hypertension: Secondary | ICD-10-CM | POA: Diagnosis not present

## 2016-10-01 DIAGNOSIS — I25119 Atherosclerotic heart disease of native coronary artery with unspecified angina pectoris: Secondary | ICD-10-CM

## 2016-10-01 DIAGNOSIS — E782 Mixed hyperlipidemia: Secondary | ICD-10-CM

## 2016-10-01 DIAGNOSIS — E669 Obesity, unspecified: Secondary | ICD-10-CM | POA: Diagnosis not present

## 2016-10-01 NOTE — Patient Instructions (Signed)

## 2016-10-01 NOTE — Progress Notes (Signed)
Cardiology Office Note   Date:  10/01/2016   ID:  Billy Knox, DOB [birth year], MRN [id number]  PCP:  Hulan Fess, MD    No chief complaint on file. CAD   Wt Readings from Last 3 Encounters:  10/01/16 235 lb 6.4 oz (106.8 kg)  09/24/16 235 lb 6.4 oz (106.8 kg)  03/29/16 225 lb (102.1 kg)       History of Present Illness: Billy Knox is a 76 y.o. male  with CAD, CTO of RCA diagnosed many years ago, Left to right collaterals, medically managed. He has had gamma knife surgery for tic doloreux in 2013.  He has lost over 20 pounds in  2016.  He gained some of the weight back due to dietary indiscretion.  He had  An episode of chest pain in 12/16.  He had esophageal dilatation with relief of sx.  (Schatzki's ring).  In early 2018, he had a syncopal episode after neurosurgery in 11/17 (Nucleus Caudalis Dorsal root entry zone, for facial pain from trigeminal neuralgia.  Dr.  Leonia Corona.  He had a broken left eye socket. He had an extensive evaluation. He thinks he was overmedicated with his neurologic medications. Since reducing the dosages, he has not had any further syncope.  He felt "loopy" at that time.  THis has resolved.  Unfortunately, his pain is worse after the surgery.    Denies : Chest pain. Dizziness. Leg edema. Nitroglycerin use. Orthopnea. Palpitations. Paroxysmal nocturnal dyspnea. Shortness of breath. Syncope.   His walking has decreased to 2.5 miles/day.  It has take a while to get over his last surgery.  He has gained weight as well.  He is working on Comptroller.   He was down to 202 lbs a few years ago.   BP high today, but typically well controlled.  Around 756-433 systolic.  He has had some dietary indiscretions.    Past Medical History:  Diagnosis Date  . Colon polyp   . Coronary artery disease   . Deafness in right ear   . Esophageal stricture   . GERD (gastroesophageal reflux disease)    past history no recent problems  .  Hyperchloremia   . Hypertension   . Myocardial infarction ([facility])    hx. silent MI - no intervention except oral meds 7 yrs ago  . Rt facial numbness   . 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 Trigeminal neuralgia     Past Surgical History:  Procedure Laterality Date  . CYSTOSCOPY    . ESOPHAGOGASTRODUODENOSCOPY    . ESOPHAGOGASTRODUODENOSCOPY  06/14/2011   Procedure: ESOPHAGOGASTRODUODENOSCOPY (EGD);  Surgeon: Winfield Cunas., MD;  Location: [facility];  Service: Endoscopy;  Laterality: N/A;  . ESOPHAGOGASTRODUODENOSCOPY (EGD) WITH PROPOFOL N/A 08/12/2014   Procedure: ESOPHAGOGASTRODUODENOSCOPY (EGD) WITH PROPOFOL;  Surgeon: Laurence Spates, MD;  Location: [facility];  Service: Endoscopy;  Laterality: N/A;  . gamma knife     3 yrs ago [place] area around trigeminal nerve area"  . PROSTATE BIOPSY    . SAVORY DILATION  06/14/2011   Procedure: SAVORY DILATION;  Surgeon: Winfield Cunas., MD;  Location: [facility];  Service: Endoscopy;  Laterality: N/A;  need xray  . SAVORY DILATION N/A 08/12/2014   Procedure: SAVORY DILATION;  Surgeon: Laurence Spates, MD;  Location: [facility];  Service: Endoscopy;  Laterality: N/A;  . TONSILLECTOMY      child  . TRIGEMINAL NERVE BALLOON DECOMPRESSION Right    right ear deafness  Current Outpatient Prescriptions  Medication Sig Dispense Refill  . acetaminophen (TYLENOL) 500 MG tablet Take 1,000 mg by mouth every 6 (six) hours as needed for mild pain.    Marland Kitchen aspirin 81 MG tablet Take 81 mg by mouth daily.    . finasteride (PROSCAR) 5 MG tablet Take 5 mg by mouth daily.    Marland Kitchen gabapentin (NEURONTIN) 300 MG capsule Take 2 capsules (600 mg total) by mouth 3 (three) times daily. 180 capsule 11  . Glucosamine HCl 1500 MG TABS Take 1 tablet by mouth daily.     Marland Kitchen lamoTRIgine (LAMICTAL) 25 MG tablet Wk 1: 25mg  (one pill) at night, Wk 2: 25mg  (one pill)  Twice dailyt, Wk 3: 50mg  (two pills) every night and 25mg  every morning, Wk 4: 50mg  (two pills)  twice daily, Wk 5: 75mg  (three pills) every night and 50 mg (two pills) every morning, Wk 6: 75mg  (three pills) twice daily, Wk 7: 100mg  (four pills) every night and 75mg  (three pills) every morning, Wk 8: 100mg  (four pills) twice daily 240 tablet 3  . lisinopril (PRINIVIL,ZESTRIL) 10 MG tablet Take 2 tablets (20 mg total) by mouth daily. 180 tablet 3  . metoprolol succinate (TOPROL-XL) 50 MG 24 hr tablet Take 1 tablet (50 mg total) by mouth daily. 90 tablet 1  . Multiple Vitamin (MULTIVITAMIN WITH MINERALS) TABS tablet Take 1 tablet by mouth daily.    . nitroGLYCERIN (NITROSTAT) 0.4 MG SL tablet Place 1 tablet (0.4 mg total) under the tongue every 5 (five) minutes as needed for chest pain. 25 tablet 6  . Omega-3 Fatty Acids (FISH OIL) 1200 MG CAPS Take 2,400 mg by mouth 2 (two) times daily.    . pravastatin (PRAVACHOL) 80 MG tablet Take 1 tablet (80 mg total) by mouth daily. 90 tablet 3   No current facility-administered medications for this visit.     Allergies:   Patient has no known allergies.    Social History:  The patient  reports that he quit smoking about 49 years ago. He has never used smokeless tobacco. He reports that he drinks about 0.6 oz of alcohol per week . He reports that he does not use drugs.   Family History:  The patient's family history includes Alcohol abuse in his father; Colon cancer in his mother; Heart disease in his mother.    ROS:  Please see the history of present illness.   Otherwise, review of systems are positive for improving stamina.   All other systems are reviewed and negative.    PHYSICAL EXAM: VS:  BP (!) 144/90   Pulse 73   Ht 6\' 1"  (1.854 m)   Wt 235 lb 6.4 oz (106.8 kg)   SpO2 97%   BMI 31.06 kg/m  , BMI Body mass index is 31.06 kg/m. GEN: Well nourished, well developed, in no acute distress  HEENT: slight assymmetry of left eye to right eye Neck: no JVD, carotid bruits, or masses Cardiac: RRR; no murmurs, rubs, or gallops,no edema    Respiratory:  clear to auscultation bilaterally, normal work of breathing GI: soft, nontender, nondistended, + BS MS: no deformity or atrophy  Skin: warm and dry, no rash Neuro:  Strength and sensation are intact Psych: euthymic mood, full affect   EKG:   The ekg ordered in 2/18 demonstrates NSR, no ST segment changes   Recent Labs: 02/26/2016: ALT 33; BUN 9; Creatinine, Ser 0.92; Hemoglobin 14.6; Platelets 183; Potassium 4.1; Sodium 138   Lipid Panel  Component Value Date/Time   CHOL 131 09/08/2015 0734   TRIG 141 09/08/2015 0734   HDL 34 (L) 09/08/2015 0734   CHOLHDL 3.9 09/08/2015 0734   VLDL 28 09/08/2015 0734   LDLCALC 69 09/08/2015 0734     Other studies Reviewed: Additional studies/ records that were reviewed today with results demonstrating: 2/18 ECG.   ASSESSMENT AND PLAN:  1. CAD: Known chronic total occlusion of the RCA. Continue aggressive secondary prevention. He has not had any indication for attempted revascularization or repeat catheterization for several years.  Increasing stamina with regular exercise.  I encouraged him to continue. No angina on medical therapy. 2. Hyperlipidemia: Continue pravastatin and fish oil.  Labs from 2017 reviewed and well controlled.  TO be checked with Dr. Rex Kras.  3. Hypertension:  Controlled at home.  Continue current meds. 4. Obesity: Should improve with exercise.   5. No further syncope or falls.  Slow recovery from prior fall.     Current medicines are reviewed at length with the patient today.  The patient concerns regarding his medicines were addressed.  The following changes have been made:  No change  Labs/ tests ordered today include:  No orders of the defined types were placed in this encounter.   Recommend 150 minutes/week of aerobic exercise Low fat, low carb, high fiber diet recommended  Disposition:   FU in 1 year   Signed, Larae Grooms, MD  10/01/2016 9:54 AM    [facility]  [facility], [place], [place]  [address] Phone: [phone]; Fax: [phone]

## 2016-10-22 ENCOUNTER — Other Ambulatory Visit: Payer: Self-pay | Admitting: Gastroenterology

## 2016-10-22 ENCOUNTER — Encounter (HOSPITAL_COMMUNITY): Payer: Self-pay | Admitting: *Deleted

## 2016-10-25 DIAGNOSIS — J069 Acute upper respiratory infection, unspecified: Secondary | ICD-10-CM | POA: Diagnosis not present

## 2016-10-29 ENCOUNTER — Ambulatory Visit (HOSPITAL_COMMUNITY)
Admission: RE | Admit: 2016-10-29 | Discharge: 2016-10-29 | Disposition: A | Discharge status: Home / Self Care | Payer: Medicare Other | Source: Ambulatory Visit | Attending: Gastroenterology | Admitting: Gastroenterology

## 2016-10-29 ENCOUNTER — Ambulatory Visit (HOSPITAL_COMMUNITY): Payer: Medicare Other

## 2016-10-29 ENCOUNTER — Encounter (HOSPITAL_COMMUNITY)
Admission: RE | Disposition: A | Discharge status: Home / Self Care | Payer: Self-pay | Source: Ambulatory Visit | Attending: Gastroenterology

## 2016-10-29 ENCOUNTER — Encounter (HOSPITAL_COMMUNITY): Payer: Self-pay | Admitting: *Deleted

## 2016-10-29 ENCOUNTER — Ambulatory Visit (HOSPITAL_COMMUNITY): Payer: Medicare Other | Admitting: Anesthesiology

## 2016-10-29 DIAGNOSIS — Z87891 Personal history of nicotine dependence: Secondary | ICD-10-CM | POA: Diagnosis not present

## 2016-10-29 DIAGNOSIS — Z79899 Other long term (current) drug therapy: Secondary | ICD-10-CM | POA: Insufficient documentation

## 2016-10-29 DIAGNOSIS — R1310 Dysphagia, unspecified: Secondary | ICD-10-CM

## 2016-10-29 DIAGNOSIS — K219 Gastro-esophageal reflux disease without esophagitis: Secondary | ICD-10-CM | POA: Diagnosis not present

## 2016-10-29 DIAGNOSIS — K222 Esophageal obstruction: Secondary | ICD-10-CM | POA: Insufficient documentation

## 2016-10-29 DIAGNOSIS — I10 Essential (primary) hypertension: Secondary | ICD-10-CM | POA: Insufficient documentation

## 2016-10-29 DIAGNOSIS — I2510 Atherosclerotic heart disease of native coronary artery without angina pectoris: Secondary | ICD-10-CM | POA: Insufficient documentation

## 2016-10-29 HISTORY — DX: Malignant (primary) neoplasm, unspecified: C80.1

## 2016-10-29 HISTORY — PX: SAVORY DILATION: SHX5439

## 2016-10-29 HISTORY — PX: ESOPHAGOGASTRODUODENOSCOPY (EGD) WITH PROPOFOL: SHX5813

## 2016-10-29 SURGERY — ESOPHAGOGASTRODUODENOSCOPY (EGD) WITH PROPOFOL
Anesthesia: Monitor Anesthesia Care

## 2016-10-29 MED ORDER — LACTATED RINGERS IV SOLN
INTRAVENOUS | Status: DC
  Administered 2016-10-29: 13:00:00 via INTRAVENOUS

## 2016-10-29 MED ORDER — SODIUM CHLORIDE 0.9 % IV SOLN: INTRAVENOUS | Status: DC

## 2016-10-29 MED ORDER — LIDOCAINE 2% (20 MG/ML) 5 ML SYRINGE
INTRAMUSCULAR | Status: AC
  Filled 2016-10-29: qty 5

## 2016-10-29 MED ORDER — LIDOCAINE 2% (20 MG/ML) 5 ML SYRINGE
INTRAMUSCULAR | Status: DC | PRN
  Administered 2016-10-29: 100 mg via INTRAVENOUS

## 2016-10-29 MED ORDER — PHENYLEPHRINE 40 MCG/ML (10ML) SYRINGE FOR IV PUSH (FOR BLOOD PRESSURE SUPPORT)
PREFILLED_SYRINGE | INTRAVENOUS | Status: AC
  Filled 2016-10-29: qty 10

## 2016-10-29 MED ORDER — PROPOFOL 10 MG/ML IV BOLUS
INTRAVENOUS | Status: DC | PRN
  Administered 2016-10-29 (×3): 20 mg via INTRAVENOUS

## 2016-10-29 MED ORDER — FENTANYL CITRATE (PF) 100 MCG/2ML IJ SOLN
INTRAMUSCULAR | Status: AC
  Filled 2016-10-29: qty 2

## 2016-10-29 MED ORDER — PROPOFOL 500 MG/50ML IV EMUL
INTRAVENOUS | Status: DC | PRN
  Administered 2016-10-29: 125 ug/kg/min via INTRAVENOUS

## 2016-10-29 MED ORDER — PROPOFOL 10 MG/ML IV BOLUS
INTRAVENOUS | Status: AC
  Filled 2016-10-29: qty 40

## 2016-10-29 MED ORDER — PHENYLEPHRINE 40 MCG/ML (10ML) SYRINGE FOR IV PUSH (FOR BLOOD PRESSURE SUPPORT)
PREFILLED_SYRINGE | INTRAVENOUS | Status: DC | PRN
  Administered 2016-10-29: 80 ug via INTRAVENOUS

## 2016-10-29 SURGICAL SUPPLY — 15 items

## 2016-10-29 NOTE — Transfer of Care (Signed)
Immediate Anesthesia Transfer of Care Note  Patient: Billy Knox  Procedure(s) Performed: ESOPHAGOGASTRODUODENOSCOPY (EGD) WITH PROPOFOL (N/A ) SAVORY DILATION (N/A )  Patient Location: Endoscopy Unit  Anesthesia Type:MAC  Level of Consciousness: awake, alert  and oriented  Airway & Oxygen Therapy: Patient Spontanous Breathing and Patient connected to nasal cannula oxygen  Post-op Assessment: Report given to RN and Post -op Vital signs reviewed and stable  Post vital signs: Reviewed and stable  Last Vitals:  Vitals:   10/29/16 1300  BP: (!) 170/90  Pulse: 71  Resp: 17  Temp: 36.8 C  SpO2: 98%    Last Pain:  Vitals:   10/29/16 1300  TempSrc: Oral         Complications: No apparent anesthesia complications

## 2016-10-29 NOTE — Anesthesia Preprocedure Evaluation (Signed)
Anesthesia Evaluation  Patient identified by MRN, date of birth, ID band Patient awake    Reviewed: Allergy & Precautions, NPO status , Patient's Chart, lab work & pertinent test results  Airway Mallampati: I  TM Distance: >3 FB Neck ROM: Full    Dental   Pulmonary former smoker,    Pulmonary exam normal        Cardiovascular hypertension, Pt. on medications + CAD and + Past MI  Normal cardiovascular exam     Neuro/Psych    GI/Hepatic GERD  Medicated and Controlled,  Endo/Other    Renal/GU      Musculoskeletal   Abdominal   Peds  Hematology   Anesthesia Other Findings   Reproductive/Obstetrics                             Anesthesia Physical Anesthesia Plan  ASA: III  Anesthesia Plan: MAC   Post-op Pain Management:    Induction: Intravenous  PONV Risk Score and Plan: 2 and Ondansetron and Dexamethasone  Airway Management Planned: Simple Face Mask  Additional Equipment:   Intra-op Plan:   Post-operative Plan: Extubation in OR  Informed Consent: I have reviewed the patients History and Physical, chart, labs and discussed the procedure including the risks, benefits and alternatives for the proposed anesthesia with the patient or authorized representative who has indicated his/her understanding and acceptance.     Plan Discussed with: CRNA and Surgeon  Anesthesia Plan Comments:         Anesthesia Quick Evaluation

## 2016-10-29 NOTE — Discharge Instructions (Signed)
Continue current meds. Clear liquids for 4-6 hours, if no chest pain or trouble breathing, soft foods tonight.  Regular diet tomorrow. Call for problems.  We will call to set up OP xray study

## 2016-10-29 NOTE — Anesthesia Procedure Notes (Signed)
Date/Time: 10/29/2016 2:42 PM Performed by: Danley Danker L Oxygen Delivery Method: Nasal cannula

## 2016-10-29 NOTE — Consult Note (Addendum)
Subjective:   Patient is a 76 y.o. male presents with dysphagia he has had a history of esophageal stricture from reflux 2016 he was dilated to 15 mm and did well until the past 3 months. He has brought back for repeat EGD and dilatation.. Procedure including risks and benefits discussed in office.  Patient Active Problem List   Diagnosis Date Noted  . Chronic migraine without aura, not intractable, without status migrainosus 03/31/2016  . Fall 03/07/2016  . Mixed hyperlipidemia 03/24/2013  . Coronary artery disease   . Hypertension   . Hyperchloremia   . Esophageal stricture   . Colon polyp   . 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 Trigeminal neuralgia   . Deafness in right ear   . Rt facial numbness    Past Medical History:  Diagnosis Date  . 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 Colon polyp   . Coronary artery disease   . Deafness in right ear   . Esophageal stricture   . GERD (gastroesophageal reflux disease)    past history no recent problems  . Hyperchloremia   . Hypertension   . Myocardial infarction [facility]) 2007   hx. silent MI - no intervention except oral meds   . Rt facial numbness   . 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 Trigeminal neuralgia     Past Surgical History:  Procedure Laterality Date  . CYSTOSCOPY    . ESOPHAGOGASTRODUODENOSCOPY    . ESOPHAGOGASTRODUODENOSCOPY  06/14/2011   Procedure: ESOPHAGOGASTRODUODENOSCOPY (EGD);  Surgeon: Winfield Cunas., MD;  Location: [facility];  Service: Endoscopy;  Laterality: N/A;  . ESOPHAGOGASTRODUODENOSCOPY (EGD) WITH PROPOFOL N/A 08/12/2014   Procedure: ESOPHAGOGASTRODUODENOSCOPY (EGD) WITH PROPOFOL;  Surgeon: Laurence Spates, MD;  Location: [facility];  Service: Endoscopy;  Laterality: N/A;  . gamma knife     3 yrs ago [place] area around trigeminal nerve area"  . PROSTATE BIOPSY     x 3  . SAVORY DILATION  06/14/2011   Procedure: SAVORY DILATION;  Surgeon: Winfield Cunas., MD;  Location: [facility];  Service: Endoscopy;  Laterality: N/A;  need xray  . SAVORY DILATION N/A 08/12/2014   Procedure: SAVORY DILATION;  Surgeon: Laurence Spates, MD;  Location: [facility];  Service: Endoscopy;  Laterality: N/A;  . TONSILLECTOMY      child  . TRIGEMINAL NERVE BALLOON DECOMPRESSION Right    right ear deafness x 4 total    Prescriptions Prior to Admission  Medication Sig Dispense Refill Last Dose  . acetaminophen (TYLENOL) 500 MG tablet Take 1,000 mg by mouth every 6 (six) hours as needed for mild pain.   10/28/2016 at Unknown time  . aspirin 81 MG tablet Take 81 mg by mouth daily.   10/28/2016 at Unknown time  . finasteride (PROSCAR) 5 MG tablet Take 5 mg by mouth daily.   10/29/2016 at Unknown time  . gabapentin (NEURONTIN) 300 MG capsule Take 2 capsules (600 mg total) by mouth 3 (three) times daily. (Patient taking differently: Take 300 mg by mouth 4 (four) times daily. ) 180 capsule 11 10/29/2016 at Unknown time  . Glucosamine HCl 1500 MG TABS Take 1 tablet by mouth daily.    Past Week at Unknown time  . lamoTRIgine (LAMICTAL) 25 MG tablet Wk 1: 25mg  (one pill) at night, Wk 2: 25mg  (one pill)  Twice dailyt, Wk 3: 50mg  (two pills) every  night and 25mg  every morning, Wk 4: 50mg  (two pills) twice daily, Wk 5: 75mg  (three pills) every night and 50 mg (two pills) every morning, Wk 6: 75mg  (three pills) twice daily, Wk 7: 100mg  (four pills) every night and 75mg  (three pills) every morning, Wk 8: 100mg  (four pills) twice daily (Patient taking differently: Take 100 mg by mouth 2 (two) times daily. ) 240 tablet 3 10/29/2016 at Unknown time  . lisinopril (PRINIVIL,ZESTRIL) 10 MG tablet Take 2 tablets (20 mg total) by mouth daily. (Patient taking differently: Take 10 mg by mouth 2 (two) times daily. ) 180 tablet 3 10/28/2016 at Unknown time  . metoprolol succinate (TOPROL-XL) 50 MG 24 hr tablet Take 1 tablet (50 mg total) by  mouth daily. 90 tablet 1 10/29/2016 at Unknown time  . Multiple Vitamin (MULTIVITAMIN WITH MINERALS) TABS tablet Take 1 tablet by mouth daily.   10/28/2016 at Unknown time  . Omega-3 Fatty Acids (FISH OIL) 1200 MG CAPS Take 2,400 mg by mouth 2 (two) times daily.   10/28/2016 at Unknown time  . pravastatin (PRAVACHOL) 80 MG tablet Take 1 tablet (80 mg total) by mouth daily. (Patient taking differently: Take 80 mg by mouth at bedtime. ) 90 tablet 3 10/28/2016 at Unknown time  . nitroGLYCERIN (NITROSTAT) 0.4 MG SL tablet Place 1 tablet (0.4 mg total) under the tongue every 5 (five) minutes as needed for chest pain. 25 tablet 6 Unknown at Unknown time   No Known Allergies  Social History  Substance Use Topics  . Smoking status: Former Smoker    Quit date: 06/14/1967  . Smokeless tobacco: Never Used  . Alcohol use No    Family History  Problem Relation Age of Onset  . Colon cancer Mother   . Heart disease Mother   . Alcohol abuse Father      Objective:   Patient Vitals for the past 8 hrs:  BP Temp Temp src Pulse Resp SpO2 Height Weight  10/29/16 1300 (!) 170/90 98.3 F (36.8 C) Oral 71 17 98 % 6\' 1"  (1.854 m) 106.6 kg (235 lb)   No intake/output data recorded. No intake/output data recorded.   See MD Preop evaluation      Assessment:   1. Esophageal stricture  Plan:   Proceed with EGD and esophageal dilatation. Have discussed with the patient again the risk and benefits the procedure including bleeding and perforation.  This note is a preprocedure  OP H+P

## 2016-10-29 NOTE — Op Note (Signed)
[facility] Patient Name: Billy Knox Procedure Date: 10/29/2016 MRN: [id number] Attending MD: Nancy Fetter Dr., MD Date of Birth: 1940-08-23 CSN: [id number] Age: 76 Admit Type: Outpatient Procedure:                Upper GI endoscopy Indications:              Dysphagia, with history of dilatation to 15 mm in                            the past. He has had increasing problems with                            dysphagia over the past several months. Providers:                Joyice Faster. Yuliet Needs Dr., MD, Burtis Junes, RN, Tinnie Gens, Technician Referring MD:             Hulan Fess MD Medicines:                Monitored Anesthesia Care Complications:            No immediate complications. Estimated Blood Loss:     Estimated blood loss: none. Procedure:                Pre-Anesthesia Assessment:                           - Prior to the procedure, a History and Physical                            was performed, and patient medications and                            allergies were reviewed. The patient's tolerance of                            previous anesthesia was also reviewed. The risks                            and benefits of the procedure and the sedation                            options and risks were discussed with the patient.                            All questions were answered, and informed consent                            was obtained. Prior Anticoagulants: The patient has                            taken no previous anticoagulant or antiplatelet  agents. ASA Grade Assessment: III - A patient with                            severe systemic disease. After reviewing the risks                            and benefits, the patient was deemed in                            satisfactory condition to undergo the procedure.                           After obtaining informed consent, the endoscope was                  passed under direct vision. Throughout the                            procedure, the patient's blood pressure, pulse, and                            oxygen saturations were monitored continuously. The                            [id number]) scope was introduced through the                            mouth, and advanced to the second part of duodenum.                            The upper GI endoscopy was accomplished without                            difficulty. The patient tolerated the procedure                            well. Findings:      The lumen of the esophagus was moderately dilated.      Tongues of salmon-colored mucosa were present.      One mild benign-appearing, intrinsic stenosis was found at the       gastroesophageal junction. And was traversed. A guidewire was placed       under fluoroscopic guidance and the scope was withdrawn. Dilation was       performed with a Savary dilator with no resistance at 14 mm and 15 mm.      The stomach was normal.      The examined duodenum was normal. Impression:               - Dilation in the entire esophagus.                           - Salmon-colored mucosa suggestive of Barrett's                            esophagus.                           -  Benign-appearing esophageal stenosis. Dilated.                            This is fairly minimal and with the dilated                            appearing esophagus I wonder if he has some type of                            motility disorders                           - Normal stomach.                           - Normal examined duodenum.                           - No specimens collected. Moderate Sedation:      MAC by anesthesia Recommendation:           - Patient has a contact number available for                            emergencies. The signs and symptoms of potential                            delayed complications were discussed with the                             patient. Return to normal activities tomorrow.                            Written discharge instructions were provided to the                            patient.                           - Clear liquid diet for 6 hours.                           - Continue present medications.                           - Return to endoscopist in 2 weeks.                           - Perform a modified barium swallow at appointment                            to be scheduled. Procedure Code(s):        --- Professional ---                           [phone], Esophagogastroduodenoscopy, flexible,  transoral; with insertion of guide wire followed by                            passage of dilator(s) through esophagus over guide                            wire                           [id number], Intraluminal dilation of strictures and/or                            obstructions (eg, esophagus), radiological                            supervision and interpretation Diagnosis Code(s):        --- Professional ---                           K22.2, Esophageal obstruction                           K22.8, Other specified diseases of esophagus                           R13.10, Dysphagia, unspecified CPT copyright 2016 American Medical Association. All rights reserved. The codes documented in this report are preliminary and upon coder review may  be revised to meet current compliance requirements. Nancy Fetter Dr., MD 10/29/2016 2:46:54 PM This report has been signed electronically. Number of Addenda: 0

## 2016-10-29 NOTE — Anesthesia Postprocedure Evaluation (Signed)
Anesthesia Post Note  Patient: Billy Knox  Procedure(s) Performed: ESOPHAGOGASTRODUODENOSCOPY (EGD) WITH PROPOFOL (N/A ) SAVORY DILATION (N/A )     Patient location during evaluation: PACU Anesthesia Type: MAC Level of consciousness: awake and alert Pain management: pain level controlled Vital Signs Assessment: post-procedure vital signs reviewed and stable Respiratory status: spontaneous breathing, nonlabored ventilation, respiratory function stable and patient connected to nasal cannula oxygen Cardiovascular status: stable and blood pressure returned to baseline Postop Assessment: no apparent nausea or vomiting Anesthetic complications: no    Last Vitals:  Vitals:   10/29/16 1444 10/29/16 1450  BP: 137/72 129/87  Pulse: 71 69  Resp: 16 15  Temp: (!) 36.4 C   SpO2: 98% 99%    Last Pain:  Vitals:   10/29/16 1444  TempSrc: Oral                 [name] DAVID

## 2016-10-30 ENCOUNTER — Other Ambulatory Visit: Payer: Self-pay | Admitting: Gastroenterology

## 2016-10-30 DIAGNOSIS — R1310 Dysphagia, unspecified: Secondary | ICD-10-CM

## 2016-10-31 ENCOUNTER — Encounter (HOSPITAL_COMMUNITY): Payer: Self-pay | Admitting: Gastroenterology

## 2016-11-05 ENCOUNTER — Other Ambulatory Visit: Payer: Medicare Other

## 2016-11-05 DIAGNOSIS — Z961 Presence of intraocular lens: Secondary | ICD-10-CM | POA: Diagnosis not present

## 2016-11-05 DIAGNOSIS — H40013 Open angle with borderline findings, low risk, bilateral: Secondary | ICD-10-CM | POA: Diagnosis not present

## 2016-11-05 DIAGNOSIS — H26493 Other secondary cataract, bilateral: Secondary | ICD-10-CM | POA: Diagnosis not present

## 2016-11-05 DIAGNOSIS — H353133 Nonexudative age-related macular degeneration, bilateral, advanced atrophic without subfoveal involvement: Secondary | ICD-10-CM | POA: Diagnosis not present

## 2016-11-06 ENCOUNTER — Ambulatory Visit
Admission: RE | Admit: 2016-11-06 | Discharge: 2016-11-06 | Disposition: A | Discharge status: Home / Self Care | Payer: Medicare Other | Source: Ambulatory Visit | Attending: Gastroenterology | Admitting: Gastroenterology

## 2016-11-06 DIAGNOSIS — R1310 Dysphagia, unspecified: Secondary | ICD-10-CM

## 2016-11-06 DIAGNOSIS — R972 Elevated prostate specific antigen [PSA]: Secondary | ICD-10-CM | POA: Diagnosis not present

## 2016-11-06 DIAGNOSIS — K224 Dyskinesia of esophagus: Secondary | ICD-10-CM | POA: Diagnosis not present

## 2016-11-12 ENCOUNTER — Encounter: Payer: Self-pay | Admitting: Neurology

## 2016-11-12 ENCOUNTER — Other Ambulatory Visit: Payer: Self-pay | Admitting: Neurology

## 2016-11-12 DIAGNOSIS — G500 Trigeminal neuralgia: Secondary | ICD-10-CM

## 2016-11-12 MED ORDER — LAMOTRIGINE 100 MG PO TABS: 100.0000 mg | ORAL_TABLET | Freq: Two times a day (BID) | ORAL | 4 refills | Status: DC

## 2016-11-13 ENCOUNTER — Encounter: Payer: Self-pay | Admitting: Neurology

## 2016-11-13 DIAGNOSIS — N400 Enlarged prostate without lower urinary tract symptoms: Secondary | ICD-10-CM | POA: Diagnosis not present

## 2016-11-13 DIAGNOSIS — R972 Elevated prostate specific antigen [PSA]: Secondary | ICD-10-CM | POA: Diagnosis not present

## 2016-11-14 ENCOUNTER — Telehealth: Payer: Self-pay | Admitting: *Deleted

## 2016-11-14 NOTE — Telephone Encounter (Signed)
Faxed Lamictal Rx to [facility] pharmacy. Received a receipt of confirmation.

## 2016-12-02 DIAGNOSIS — R1310 Dysphagia, unspecified: Secondary | ICD-10-CM | POA: Diagnosis not present

## 2016-12-02 DIAGNOSIS — Z86010 Personal history of colonic polyps: Secondary | ICD-10-CM | POA: Diagnosis not present

## 2016-12-02 DIAGNOSIS — K222 Esophageal obstruction: Secondary | ICD-10-CM | POA: Diagnosis not present

## 2016-12-02 DIAGNOSIS — G500 Trigeminal neuralgia: Secondary | ICD-10-CM | POA: Diagnosis not present

## 2016-12-11 DIAGNOSIS — I10 Essential (primary) hypertension: Secondary | ICD-10-CM | POA: Diagnosis not present

## 2016-12-12 DIAGNOSIS — K5730 Diverticulosis of large intestine without perforation or abscess without bleeding: Secondary | ICD-10-CM | POA: Diagnosis not present

## 2016-12-12 DIAGNOSIS — Z86010 Personal history of colonic polyps: Secondary | ICD-10-CM | POA: Diagnosis not present

## 2016-12-18 DIAGNOSIS — I10 Essential (primary) hypertension: Secondary | ICD-10-CM | POA: Diagnosis not present

## 2016-12-18 DIAGNOSIS — Z0000 Encounter for general adult medical examination without abnormal findings: Secondary | ICD-10-CM | POA: Diagnosis not present

## 2016-12-18 DIAGNOSIS — E782 Mixed hyperlipidemia: Secondary | ICD-10-CM | POA: Diagnosis not present

## 2016-12-18 DIAGNOSIS — Z23 Encounter for immunization: Secondary | ICD-10-CM | POA: Diagnosis not present

## 2016-12-19 ENCOUNTER — Telehealth: Payer: Self-pay | Admitting: Interventional Cardiology

## 2016-12-19 DIAGNOSIS — E782 Mixed hyperlipidemia: Secondary | ICD-10-CM

## 2016-12-19 MED ORDER — ATORVASTATIN CALCIUM 40 MG PO TABS: 40.0000 mg | ORAL_TABLET | Freq: Every day | ORAL | 3 refills | Status: DC

## 2016-12-19 NOTE — Telephone Encounter (Signed)
New message   Pt verbalized that his PCP Hulan Fess at [facility] want pt to speak with provider about changing medications

## 2016-12-19 NOTE — Telephone Encounter (Signed)
Patient calling and states that Dr. Rex Kras checked his cholesterol and it was slightly elevated. He states that Dr. Rex Kras is going to fax over the results and see if he wants him to switch from pravastatin to atorvastatin. Made patient aware that  We will review the labs once we receive them and call him back with recommendations. Patient verbalized understanding.

## 2016-12-19 NOTE — Telephone Encounter (Signed)
Patient made aware that Dr. Irish Lack reviewed his labs that were drawn by Dr. Rex Kras and that the patient can stop pravastatin and start atorvastatin 40 mg QD. Made patient aware that we will recheck labs in 3 months. Patient verbalizes understanding and thanked me for the call. Labs ordered and appointment made on 03/19/17. Rx sent to preferred pharmacy.

## 2017-01-01 ENCOUNTER — Other Ambulatory Visit: Payer: Self-pay | Admitting: *Deleted

## 2017-01-01 MED ORDER — LISINOPRIL 20 MG PO TABS: 20.0000 mg | ORAL_TABLET | Freq: Every day | ORAL | 0 refills | Status: DC

## 2017-01-01 MED ORDER — LISINOPRIL 20 MG PO TABS: 20.0000 mg | ORAL_TABLET | Freq: Every day | ORAL | 2 refills | Status: DC

## 2017-01-15 ENCOUNTER — Telehealth: Payer: Self-pay | Admitting: Neurology

## 2017-01-15 DIAGNOSIS — G500 Trigeminal neuralgia: Secondary | ICD-10-CM

## 2017-01-15 MED ORDER — LAMOTRIGINE 100 MG PO TABS: 100.0000 mg | ORAL_TABLET | Freq: Two times a day (BID) | ORAL | 4 refills | Status: DC

## 2017-01-15 NOTE — Telephone Encounter (Signed)
Patient calling to get a new Rx for lamoTRIgine (LAMICTAL) 100 MG tablet called to [facility]. Previously he has gotten from [facility].

## 2017-01-15 NOTE — Telephone Encounter (Signed)
Pharmacy changed to [facility] on prescription for Lamotrigine 100 mg tabs. Printed & ready for MD signature.

## 2017-01-15 NOTE — Telephone Encounter (Signed)
Faxed signed Lamotrigine prescription to [facility]. Received a receipt of confirmation.

## 2017-01-15 NOTE — Addendum Note (Signed)
Addended by: Gildardo Griffes on: 01/15/2017 03:27 PM   Modules accepted: Orders

## 2017-01-24 DIAGNOSIS — H43812 Vitreous degeneration, left eye: Secondary | ICD-10-CM | POA: Diagnosis not present

## 2017-01-24 DIAGNOSIS — H43392 Other vitreous opacities, left eye: Secondary | ICD-10-CM | POA: Diagnosis not present

## 2017-01-24 DIAGNOSIS — H353133 Nonexudative age-related macular degeneration, bilateral, advanced atrophic without subfoveal involvement: Secondary | ICD-10-CM | POA: Diagnosis not present

## 2017-03-11 ENCOUNTER — Other Ambulatory Visit: Payer: Self-pay | Admitting: *Deleted

## 2017-03-12 ENCOUNTER — Telehealth: Payer: Self-pay | Admitting: *Deleted

## 2017-03-12 MED ORDER — GABAPENTIN 300 MG PO CAPS: ORAL_CAPSULE | ORAL | 3 refills | Status: DC

## 2017-03-12 NOTE — Telephone Encounter (Signed)
Received refill request for Gabapentin from [facility] rather than [facility]. Spoke with patient to clarify his dosage, frequency of Gabapentin. He stated that his insurance no longer prefers [facility], but prefers [facility]. He is taking Gabapentin 300 mg every 5 hours while awake, about 4 times daily. He is doing well on this medication. He is also doing well on Lamotrigine. Patient will call office is he needs anything. He was very Patent attorney.   Gabapentin e-scribed to [employer] with instructions same as above which was discussed between pt & MD at last office visit.

## 2017-03-19 ENCOUNTER — Other Ambulatory Visit: Payer: Medicare Other | Admitting: *Deleted

## 2017-03-19 DIAGNOSIS — E782 Mixed hyperlipidemia: Secondary | ICD-10-CM

## 2017-03-19 LAB — LIPID PANEL
CHOLESTEROL TOTAL: 127 mg/dL (ref 100–199)
Chol/HDL Ratio: 3.7 ratio (ref 0.0–5.0)
HDL: 34 mg/dL — ABNORMAL LOW (ref >39)
LDL Calculated: 74 mg/dL (ref 0–99)
Triglycerides: 97 mg/dL (ref 0–149)
VLDL CHOLESTEROL CAL: 19 mg/dL (ref 5–40)

## 2017-03-19 LAB — HEPATIC FUNCTION PANEL
ALBUMIN: 4.4 g/dL (ref 3.5–4.8)
ALK PHOS: 67 IU/L (ref 39–117)
ALT: 20 IU/L (ref 0–44)
AST: 16 IU/L (ref 0–40)
BILIRUBIN, DIRECT: 0.14 mg/dL (ref 0.00–0.40)
Bilirubin Total: 0.4 mg/dL (ref 0.0–1.2)
Total Protein: 6.3 g/dL (ref 6.0–8.5)

## 2017-04-03 ENCOUNTER — Telehealth: Payer: Self-pay | Admitting: Interventional Cardiology

## 2017-04-03 NOTE — Telephone Encounter (Signed)
New message    Patient is asking for recent lab results to be sent over to Dr. Rex Kras with [facility] on [address].

## 2017-04-03 NOTE — Telephone Encounter (Signed)
Called and informed patient that his lab results from 03/19/17 have been faxed to Dr. Eddie Dibbles office. Patient very appreciative.

## 2017-04-25 DIAGNOSIS — E782 Mixed hyperlipidemia: Secondary | ICD-10-CM | POA: Diagnosis not present

## 2017-05-07 DIAGNOSIS — H43392 Other vitreous opacities, left eye: Secondary | ICD-10-CM | POA: Diagnosis not present

## 2017-05-07 DIAGNOSIS — H43812 Vitreous degeneration, left eye: Secondary | ICD-10-CM | POA: Diagnosis not present

## 2017-05-07 DIAGNOSIS — H40013 Open angle with borderline findings, low risk, bilateral: Secondary | ICD-10-CM | POA: Diagnosis not present

## 2017-05-07 DIAGNOSIS — H353133 Nonexudative age-related macular degeneration, bilateral, advanced atrophic without subfoveal involvement: Secondary | ICD-10-CM | POA: Diagnosis not present

## 2017-07-14 DIAGNOSIS — K222 Esophageal obstruction: Secondary | ICD-10-CM

## 2017-07-14 HISTORY — DX: Esophageal obstruction: K22.2

## 2017-07-27 ENCOUNTER — Emergency Department (HOSPITAL_COMMUNITY)
Admission: EM | Admit: 2017-07-27 | Discharge: 2017-07-28 | Disposition: A | Discharge status: Home / Self Care | Payer: Medicare Other | Attending: Gastroenterology | Admitting: Gastroenterology

## 2017-07-27 ENCOUNTER — Encounter (HOSPITAL_COMMUNITY): Payer: Self-pay

## 2017-07-27 ENCOUNTER — Other Ambulatory Visit: Payer: Self-pay

## 2017-07-27 DIAGNOSIS — I252 Old myocardial infarction: Secondary | ICD-10-CM | POA: Diagnosis not present

## 2017-07-27 DIAGNOSIS — I2510 Atherosclerotic heart disease of native coronary artery without angina pectoris: Secondary | ICD-10-CM | POA: Insufficient documentation

## 2017-07-27 DIAGNOSIS — T18128A Food in esophagus causing other injury, initial encounter: Secondary | ICD-10-CM | POA: Insufficient documentation

## 2017-07-27 DIAGNOSIS — Z85828 Personal history of other malignant neoplasm of skin: Secondary | ICD-10-CM | POA: Diagnosis not present

## 2017-07-27 DIAGNOSIS — Z79899 Other long term (current) drug therapy: Secondary | ICD-10-CM | POA: Insufficient documentation

## 2017-07-27 DIAGNOSIS — I10 Essential (primary) hypertension: Secondary | ICD-10-CM | POA: Diagnosis not present

## 2017-07-27 DIAGNOSIS — Z87891 Personal history of nicotine dependence: Secondary | ICD-10-CM | POA: Diagnosis not present

## 2017-07-27 DIAGNOSIS — Z7982 Long term (current) use of aspirin: Secondary | ICD-10-CM | POA: Diagnosis not present

## 2017-07-27 DIAGNOSIS — X58XXXA Exposure to other specified factors, initial encounter: Secondary | ICD-10-CM | POA: Diagnosis not present

## 2017-07-27 NOTE — ED Provider Notes (Signed)
[facility] DEPT Provider Note   CSN: [id number] Arrival date & time: 07/27/17  2143     History   Chief Complaint Chief Complaint  Patient presents with  . Swallowed Foreign Body    HPI Billy Knox is a 77 y.o. male.  HPI 77 year old male past medical history significant for hypertension, esophageal stricture, GERD presents to the ED for evaluation of food impaction.  States that 3 hours ago he was eating a shrimp taco and felt like he got a piece stuck in his throat.  History of same that has required anoscopy before.  Patient reports pain in his esophagus and drooling.  She denies shortness of breath.  Patient has not taken anything for the pain.  Not able to take anything by p.o.  Spoke with his GI doctor Dr. Oletta Lamas who has mobilized the Endo team to scope patient to remove bolus.  Patient denies shortness of breath, chest pain, fevers, chills, nausea, vomiting, abdominal pain.  Pt denies any fever, chill, ha, vision changes, lightheadedness, dizziness, congestion, neck pain, cp, sob, cough, abd pain, n/v/d, urinary symptoms, change in bowel habits, melena, hematochezia, lower extremity paresthesias.  Past Medical History:  Diagnosis Date  . 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 Colon polyp   . Coronary artery disease   . Deafness in right ear   . Esophageal stricture   . GERD (gastroesophageal reflux disease)    past history no recent problems  . Hyperchloremia   . Hypertension   . Myocardial infarction [facility]) 2007   hx. silent MI - no intervention except oral meds   . Rt facial numbness   . 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 Trigeminal neuralgia     Patient Active Problem List   Diagnosis Date Noted  . Chronic migraine without aura, not intractable, without status migrainosus 03/31/2016  . Fall 03/07/2016  . Mixed hyperlipidemia 03/24/2013  .  Coronary artery disease   . Hypertension   . Hyperchloremia   . Esophageal stricture   . Colon polyp   . 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 Trigeminal neuralgia   . Deafness in right ear   . Rt facial numbness     Past Surgical History:  Procedure Laterality Date  . CYSTOSCOPY    . ESOPHAGOGASTRODUODENOSCOPY    . ESOPHAGOGASTRODUODENOSCOPY  06/14/2011   Procedure: ESOPHAGOGASTRODUODENOSCOPY (EGD);  Surgeon: Winfield Cunas., MD;  Location: [facility];  Service: Endoscopy;  Laterality: N/A;  . ESOPHAGOGASTRODUODENOSCOPY (EGD) WITH PROPOFOL N/A 08/12/2014   Procedure: ESOPHAGOGASTRODUODENOSCOPY (EGD) WITH PROPOFOL;  Surgeon: Laurence Spates, MD;  Location: [facility];  Service: Endoscopy;  Laterality: N/A;  . ESOPHAGOGASTRODUODENOSCOPY (EGD) WITH PROPOFOL N/A 10/29/2016   Procedure: ESOPHAGOGASTRODUODENOSCOPY (EGD) WITH PROPOFOL;  Surgeon: Laurence Spates, MD;  Location: [facility];  Service: Endoscopy;  Laterality: N/A;  . gamma knife     3 yrs ago [place] area around trigeminal nerve area"  . PROSTATE BIOPSY     x 3  . SAVORY DILATION  06/14/2011   Procedure: SAVORY DILATION;  Surgeon: Winfield Cunas., MD;  Location: [facility];  Service: Endoscopy;  Laterality: N/A;  need xray  . SAVORY DILATION N/A 08/12/2014   Procedure: SAVORY DILATION;  Surgeon: Laurence Spates, MD;  Location: [facility];  Service: Endoscopy;  Laterality: N/A;  . SAVORY DILATION N/A 10/29/2016   Procedure: SAVORY DILATION;  Surgeon: Laurence Spates, MD;  Location: [facility];  Service: Endoscopy;  Laterality: N/A;  . TONSILLECTOMY      child  . TRIGEMINAL NERVE BALLOON DECOMPRESSION Right    right ear deafness x 4 total        Home Medications    Prior to Admission medications   Medication Sig Start Date End Date Taking? Authorizing Provider  acetaminophen (TYLENOL) 500 MG tablet Take 1,000 mg by mouth every 6 (six) hours as needed for mild pain.    [provider]  aspirin 81  MG tablet Take 81 mg by mouth daily.    [provider]  atorvastatin (LIPITOR) 40 MG tablet Take 1 tablet (40 mg total) by mouth daily. 12/19/16 03/19/17  Jettie Booze, MD  finasteride (PROSCAR) 5 MG tablet Take 5 mg by mouth daily.    [provider]  gabapentin (NEURONTIN) 300 MG capsule Take 1 capsule (300 mg) four times daily (every 5 hours). 03/12/17   [name], MD  Glucosamine HCl 1500 MG TABS Take 1 tablet by mouth daily.     [provider]  lamoTRIgine (LAMICTAL) 100 MG tablet Take 1 tablet (100 mg total) by mouth 2 (two) times daily. 01/15/17   [name], MD  lisinopril (PRINIVIL,ZESTRIL) 20 MG tablet Take 1 tablet (20 mg total) by mouth daily. 01/01/17 04/01/17  Jettie Booze, MD  metoprolol succinate (TOPROL-XL) 50 MG 24 hr tablet Take 1 tablet (50 mg total) by mouth daily. 08/13/16   Imogene Burn, PA-C  Multiple Vitamin (MULTIVITAMIN WITH MINERALS) TABS tablet Take 1 tablet by mouth daily.    [provider]  nitroGLYCERIN (NITROSTAT) 0.4 MG SL tablet Place 1 tablet (0.4 mg total) under the tongue every 5 (five) minutes as needed for chest pain. 10/02/15   Jettie Booze, MD  Omega-3 Fatty Acids (FISH OIL) 1200 MG CAPS Take 2,400 mg by mouth 2 (two) times daily.    [provider]    Family History Family History  Problem Relation Age of Onset  . Colon cancer Mother   . Heart disease Mother   . Alcohol abuse Father     Social History Social History   Tobacco Use  . Smoking status: Former Smoker    Last attempt to quit: 06/14/1967    Years since quitting: 50.1  . Smokeless tobacco: Never Used  Substance Use Topics  . Alcohol use: No    Alcohol/week: 0.6 oz    Types: 1 Standard drinks or equivalent per week  . Drug use: No     Allergies   Patient has no known allergies.   Review of Systems Review of Systems  All other systems reviewed and are negative.    Physical Exam Updated Vital  Signs BP (!) 142/81 (BP Location: Left Arm)   Pulse 66   Temp 98.1 F (36.7 C) (Oral)   Resp 18   Ht 6\' 3"  (1.905 m)   Wt 91.6 kg (202 lb)   SpO2 100%   BMI 25.25 kg/m   Physical Exam  Constitutional: He appears well-developed and well-nourished. No distress.  HENT:  Head: Normocephalic and atraumatic.  Speaking complete sentences.  Managing his airway.  Drooling noted.  Oropharynx is clear.  No angioedema.  Eyes: Right eye exhibits no discharge. Left eye exhibits no discharge. No scleral icterus.  Neck: Normal range of motion.  Cardiovascular: Normal rate, regular rhythm, normal heart sounds and intact distal pulses. Exam reveals no gallop  and no friction rub.  No murmur heard. Pulmonary/Chest: Effort normal and breath sounds normal. No stridor. No respiratory distress. He has no wheezes. He has no rales. He exhibits no tenderness.  Abdominal: Soft. Bowel sounds are normal. He exhibits no distension. There is no tenderness. There is no rebound and no guarding.  Musculoskeletal: Normal range of motion.  Neurological: He is alert.  Skin: Skin is warm and dry. Capillary refill takes less than 2 seconds. No pallor.  Psychiatric: His behavior is normal. Judgment and thought content normal.  Nursing note and vitals reviewed.    ED Treatments / Results  Labs (all labs ordered are listed, but only abnormal results are displayed) Labs Reviewed - No data to display  EKG None  Radiology No results found.  Procedures Procedures (including critical care time)  Medications Ordered in ED Medications - No data to display   Initial Impression / Assessment and Plan / ED Course  I have reviewed the triage vital signs and the nursing notes.  Pertinent labs & imaging results that were available during my care of the patient were reviewed by me and considered in my medical decision making (see chart for details).     Patient presents the ED for possible food impaction after eating  a shrimp taco.  Patient denies difficulty breathing.  He is drooling but speaking complete sentences and managing his airway at this time.  Patient states that he called his GI doctor Dr. Oletta Lamas who has mobilized the Endo team and will come into the hospital for endoscopy.  Patient is stable at this time.  Spoke to Dr. Oletta Lamas with GI who will take patient to perform scope.  Patient remains hemodynamic stable.  Awaiting transport to endoscopy suite.  Patient seen and evaluated with my attending who is agreed with the above plan.  Final Clinical Impressions(s) / ED Diagnoses   Final diagnoses:  Food impaction of esophagus, initial encounter    ED Discharge Orders    None       Aaron Edelman 07/27/17 2349    Carmin Muskrat, MD 07/28/17 0005

## 2017-07-27 NOTE — ED Notes (Signed)
Bed: RESB Expected date:  Expected time:  Means of arrival:  Comments: Raford Eble/food impaction

## 2017-07-27 NOTE — ED Triage Notes (Signed)
Pt coming from home c/o food that is that stuck in his throat that started 3 hours ago. Hx of impaction. Able to talk in full sentences but drooling

## 2017-07-28 ENCOUNTER — Encounter (HOSPITAL_COMMUNITY)
Admission: EM | Disposition: A | Discharge status: Home / Self Care | Payer: Self-pay | Source: Home / Self Care | Attending: Emergency Medicine

## 2017-07-28 ENCOUNTER — Encounter (HOSPITAL_COMMUNITY): Payer: Self-pay | Admitting: Gastroenterology

## 2017-07-28 DIAGNOSIS — T18128A Food in esophagus causing other injury, initial encounter: Secondary | ICD-10-CM | POA: Diagnosis not present

## 2017-07-28 HISTORY — PX: FOREIGN BODY REMOVAL: SHX962

## 2017-07-28 HISTORY — PX: ESOPHAGOGASTRODUODENOSCOPY: SHX5428

## 2017-07-28 SURGERY — EGD (ESOPHAGOGASTRODUODENOSCOPY)
Anesthesia: Moderate Sedation

## 2017-07-28 MED ORDER — BUTAMBEN-TETRACAINE-BENZOCAINE 2-2-14 % EX AERO
INHALATION_SPRAY | CUTANEOUS | Status: DC | PRN
  Administered 2017-07-28: 2 via TOPICAL

## 2017-07-28 MED ORDER — FENTANYL CITRATE (PF) 100 MCG/2ML IJ SOLN
INTRAMUSCULAR | Status: DC | PRN
  Administered 2017-07-28 (×2): 25 ug via INTRAVENOUS

## 2017-07-28 MED ORDER — MIDAZOLAM HCL 10 MG/2ML IJ SOLN
INTRAMUSCULAR | Status: DC | PRN
  Administered 2017-07-28 (×2): 2 mg via INTRAVENOUS

## 2017-07-28 MED ORDER — MIDAZOLAM HCL 5 MG/ML IJ SOLN
INTRAMUSCULAR | Status: AC
  Filled 2017-07-28: qty 3

## 2017-07-28 MED ORDER — FENTANYL CITRATE (PF) 100 MCG/2ML IJ SOLN
INTRAMUSCULAR | Status: AC
  Filled 2017-07-28: qty 4

## 2017-07-28 NOTE — ED Provider Notes (Signed)
Endoscopy was performed by GI.  Full bolus was evacuated.  Patient is alert and oriented x3.  Feels much improved.  Vital signs remained reassuring.  Will be discharged home with outpatient follow-up.   Doristine Devoid, PA-C 07/28/17 0209    Carmin Muskrat, MD 07/30/17 1530

## 2017-07-28 NOTE — Consult Note (Signed)
[facility] Reason for consult:Food impaction Referring Physician: ER  Billy Knox is an 77 y.o. male.  HPI: has known stricture and previous food impactions. Was eating tacos this PM and has been unable to swallow since, spitting up water and saliva.  Past Medical History:  Diagnosis Date  . 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 Colon polyp   . Coronary artery disease   . Deafness in right ear   . Esophageal stricture   . GERD (gastroesophageal reflux disease)    past history no recent problems  . Hyperchloremia   . Hypertension   . Myocardial infarction [facility]) 2007   hx. silent MI - no intervention except oral meds   . Rt facial numbness   . 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 Trigeminal neuralgia     Past Surgical History:  Procedure Laterality Date  . CYSTOSCOPY    . ESOPHAGOGASTRODUODENOSCOPY    . ESOPHAGOGASTRODUODENOSCOPY  06/14/2011   Procedure: ESOPHAGOGASTRODUODENOSCOPY (EGD);  Surgeon: Winfield Cunas., MD;  Location: [facility];  Service: Endoscopy;  Laterality: N/A;  . ESOPHAGOGASTRODUODENOSCOPY (EGD) WITH PROPOFOL N/A 08/12/2014   Procedure: ESOPHAGOGASTRODUODENOSCOPY (EGD) WITH PROPOFOL;  Surgeon: Laurence Spates, MD;  Location: [facility];  Service: Endoscopy;  Laterality: N/A;  . ESOPHAGOGASTRODUODENOSCOPY (EGD) WITH PROPOFOL N/A 10/29/2016   Procedure: ESOPHAGOGASTRODUODENOSCOPY (EGD) WITH PROPOFOL;  Surgeon: Laurence Spates, MD;  Location: [facility];  Service: Endoscopy;  Laterality: N/A;  . gamma knife     3 yrs ago [place] area around trigeminal nerve area"  . PROSTATE BIOPSY     x 3  . SAVORY DILATION  06/14/2011   Procedure: SAVORY DILATION;  Surgeon: Winfield Cunas., MD;  Location: [facility];  Service: Endoscopy;  Laterality: N/A;  need xray  . SAVORY DILATION N/A 08/12/2014   Procedure: SAVORY DILATION;  Surgeon: Laurence Spates, MD;  Location: [facility];  Service: Endoscopy;  Laterality: N/A;  . SAVORY DILATION N/A 10/29/2016   Procedure: SAVORY DILATION;  Surgeon: Laurence Spates, MD;  Location: [facility];  Service: Endoscopy;  Laterality: N/A;  . TONSILLECTOMY      child  . TRIGEMINAL NERVE BALLOON DECOMPRESSION Right    right ear deafness x 4 total    Family History  Problem Relation Age of Onset  . Colon cancer Mother   . Heart disease Mother   . Alcohol abuse Father     Social History:  reports that he quit smoking about 50 years ago. He has never used smokeless tobacco. He reports that he does not drink alcohol or use drugs.  Allergies: No Known Allergies  Medications; Prior to Admission medications   Medication Sig Start Date End Date Taking? Authorizing Provider  acetaminophen (TYLENOL) 500 MG tablet Take 1,000 mg by mouth every 6 (six) hours as needed for mild pain.    [provider]  aspirin 81 MG tablet Take 81 mg by mouth daily.    [provider]  atorvastatin (LIPITOR) 40 MG tablet Take 1 tablet (40 mg total) by mouth daily. 12/19/16 03/19/17  Jettie Booze, MD  finasteride (PROSCAR) 5 MG tablet Take 5 mg by mouth daily.    [provider]  gabapentin (NEURONTIN) 300 MG capsule Take 1 capsule (300 mg) four times daily (every 5 hours). 03/12/17   [name], MD  Glucosamine HCl 1500 MG TABS Take 1  tablet by mouth daily.     [provider]  lamoTRIgine (LAMICTAL) 100 MG tablet Take 1 tablet (100 mg total) by mouth 2 (two) times daily. 01/15/17   [name], MD  lisinopril (PRINIVIL,ZESTRIL) 20 MG tablet Take 1 tablet (20 mg total) by mouth daily. 01/01/17 04/01/17  Jettie Booze, MD  metoprolol succinate (TOPROL-XL) 50 MG 24 hr tablet Take 1 tablet (50 mg total) by mouth daily. 08/13/16   Imogene Burn, PA-C  Multiple Vitamin (MULTIVITAMIN WITH MINERALS) TABS tablet Take 1 tablet by mouth daily.    [provider]   nitroGLYCERIN (NITROSTAT) 0.4 MG SL tablet Place 1 tablet (0.4 mg total) under the tongue every 5 (five) minutes as needed for chest pain. 10/02/15   Jettie Booze, MD  Omega-3 Fatty Acids (FISH OIL) 1200 MG CAPS Take 2,400 mg by mouth 2 (two) times daily.    [provider]    PRN Meds  No results found for this or any previous visit (from the past 48 hour(s)).  No results found.            Blood pressure (!) 144/75, pulse 74, temperature 98.1 F (36.7 C), temperature source Oral, resp. rate 11, height 6\' 3"  (1.905 m), weight 91.6 kg (202 lb), SpO2 97 %.  Physical exam:   General--no distress, spitting in plastic bag ENT--nonicteric  Heart--RRR no M/G Lungs--clear Abdomen-soft nontender- Psych--A+O, no abnormalities   Assessment: 1. Food Impaction  Plan: ! Will proceed with EGD and removal. Pt aware has had before, risks discussed again.   Nancy Fetter 07/28/2017, 12:14 AM   This note was created using voice recognition software and minor errors may Have occurred unintentionally. Pager: [phone] If no answer or after hours call [phone]

## 2017-07-28 NOTE — ED Notes (Signed)
Patient is awake and alert-complaining of throat irritation-wife and patient states they are ready to go home. Explained we have to monitor patient after the conscious sedation. O2 removed to check O2 sats room air-will continue to monitor

## 2017-07-28 NOTE — Op Note (Signed)
[facility] Patient Name: Billy Knox Procedure Date: 07/28/2017 MRN: [id number] Attending MD: Nancy Fetter Dr., MD Date of Birth: July 08, 1940 CSN: [id number] Age: 77 Admit Type: Emergency Department Procedure:                Upper GI endoscopy with removal of food impaction Indications:              Foreign body in the esophagus, patient was eating                            shrimp tacos and has been unable to swallow saliva                            or liquids for 3 or 4 hours he has a history of                            stricture, abnormal motility of the esophagus. Providers:                Joyice Faster. Tremeka Helbling Dr., MD, Angus Seller, Laurena Spies, Technician Referring MD:             Dr. Hulan Fess Medicines:                Fentanyl 50 micrograms IV, Midazolam 5 mg IV,                            Cetacaine spray Complications:            No immediate complications. Estimated Blood Loss:     Estimated blood loss: none. Procedure:                Pre-Anesthesia Assessment:                           - Prior to the procedure, a History and Physical                            was performed, and patient medications and                            allergies were reviewed. The patient's tolerance of                            previous anesthesia was also reviewed. The risks                            and benefits of the procedure and the sedation                            options and risks were discussed with the patient.                            All questions were answered, and informed consent  was obtained. Prior Anticoagulants: The patient has                            taken no previous anticoagulant or antiplatelet                            agents. ASA Grade Assessment: II - A patient with                            mild systemic disease. After reviewing the risks                            and  benefits, the patient was deemed in                            satisfactory condition to undergo the procedure.                           After obtaining informed consent, the endoscope was                            passed under direct vision. Throughout the                            procedure, the patient's blood pressure, pulse, and                            oxygen saturations were monitored continuously. The                            EG-[id number] ([id number]) scope was introduced through the                            mouth, and advanced to the second part of duodenum.                            The upper GI endoscopy was accomplished without                            difficulty. The patient tolerated the procedure                            well. Scope In: Scope Out: Findings:      Food was found in the distal esophagus. Removal of food was       accomplished. This was performed by pushing each piece of food down into       the stomach. We also used to vigorous waterjet to wash the smaller       pieces down to the stomach.      There is no endoscopic evidence of esophagitis, stricture or ulcerations       in the entire esophagus.      There were esophageal mucosal changes suspicious for short-segment       Barrett's esophagus present in the lower third of the esophagus.  The stomach was normal.      The examined duodenum was normal. Impression:               - Food in the distal esophagus. Removal was                            successful.                           - Esophageal mucosal changes suspicious for                            short-segment Barrett's esophagus.                           - Normal stomach.                           - Normal examined duodenum. Moderate Sedation:      Moderate (conscious) sedation was administered by the endoscopy nurse       and supervised by the endoscopist. The following parameters were       monitored: oxygen saturation, heart rate,  blood pressure, respiratory       rate, EKG, adequacy of pulmonary ventilation, and response to care. Recommendation:           - Patient has a contact number available for                            emergencies. The signs and symptoms of potential                            delayed complications were discussed with the                            patient. Return to normal activities tomorrow.                            Written discharge instructions were provided to the                            patient.                           - Clear liquid diet for 2 hours.                           - Continue present medications.                           - Return to endoscopist PRN. Procedure Code(s):        --- Professional ---                           [id number], Esophagogastroduodenoscopy, flexible,                            transoral; with removal of foreign body(s) Diagnosis Code(s):        ---  Professional ---                           X51.700F, Food in esophagus causing other injury,                            initial encounter                           T18.108A, Unspecified foreign body in esophagus                            causing other injury, initial encounter                           K22.8, Other specified diseases of esophagus CPT copyright 2017 American [employer] Association. All rights reserved. The codes documented in this report are preliminary and upon coder review may  be revised to meet current compliance requirements. Nancy Fetter Dr., MD 07/28/2017 1:09:53 AM This report has been signed electronically. Number of Addenda: 0

## 2017-07-28 NOTE — H&P (Signed)
Please see ER consult note

## 2017-07-28 NOTE — Discharge Instructions (Addendum)
Follow-up as needed with your GI doctor.  Return the ED with worsening symptoms.

## 2017-07-29 ENCOUNTER — Encounter (HOSPITAL_COMMUNITY): Payer: Self-pay | Admitting: Gastroenterology

## 2017-07-30 ENCOUNTER — Telehealth: Payer: Self-pay | Admitting: Neurology

## 2017-07-30 MED ORDER — GABAPENTIN 100 MG PO CAPS: 100.0000 mg | ORAL_CAPSULE | Freq: Every evening | ORAL | 3 refills | Status: DC | PRN

## 2017-07-30 NOTE — Telephone Encounter (Signed)
Returned pt's call. He stated he takes the prescribed Gabapentin 300 mg QID (every 5 hours).  He states that sometimes he needs an additional 100 mg during the night (he still had these from his surgery in 2017 and just ran out). He will wake up and has burning in the back of his head from the operation. He has been doing this for the past two years. RN advised she would d/w Dr. Jaynee Eagles and call him back. He verbalized appreciation.

## 2017-07-30 NOTE — Addendum Note (Signed)
Addended by: Gildardo Griffes on: 07/30/2017 11:26 AM   Modules accepted: Orders

## 2017-07-30 NOTE — Telephone Encounter (Signed)
Pt is requesting new rx for gabapentin 100mg  to take if he wakes up during the night to get back to sleep. He got this at [facility] in 2017 after trigeminal neuralgia surgery and has just ran out. Pharmacy: [facility]

## 2017-07-30 NOTE — Telephone Encounter (Signed)
Called pt & informed him that the Gabapentin 100 mg was sent to his pharmacy that he can take at night if he needs it. He verbalized appreciation.

## 2017-08-12 IMAGING — CT CT CERVICAL SPINE W/O CM
2 of 11 series · 6 of 33 positions shown, 7 images · non-contrast
Comparison: None.

CLINICAL DATA: Pain following fall

EXAM:
CT HEAD WITHOUT CONTRAST
CT MAXILLOFACIAL WITHOUT CONTRAST
CT CERVICAL SPINE WITHOUT CONTRAST
TECHNIQUE: Multidetector CT imaging of the head, cervical spine, and
maxillofacial structures were performed using the standard protocol
without intravenous contrast. Multiplanar CT image reconstructions
of the cervical spine and maxillofacial structures were also
generated.

[Series 14: facialbone 2.0 sag st · sagittal · 0.36mm/px · 4 of 94 slices shown]
[im 19/94  bone]
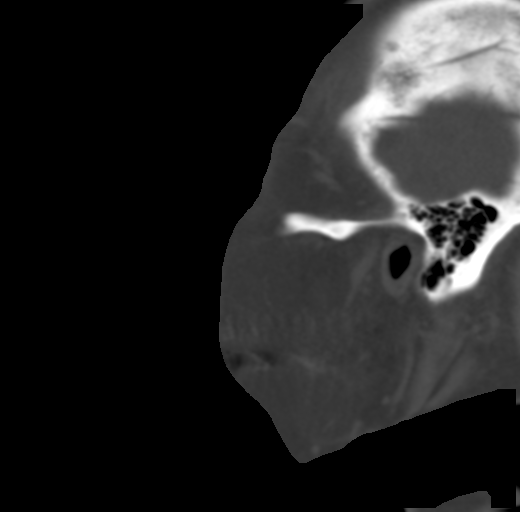
[im 38/94  bone]
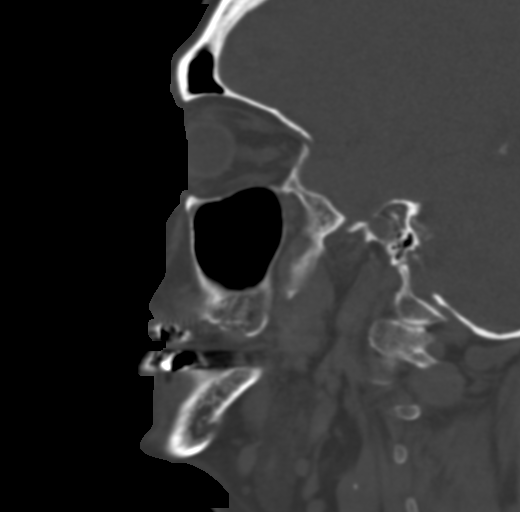
[im 56/94  bone]
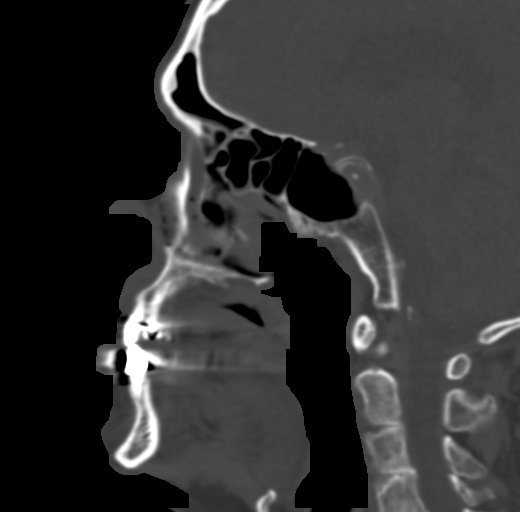
[im 75/94  bone]
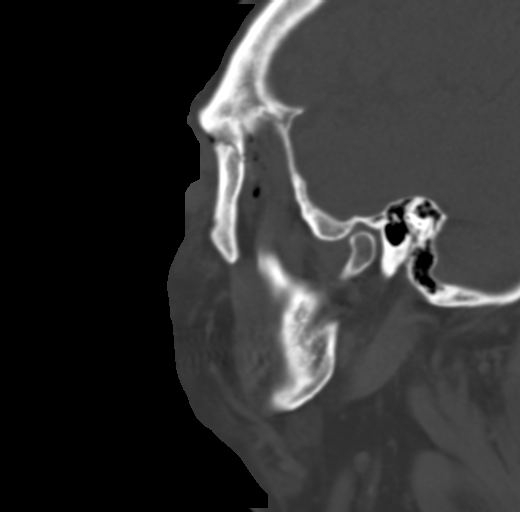

[Series 19: c_spine 2.0 orthogonals · axial · 0.21mm/px · z∈[-421,-349]mm · 2 of 107 slices shown, 3 images]
[im 36/107  soft-tissue]
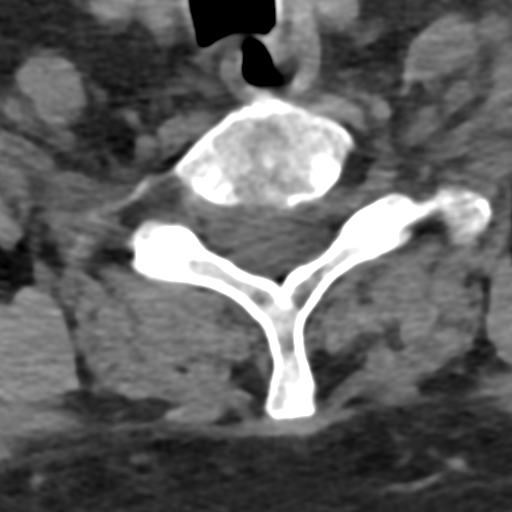
[im 36/107  bone]
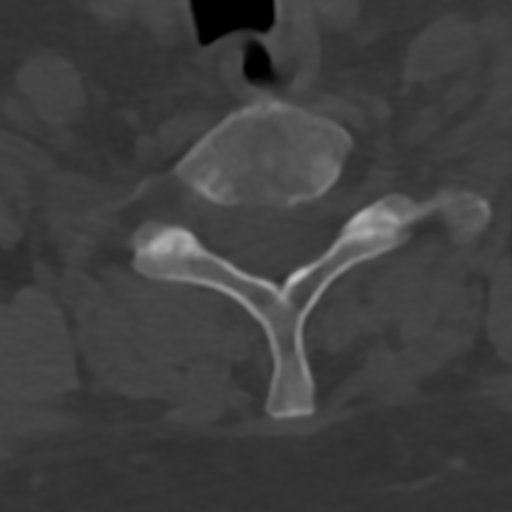
[im 71/107  bone]
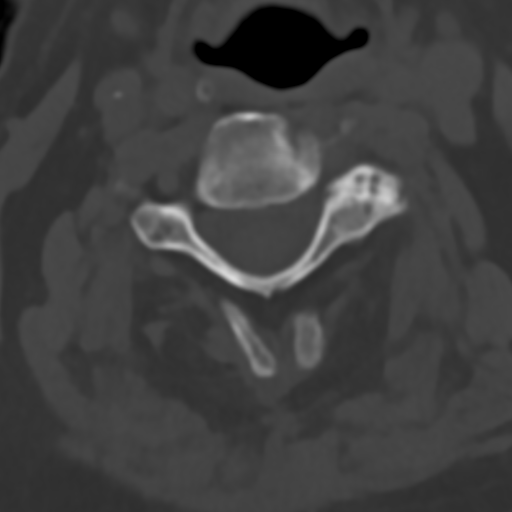

[6 of 33 positions shown; findings below may reference images not displayed]

FINDINGS: CT HEAD FINDINGS

Brain: There is mild diffuse atrophy. There is no intracranial mass,
hemorrhage, extra-axial fluid collection, or midline shift. There is
patchy small vessel disease throughout the centra semiovale
bilaterally. No acute infarct is evident.

Vascular: No hyperdense vessel. There is calcification in each
carotid siphon region.

Skull: There is a postoperative defect in the right occipital bone.
No acute calvarial fracture is appreciable.

Other: Mastoid air cells are clear.

CT MAXILLOFACIAL FINDINGS

Osseous: There is a fracture of the lateral left orbital wall with
air tracking through this area into the lateral left orbit. There
are fractures of the left anterior and lateral maxillary sinus walls
with several displaced fragments in the lateral maxillary antral
wall region. L There is a nondisplaced fracture of the left orbital
floor with slight inferior displacement of the inferior rectus
muscle on the left. There is a fracture at the zygomatic frontal
suture on the left with slight displacement of fracture fragments.
The remainder of the zygomatic arch on the left is intact. There is
a fracture of the superomedial aspect of the left maxillary antrum
with fluid/hemorrhage protruding through this area into the left
nasal cavity. No fracture to the right of midline evident. The
pterygoid plates bilaterally are intact. There is no frank
dislocation.

Orbits: There is extensive preseptal soft tissue swelling on the
left. There are foci of air in left orbit. There is minimal inferior
displacement inferior rectus muscle on the left at the site of
orbital floor fracture. There is no hemorrhage or mass within either
orbit.

Sinuses: There is an air-fluid level in the left maxillary antrum.
Fluid is noted throughout the left maxillary antrum inferiorly
extending medially into the left naris. There is opacification of
several ethmoid air cells bilaterally, more severe on the left than
on the right. The left ostiomeatal unit complex is obstructed. The
right maxillary antrum is clear. The sphenoid and frontal sinuses
are clear. The ostiomeatal unit complex on the right is patent.
There is obstruction of the left naris, likely due to hemorrhage.
Right naris is patent.

Soft tissues: There is tissue swelling over the left mid upper face
with areas of soft tissue air lateral to the left maxillary antrum
and lateral left orbital wall. No abscess. Salivary glands appear
normal. No adenopathy. Visualized pharynx appears normal. Tongue and
tongue base regions appear normal.

CT CERVICAL SPINE FINDINGS

Alignment: There is no spondylolisthesis.

Skull base and vertebrae: Skull base and craniocervical junction
regions appear normal. No evident fracture. No blastic or lytic bone
lesions.

Soft tissues and spinal canal: Prevertebral soft tissues and
predental space regions are normal. There is no paraspinous lesion.
No spinal stenosis. No cord or canal hematoma evident.

Disc levels: There is fairly severe disc space narrowing at C6-7.
There is moderate disc space narrowing at C4-5, C5-6, and C7-T1.
There is facet hypertrophy at multiple levels. There is moderate
exit foraminal bilaterally at C6-7, more severe on the left than on
the right, at C5-6, more severe on the right than the left. Milder
exit foraminal narrowing is noted on the left at C3-4 and C4-5. No
frank disc extrusion evident.

Upper chest: Visualized lungs are clear.

Other: None
IMPRESSION: CT head: Atrophy with supratentorial small vessel disease. No
intracranial mass hemorrhage, or extra-axial fluid collection. No
acute infarct evident. Previous surgical removal a portion of the
lateral right occipital bone. Areas of arterial vascular
calcification noted.

CT maxillofacial: There is a fracture of the left zygomatic frontal
suture with mild displacement. There is a comminuted fracture of the
left lateral orbital wall with air medial and lateral to this
fracture with air lateral to left lobe posteriorly. There are
fractures of the medial and lateral aspects of the anterior left
maxillary antrum as well as comminuted fracture along the more
posterior aspect lateral left maxillary antrum. There is a fracture
of the medial left maxillary antrum along superior aspect. There is
a nondisplaced orbital floor fracture. The left inferior rectus
muscle is minimally displaced inferiorly at the site of this
fracture. No dislocations. No fracture to the right of midline
evident.

Air-fluid level in left maxillary antrum with hemorrhage and fluid
tracking from the left maxillary antrum into the left nasal cavity.
There is obstruction of most of the left naris, likely due to
hemorrhage. There is obstruction of the left ostiomeatal unit
complex with hemorrhage and fluid in multiple left ethmoid air
cells. Several right-sided ethmoid air cells show mucosal
thickening.

Soft tissue swelling over left mid upper face without well-defined
mass. Preseptal edema over the left orbit.

CT cervical spine: No fracture or spondylolisthesis. Multilevel
arthropathy.

## 2017-09-26 ENCOUNTER — Other Ambulatory Visit: Payer: Self-pay | Admitting: Physician Assistant

## 2017-09-29 ENCOUNTER — Other Ambulatory Visit: Payer: Self-pay | Admitting: Interventional Cardiology

## 2017-09-29 MED ORDER — LISINOPRIL 20 MG PO TABS: 20.0000 mg | ORAL_TABLET | Freq: Every day | ORAL | 0 refills | Status: DC

## 2017-10-01 ENCOUNTER — Encounter: Payer: Self-pay | Admitting: Neurology

## 2017-10-01 ENCOUNTER — Ambulatory Visit: Payer: Medicare Other | Admitting: Neurology

## 2017-10-01 DIAGNOSIS — G500 Trigeminal neuralgia: Secondary | ICD-10-CM | POA: Diagnosis not present

## 2017-10-01 MED ORDER — LAMOTRIGINE 100 MG PO TABS
100.0000 mg | ORAL_TABLET | Freq: Two times a day (BID) | ORAL | 4 refills | Status: DC
Start: 2017-10-01 — End: 2020-08-14

## 2017-10-01 MED ORDER — GABAPENTIN 300 MG PO CAPS: 300.0000 mg | ORAL_CAPSULE | ORAL | 11 refills | Status: DC | PRN

## 2017-10-01 MED ORDER — LAMOTRIGINE 100 MG PO TABS: 100.0000 mg | ORAL_TABLET | Freq: Two times a day (BID) | ORAL | 4 refills | Status: DC

## 2017-10-01 NOTE — Progress Notes (Signed)
[name]    Provider:  Dr Jaynee Eagles Referring Provider: Hulan Fess, MD Primary Care Physician:  Hulan Fess, MD   CC:  Trigeminal Neuralgia  Interval history 10/02/2017: He is here for follow up of Trigeminal Neuralgia. Extensive history of this including surgeries and failure of multiple meds including botox. He is on Gabapentin and Lamictal. He says he feels worse with increased area of pain on the right in the trigeminal area, and in the incision site. If he is having a bad day he takes Gabapentin. Still having ififculty touching face, especially shaving. He has lost more taste.  He also feels he has lost smell and taste. He denies any tremors of the hands or falls. He has a slight head tremor that he notices nothing significant. He walking well, no falls. Discussed he is not parkinsonian on exam. Otherwise for TGN, the medicine is working, if he misses the pain gets worse. Feels pain is "under control" currently. Discussed increased Gabapentin can take it every 3 hours as needed, may increase Lamictal as well. Discussed. He has ben to [facility] and multiple other institutions. Injecting alcohol into the nerve helped once.   Interval history 09/24/2016: botulinum toxin in the right trigeminal area did not help. He has continued pain in the are of the scar and now getting more pain in the occipital area, sharp, burning since the surgery suspect injury to the occipital nerve. He takes a 300mg  gabapentin every 5.5 hours which helps. He has not tried Lamictal, baclofen, topiramate, valproate. Will try Lamictal  HPI:  Billy Knox is a 77 y.o. male here as a referral from Dr. Rex Kras for trigeminal neuralgia. 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 He is status post gamma knife radiosurgical rhizotomy in 2012 and recent dorsal root entry zone  surgery. He has had 4 surgeries as far back as the 90s. Started 25 years ago first surgery was in 1992. He has seen in December at [facility] correct and his neurontin was increased and he had a fall. He lost feeling in his left lip after recent fall (see CT below). Yesterday he had pain like a hot poker through the scalp (Right high parietal lobe) and now pain around the right side of the ear. Since the DREZ he still gets flashes of pain in the right side of the face. Feels like someone has hit him all the time. Throbbing and pounding. With light and sound sensitivity and nausea. No medication overuse. He says the trigeminal nerve pain shoots into all three zones (v1,v2,v3). When he first got the trigeminal neuralgia food would trigger it. He has also been to the head of Neurology at [facility] as well as [facility]. Unknown triggers, sporadic, sometimes touching his face with cause the pain, turning the head, wind and chewing. Lightning, severe, brief. He has an aching pain on the right which is constant. Like a pressure. He has daily headaches and half of them a month are migrainous (unilateral right side of head, light, sound sensitivity, pounding, throbbing, nausea) ongoing at this frequency for years. No aura. No medication overuse. He also has neck pain and stiff muscles on the right with decreased ROM, slowly progressive, cannot use muscle relaxers due to side effects and sedation and risk of falls, has tried massage and PT without help, pain and decreased ROM.   Meds tried include: Oxcarbazepine, Vimpat, gabapentin, clonazepam, tiagabine, oxycodone, flexeril, tramadol,  naltrexone, Tegretol(did not help).   Reviewed notes, labs and imaging from outside physicians, which showed:   CBC showed elevated white blood cells 3 weeks ago 51.0 with neutrophilic predominance otherwise normal. CMP showed normal sodium, chloride 100, glucose 108, total protein 6 otherwise unremarkable.  Personally reviewed  images CT of the head and agree with the following:  IMPRESSION: CT head 02/26/2016 (in ED for a fall): Atrophy with supratentorial small vessel disease. No intracranial mass hemorrhage, or extra-axial fluid collection. No acute infarct evident. Previous surgical removal a portion of the lateral right occipital bone. Areas of arterial vascular calcification noted.  Primary diagnosis patient is suffering from trigeminal neuralgia for several decades. He was operated with microvascular decompression several times also treated with gamma knife without much effect. He recently stopped oxycodone. He was recently treated with DREZ by Dr. Mearl Latin and is complaining about postprocedural resurgence of neuralgic pain in V2 and also retro-orbital pain always on the right side. Diagnosed with postprocedural flare up. They added low-dose naltrexone increased gabapentin and also added Vimpat. Repeating his neuro imaging was suggested.   Review of Systems: Patient complains of symptoms per HPI as well as the following symptoms: No CP, no SOB. Memory loss, joint pain, back pain, Pertinent negatives per HPI. All others negative.  Social History   Socioeconomic History  . Marital status: Married    Spouse name: Not on file  . Number of children: 1  . Years of education: 63  . Highest education level: Not on file  Occupational History  . Occupation: Retired  Scientific laboratory technician  . Financial resource strain: Not on file  . Food insecurity:    Worry: Not on file    Inability: Not on file  . Transportation needs:    Medical: Not on file    Non-medical: Not on file  Tobacco Use  . Smoking status: Former Smoker    Last attempt to quit: 06/14/1967    Years since quitting: 50.3  . Smokeless tobacco: Never Used  Substance and Sexual Activity  . Alcohol use: No    Alcohol/week: 1.0 standard drinks    Types: 1 Standard drinks or equivalent per week  . Drug use: No  . Sexual activity: Not on file  Lifestyle  .  Physical activity:    Days per week: Not on file    Minutes per session: Not on file  . Stress: Not on file  Relationships  . Social connections:    Talks on phone: Not on file    Gets together: Not on file    Attends religious service: Not on file    Active member of club or organization: Not on file    Attends meetings of clubs or organizations: Not on file    Relationship status: Not on file  . Intimate partner violence:    Fear of current or ex partner: Not on file    Emotionally abused: Not on file    Physically abused: Not on file    Forced sexual activity: Not on file  Other Topics Concern  . Not on file  Social History Narrative   Lives at home w/ his wife   Right-handed   Caffeine: 2 cups per day    Family History  Problem Relation Age of Onset  . Colon cancer Mother   . Heart disease Mother   . Liver cancer Sister   . Alcohol abuse Father     Past Medical History:  Diagnosis Date  . 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 Colon polyp   . Coronary artery disease   . Deafness in right ear   . Esophageal stricture   . GERD (gastroesophageal reflux disease)    past history no recent problems  . Hyperchloremia   . Hypertension   . Macular degeneration   . Myocardial infarction [facility]) 2007   hx. silent MI - no intervention except oral meds   . Ringing in ears    right  . Rt facial numbness   . 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 Sudden blockage of esophagus 07/2017  . Trigeminal neuralgia     Past Surgical History:  Procedure Laterality Date  . CYSTOSCOPY    . ESOPHAGOGASTRODUODENOSCOPY    . ESOPHAGOGASTRODUODENOSCOPY  06/14/2011   Procedure: ESOPHAGOGASTRODUODENOSCOPY (EGD);  Surgeon: Winfield Cunas., MD;  Location: [facility];  Service: Endoscopy;  Laterality: N/A;  . ESOPHAGOGASTRODUODENOSCOPY N/A 07/28/2017   Procedure: ESOPHAGOGASTRODUODENOSCOPY (EGD);   Surgeon: Laurence Spates, MD;  Location: [facility];  Service: Endoscopy;  Laterality: N/A;  . ESOPHAGOGASTRODUODENOSCOPY (EGD) WITH PROPOFOL N/A 08/12/2014   Procedure: ESOPHAGOGASTRODUODENOSCOPY (EGD) WITH PROPOFOL;  Surgeon: Laurence Spates, MD;  Location: [facility];  Service: Endoscopy;  Laterality: N/A;  . ESOPHAGOGASTRODUODENOSCOPY (EGD) WITH PROPOFOL N/A 10/29/2016   Procedure: ESOPHAGOGASTRODUODENOSCOPY (EGD) WITH PROPOFOL;  Surgeon: Laurence Spates, MD;  Location: [facility];  Service: Endoscopy;  Laterality: N/A;  . FOREIGN BODY REMOVAL  07/28/2017   Procedure: FOREIGN BODY REMOVAL;  Surgeon: Laurence Spates, MD;  Location: [facility];  Service: Endoscopy;;  . gamma knife     3 yrs ago [place] area around trigeminal nerve area"  . PROSTATE BIOPSY     x 3  . SAVORY DILATION  06/14/2011   Procedure: SAVORY DILATION;  Surgeon: Winfield Cunas., MD;  Location: [facility];  Service: Endoscopy;  Laterality: N/A;  need xray  . SAVORY DILATION N/A 08/12/2014   Procedure: SAVORY DILATION;  Surgeon: Laurence Spates, MD;  Location: [facility];  Service: Endoscopy;  Laterality: N/A;  . SAVORY DILATION N/A 10/29/2016   Procedure: SAVORY DILATION;  Surgeon: Laurence Spates, MD;  Location: [facility];  Service: Endoscopy;  Laterality: N/A;  . TONSILLECTOMY      child  . TRIGEMINAL NERVE BALLOON DECOMPRESSION Right    right ear deafness x 4 total    Current Outpatient Medications  Medication Sig Dispense Refill  . acetaminophen (TYLENOL) 500 MG tablet Take 1,000 mg by mouth every 6 (six) hours as needed for mild pain.    Marland Kitchen aspirin 81 MG tablet Take 81 mg by mouth daily.    Marland Kitchen atorvastatin (LIPITOR) 40 MG tablet Take 1 tablet (40 mg total) by mouth daily. 90 tablet 3  . finasteride (PROSCAR) 5 MG tablet Take 5 mg by mouth daily.    Marland Kitchen gabapentin (NEURONTIN) 300 MG capsule Take 1 capsule (300 mg total) by mouth every 3 (three) hours as needed. 200 capsule 11  . Glucosamine HCl  (GLUCOSAMINE PO) Take 1,200 mg by mouth daily.    Marland Kitchen lamoTRIgine (LAMICTAL) 100 MG tablet Take 1 tablet (100 mg total) by mouth 2 (two) times daily. 180 tablet 4  . lisinopril (PRINIVIL,ZESTRIL) 20 MG tablet Take 1 tablet (20 mg total) by mouth daily. Please make overdue appt with Dr. Irish Lack before anymore refills. 1st attempt 30 tablet 0  . metoprolol succinate (TOPROL-XL) 50 MG 24  hr tablet Take 1 tablet (50 mg total) by mouth daily. Patient needs to call and schedule an appointment for further refills 1st attempt 30 tablet 0  . Multiple Vitamin (MULTIVITAMIN WITH MINERALS) TABS tablet Take 1 tablet by mouth daily.    . Omega-3 Fatty Acids (FISH OIL) 1200 MG CAPS Take 4,800 mg by mouth 2 (two) times daily.     . Omeprazole (PRILOSEC PO) Take by mouth daily.    . nitroGLYCERIN (NITROSTAT) 0.4 MG SL tablet Place 1 tablet (0.4 mg total) under the tongue every 5 (five) minutes as needed for chest pain. 25 tablet 6   No current facility-administered medications for this visit.     Allergies as of 10/01/2017  . (No Known Allergies)    Vitals: BP 131/81 (BP Location: Right Arm, Patient Position: Sitting)   Pulse 66   Ht 6' 0.75" (1.848 m)   Wt 207 lb (93.9 kg)   BMI 27.50 kg/m  Last Weight:  Wt Readings from Last 1 Encounters:  10/01/17 207 lb (93.9 kg)   Last Height:   Ht Readings from Last 1 Encounters:  10/01/17 6' 0.75" (1.848 m)    Assessment/Plan: [age] year old male here with 25+ years of trigeminal neuralgia and 4 surgeries, migraines,  failed multiple medications. Failed botulinum toxin. He feels he is "under control" doesn;t want to change anything  He says he has some loss of smell and taste and constipation. He is not parkinsonian on exam. Occ head tremor may be essential tremor but not c/w PD.   Botox injections were not for patient. 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 Patient is refractory. He has tried multiple medications. He is not on Lamictal.  Can increase Lamictal next if needed for the TGN. He doesn't want to try Gabapentin ER, he is used to taking Gabapentin multiple times a day as needed.   Dr. Rex Kras can refill current meds, he can return to pcp and come back if needed if pain worsens.   Refill meds. Monitor yearly for progression or TGN or progression to PD.  Meds ordered this encounter  Medications  . gabapentin (NEURONTIN) 300 MG capsule    Sig: Take 1 capsule (300 mg total) by mouth every 3 (three) hours as needed.    Dispense:  200 capsule    Refill:  11  . lamoTRIgine (LAMICTAL) 100 MG tablet    Sig: Take 1 tablet (100 mg total) by mouth 2 (two) times daily.    Dispense:  180 tablet    Refill:  4      Cc: Dr. Simmie Davies, MD  [facility] [address] [place], [place] [address]  Phone [phone] Fax [phone]  A total of 25 minutes was spent face-to-face with this patient. Over half this time was spent on counseling patient on the trigeminal neuralgia, occipital neuropathy diagnosis and different diagnostic and therapeutic options available.

## 2017-10-27 ENCOUNTER — Other Ambulatory Visit: Payer: Self-pay | Admitting: Interventional Cardiology

## 2017-10-27 MED ORDER — LISINOPRIL 20 MG PO TABS: 20.0000 mg | ORAL_TABLET | Freq: Every day | ORAL | 0 refills | Status: DC

## 2017-10-27 MED ORDER — ATORVASTATIN CALCIUM 40 MG PO TABS: 40.0000 mg | ORAL_TABLET | Freq: Every day | ORAL | 0 refills | Status: DC

## 2017-10-27 MED ORDER — METOPROLOL SUCCINATE ER 50 MG PO TB24: 50.0000 mg | ORAL_TABLET | Freq: Every day | ORAL | 0 refills | Status: DC

## 2017-10-27 NOTE — Telephone Encounter (Signed)
Pt's medications were sent to pt's pharmacy as requested. Confirmation received.  

## 2017-10-27 NOTE — Telephone Encounter (Signed)
° ° ° °*  STAT* If patient is at the pharmacy, call can be transferred to refill team.   1. Which medications need to be refilled? (please list name of each medication and dose if known) lisinopril (PRINIVIL,ZESTRIL) 20 MG tablet, atorvastatin (LIPITOR) 40 MG tablet,  metoprolol succinate (TOPROL-XL) 50 MG 24 hr tablet,   2. Which pharmacy/location (including street and city if local pharmacy) is medication to be sent to? [facility] - [place], [place] - [address] AT [place]  3. Do they need a 30 day or 90 day supply? [place]

## 2017-10-29 ENCOUNTER — Telehealth: Payer: Self-pay | Admitting: Physician Assistant

## 2017-10-29 NOTE — Telephone Encounter (Signed)
Please advise if you would like for patient to have labs prior to appointment.

## 2017-10-29 NOTE — Telephone Encounter (Signed)
New Message:    Pt wants to know if he needs lab work before his appt with Estella Husk. On 11-25-17? If so, please order it and let him know.

## 2017-10-30 NOTE — Telephone Encounter (Signed)
He had fasting lipids in March that looked good so shouldn't need those until March 2020. I don't see a bmet in the chart since 2018. If he hasn't had blood work by PCP we could check a bmet & CBC but he can have that done at the office visit and doesn't have to be fasting.

## 2017-10-30 NOTE — Telephone Encounter (Signed)
Called and made patient aware. 

## 2017-11-07 DIAGNOSIS — R972 Elevated prostate specific antigen [PSA]: Secondary | ICD-10-CM | POA: Diagnosis not present

## 2017-11-14 DIAGNOSIS — R972 Elevated prostate specific antigen [PSA]: Secondary | ICD-10-CM | POA: Diagnosis not present

## 2017-11-14 DIAGNOSIS — N401 Enlarged prostate with lower urinary tract symptoms: Secondary | ICD-10-CM | POA: Diagnosis not present

## 2017-11-14 DIAGNOSIS — R3915 Urgency of urination: Secondary | ICD-10-CM | POA: Diagnosis not present

## 2017-11-24 NOTE — Progress Notes (Signed)
Cardiology Office Note    Date:  11/25/2017   ID:  Billy Knox, DOB 06/03/1940, MRN [id number]  PCP:  Hulan Fess, MD  Cardiologist: Larae Grooms, MD EPS: None  No chief complaint on file.   History of Present Illness:  Billy Knox is a 77 y.o. male with history of CAD status post CTO of the RCA with left to right collaterals managed medically.  Had a syncopal episode after neurosurgery in 2018 felt secondary to overmedication with his neurological drugs.  Also has hypertension, HLD, obesity.  Last saw Dr. Irish Lack 09/2016 at which time he was doing well.  Patient comes in for yearly f/u. Has lost 30 lbs by cutting out starches and carbs. Walks 3-5 miles/day. Walked 1100 miles so far this year!  Also does ballroom dancing.  Denies chest pain, palpitations, dyspnea, dizziness or presyncope.  He feels healthier than he has been many years.   Past Medical History:  Diagnosis Date  . 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 Colon polyp   . Coronary artery disease   . Deafness in right ear   . Esophageal stricture   . GERD (gastroesophageal reflux disease)    past history no recent problems  . Hyperchloremia   . Hypertension   . Macular degeneration   . Myocardial infarction [facility]) 2007   hx. silent MI - no intervention except oral meds   . Ringing in ears    right  . Rt facial numbness   . 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 Sudden blockage of esophagus 07/2017  . Trigeminal neuralgia     Past Surgical History:  Procedure Laterality Date  . CYSTOSCOPY    . ESOPHAGOGASTRODUODENOSCOPY    . ESOPHAGOGASTRODUODENOSCOPY  06/14/2011   Procedure: ESOPHAGOGASTRODUODENOSCOPY (EGD);  Surgeon: Winfield Cunas., MD;  Location: [facility];  Service: Endoscopy;  Laterality: N/A;  . ESOPHAGOGASTRODUODENOSCOPY N/A 07/28/2017   Procedure: ESOPHAGOGASTRODUODENOSCOPY (EGD);  Surgeon:  Laurence Spates, MD;  Location: [facility];  Service: Endoscopy;  Laterality: N/A;  . ESOPHAGOGASTRODUODENOSCOPY (EGD) WITH PROPOFOL N/A 08/12/2014   Procedure: ESOPHAGOGASTRODUODENOSCOPY (EGD) WITH PROPOFOL;  Surgeon: Laurence Spates, MD;  [facility];  Service: Endoscopy;  Laterality: N/A;  . ESOPHAGOGASTRODUODENOSCOPY (EGD) WITH PROPOFOL N/A 10/29/2016   Procedure: ESOPHAGOGASTRODUODENOSCOPY (EGD) WITH PROPOFOL;  Surgeon: Laurence Spates, MD;  [facility];  Service: Endoscopy;  Laterality: N/A;  . FOREIGN BODY REMOVAL  07/28/2017   Procedure: FOREIGN BODY REMOVAL;  Surgeon: Laurence Spates, MD;  Location: [facility];  Service: Endoscopy;;  . gamma knife     3 yrs ago [place] area around trigeminal nerve area"  . PROSTATE BIOPSY     x 3  . SAVORY DILATION  06/14/2011   Procedure: SAVORY DILATION;  Surgeon: Winfield Cunas., MD;  Location: [facility];  Service: Endoscopy;  Laterality: N/A;  need xray  . SAVORY DILATION N/A 08/12/2014   Procedure: SAVORY DILATION;  Surgeon: Laurence Spates, MD;  [facility];  Service: Endoscopy;  Laterality: N/A;  . SAVORY DILATION N/A 10/29/2016   Procedure: SAVORY DILATION;  Surgeon: Laurence Spates, MD;  [facility]: [facility];  Service: Endoscopy;  Laterality: N/A;  . TONSILLECTOMY      child  . TRIGEMINAL NERVE BALLOON DECOMPRESSION Right    right ear deafness x 4 total    Current Medications: Current Meds  Medication Sig  .  acetaminophen (TYLENOL) 500 MG tablet Take 1,000 mg by mouth every 6 (six) hours as needed for mild pain.  Marland Kitchen aspirin 81 MG tablet Take 81 mg by mouth daily.  Marland Kitchen atorvastatin (LIPITOR) 40 MG tablet Take 1 tablet (40 mg total) by mouth daily. Please keep upcoming appt in November for future refills. Thank you  . finasteride (PROSCAR) 5 MG tablet Take 5 mg by mouth daily.  Marland Kitchen gabapentin (NEURONTIN) 300 MG capsule Take 300 mg by mouth 4 (four) times daily.  . Glucosamine HCl (GLUCOSAMINE PO) Take  1,200 mg by mouth daily.  Marland Kitchen lamoTRIgine (LAMICTAL) 100 MG tablet Take 1 tablet (100 mg total) by mouth 2 (two) times daily.  Marland Kitchen lisinopril (PRINIVIL,ZESTRIL) 20 MG tablet Take 1 tablet (20 mg total) by mouth daily. Please keep upcoming appt in November for future refills. Thank you  . metoprolol succinate (TOPROL-XL) 50 MG 24 hr tablet Take 1 tablet (50 mg total) by mouth daily. Please keep upcoming appt in November for future refills. Thank you  . Multiple Vitamin (MULTIVITAMIN WITH MINERALS) TABS tablet Take 1 tablet by mouth daily.  . nitroGLYCERIN (NITROSTAT) 0.4 MG SL tablet Place 1 tablet (0.4 mg total) under the tongue every 5 (five) minutes as needed for chest pain.  . Omega-3 Fatty Acids (FISH OIL) 1200 MG CAPS Take 4,800 mg by mouth 2 (two) times daily.   . Omeprazole (PRILOSEC PO) Take by mouth daily.     Allergies:   Patient has no known allergies.   Social History   Socioeconomic History  . Marital status: Married    Spouse name: Not on file  . Number of children: 1  . Years of education: 78  . Highest education level: Not on file  Occupational History  . Occupation: Retired  Scientific laboratory technician  . Financial resource strain: Not on file  . Food insecurity:    Worry: Not on file    Inability: Not on file  . Transportation needs:    Medical: Not on file    Non-medical: Not on file  Tobacco Use  . Smoking status: Former Smoker    Last attempt to quit: 06/14/1967    Years since quitting: 50.4  . Smokeless tobacco: Never Used  Substance and Sexual Activity  . Alcohol use: No    Alcohol/week: 1.0 standard drinks    Types: 1 Standard drinks or equivalent per week  . Drug use: No  . Sexual activity: Not on file  Lifestyle  . Physical activity:    Days per week: Not on file    Minutes per session: Not on file  . Stress: Not on file  Relationships  . Social connections:    Talks on phone: Not on file    Gets together: Not on file    Attends religious service: Not on file      Active member of club or organization: Not on file    Attends meetings of clubs or organizations: Not on file    Relationship status: Not on file  Other Topics Concern  . Not on file  Social History Narrative   Lives at home w/ his wife   Right-handed   Caffeine: 2 cups per day     Family History:  The patient's family history includes Alcohol abuse in his father; Colon cancer in his mother; Heart disease in his mother; Liver cancer in his sister.   ROS:   Please see the history of present illness.    Review of Systems  Constitution: Negative.  HENT: Negative.   Cardiovascular: Negative.   Respiratory: Negative.   Endocrine: Negative.   Hematologic/Lymphatic: Negative.   Musculoskeletal: Negative.   Gastrointestinal: Negative.   Genitourinary: Negative.   Neurological: Negative.    All other systems reviewed and are negative.   PHYSICAL EXAM:   VS:  BP 130/84   Pulse 63   Ht 6' (1.829 m)   Wt 212 lb 12.8 oz (96.5 kg)   SpO2 94%   BMI 28.86 kg/m   Physical Exam  GEN: Well nourished, well developed, in no acute distress  Neck: no JVD, carotid bruits, or masses Cardiac:RRR; no murmurs, rubs, or gallops  Respiratory:  clear to auscultation bilaterally, normal work of breathing GI: soft, nontender, nondistended, + BS Ext: without cyanosis, clubbing, or edema, Good distal pulses bilaterally Neuro:  Alert and Oriented x 3 Psych: euthymic mood, full affect  Wt Readings from Last 3 Encounters:  11/25/17 212 lb 12.8 oz (96.5 kg)  10/01/17 207 lb (93.9 kg)  07/27/17 202 lb (91.6 kg)      Studies/Labs Reviewed:   EKG:  EKG isordered today.  The ekg ordered today demonstrates normal sinus rhythm, normal EKG  Recent Labs: 03/19/2017: ALT 20   Lipid Panel    Component Value Date/Time   CHOL 127 03/19/2017 0747   TRIG 97 03/19/2017 0747   HDL 34 (L) 03/19/2017 0747   CHOLHDL 3.7 03/19/2017 0747   CHOLHDL 3.9 09/08/2015 0734   VLDL 28 09/08/2015 0734   LDLCALC  74 03/19/2017 0747    Additional studies/ records that were reviewed today include:      ASSESSMENT:    1. Coronary artery disease involving native coronary artery of native heart without angina pectoris   2. Essential hypertension   3. Mixed hyperlipidemia      PLAN:  In order of problems listed above:  CAD with remote cardiac catheterization showing CTO of the RCA with left to right collaterals managed medically-doing well without any angina.  Walking 3 to 5 miles daily.  Has lost 30 pounds.  Up with Dr. Irish Lack in 1 year.  Essential hypertension blood pressure well controlled  Hyperlipidemia LDL 74 03/2017 on pravastatin and fish oil.  Due for fasting lipid panel in March.    Medication Adjustments/Labs and Tests Ordered: Current medicines are reviewed at length with the patient today.  Concerns regarding medicines are outlined above.  Medication changes, Labs and Tests ordered today are listed in the Patient Instructions below. Patient Instructions  Medication Instructions:  Your physician recommends that you continue on your current medications as directed. Please refer to the Current Medication list given to you today.  If you need a refill on your cardiac medications before your next appointment, please call your pharmacy.   Lab work: None Ordered  If you have labs (blood work) drawn today and your tests are completely normal, you will receive your results only by: Marland Kitchen MyChart Message (if you have MyChart) OR . A paper copy in the mail If you have any lab test that is abnormal or we need to change your treatment, we will call you to review the results.  Testing/Procedures: None ordered  Follow-Up: At [facility], you and your health needs are our priority.  As part of our continuing mission to provide you with exceptional heart care, we have created designated Provider Care Teams.  These Care Teams include your primary Cardiologist (physician) and Advanced  Practice Providers (APPs -  Physician Assistants and  Nurse Practitioners) who all work together to provide you with the care you need, when you need it. . You will need a follow up appointment in 1 year.  Please call our office 2 months in advance to schedule this appointment.  You may see Casandra Doffing, MD or one of the following Advanced Practice Providers on your designated Care Team:   . Lyda Jester, PA-C . Dayna Dunn, PA-C . Ermalinda Barrios, PA-C  Any Other Special Instructions Will Be Listed Below (If Applicable).       Sumner Boast, PA-C  11/25/2017 11:21 AM    [facility] [address], [place], [place]  [address] Phone: [phone]; Fax: [phone]

## 2017-11-25 ENCOUNTER — Encounter: Payer: Self-pay | Admitting: Physician Assistant

## 2017-11-25 ENCOUNTER — Ambulatory Visit: Payer: Medicare Other | Admitting: Physician Assistant

## 2017-11-25 VITALS — BP 130/84 | HR 63 | Ht 72.0 in | Wt 212.8 lb

## 2017-11-25 DIAGNOSIS — I2510 Atherosclerotic heart disease of native coronary artery without angina pectoris: Secondary | ICD-10-CM

## 2017-11-25 DIAGNOSIS — I10 Essential (primary) hypertension: Secondary | ICD-10-CM | POA: Diagnosis not present

## 2017-11-25 DIAGNOSIS — E782 Mixed hyperlipidemia: Secondary | ICD-10-CM | POA: Diagnosis not present

## 2017-11-25 NOTE — Patient Instructions (Signed)

## 2017-12-09 DIAGNOSIS — H40013 Open angle with borderline findings, low risk, bilateral: Secondary | ICD-10-CM | POA: Diagnosis not present

## 2017-12-09 DIAGNOSIS — H353133 Nonexudative age-related macular degeneration, bilateral, advanced atrophic without subfoveal involvement: Secondary | ICD-10-CM | POA: Diagnosis not present

## 2017-12-09 DIAGNOSIS — H26493 Other secondary cataract, bilateral: Secondary | ICD-10-CM | POA: Diagnosis not present

## 2017-12-09 DIAGNOSIS — H43812 Vitreous degeneration, left eye: Secondary | ICD-10-CM | POA: Diagnosis not present

## 2018-01-12 DIAGNOSIS — H26491 Other secondary cataract, right eye: Secondary | ICD-10-CM | POA: Diagnosis not present

## 2018-01-22 DIAGNOSIS — I25119 Atherosclerotic heart disease of native coronary artery with unspecified angina pectoris: Secondary | ICD-10-CM | POA: Diagnosis not present

## 2018-01-22 DIAGNOSIS — Z0000 Encounter for general adult medical examination without abnormal findings: Secondary | ICD-10-CM | POA: Diagnosis not present

## 2018-01-22 DIAGNOSIS — I10 Essential (primary) hypertension: Secondary | ICD-10-CM | POA: Diagnosis not present

## 2018-01-22 DIAGNOSIS — G500 Trigeminal neuralgia: Secondary | ICD-10-CM | POA: Diagnosis not present

## 2018-03-10 ENCOUNTER — Other Ambulatory Visit: Payer: Self-pay | Admitting: Interventional Cardiology

## 2018-04-10 ENCOUNTER — Other Ambulatory Visit: Payer: Self-pay | Admitting: *Deleted

## 2018-04-10 ENCOUNTER — Other Ambulatory Visit: Payer: Self-pay | Admitting: Interventional Cardiology

## 2018-04-10 MED ORDER — METOPROLOL SUCCINATE ER 50 MG PO TB24: 50.0000 mg | ORAL_TABLET | Freq: Every day | ORAL | 0 refills | Status: DC

## 2018-05-08 ENCOUNTER — Other Ambulatory Visit: Payer: Self-pay | Admitting: Neurology

## 2018-07-23 ENCOUNTER — Other Ambulatory Visit: Payer: Self-pay | Admitting: Interventional Cardiology

## 2018-08-27 DIAGNOSIS — H16142 Punctate keratitis, left eye: Secondary | ICD-10-CM | POA: Diagnosis not present

## 2018-08-27 DIAGNOSIS — H532 Diplopia: Secondary | ICD-10-CM | POA: Diagnosis not present

## 2018-09-14 ENCOUNTER — Other Ambulatory Visit: Payer: Self-pay | Admitting: Interventional Cardiology

## 2018-09-14 MED ORDER — LISINOPRIL 20 MG PO TABS: 20.0000 mg | ORAL_TABLET | Freq: Every day | ORAL | 0 refills | Status: DC

## 2018-09-14 MED ORDER — ATORVASTATIN CALCIUM 40 MG PO TABS: 40.0000 mg | ORAL_TABLET | Freq: Every day | ORAL | 0 refills | Status: DC

## 2018-09-14 NOTE — Telephone Encounter (Signed)
Pt's medications were sent to pt's pharmacy as requested. Confirmation received.  

## 2018-10-01 DIAGNOSIS — H40013 Open angle with borderline findings, low risk, bilateral: Secondary | ICD-10-CM | POA: Diagnosis not present

## 2018-10-01 DIAGNOSIS — H524 Presbyopia: Secondary | ICD-10-CM | POA: Diagnosis not present

## 2018-10-01 DIAGNOSIS — Z961 Presence of intraocular lens: Secondary | ICD-10-CM | POA: Diagnosis not present

## 2018-10-01 DIAGNOSIS — H2640 Unspecified secondary cataract: Secondary | ICD-10-CM | POA: Diagnosis not present

## 2018-10-21 ENCOUNTER — Telehealth: Payer: Self-pay

## 2018-10-21 MED ORDER — METOPROLOL SUCCINATE ER 50 MG PO TB24: 50.0000 mg | ORAL_TABLET | Freq: Every day | ORAL | 0 refills | Status: DC

## 2018-10-21 NOTE — Telephone Encounter (Signed)
RX sent

## 2018-11-23 DIAGNOSIS — R972 Elevated prostate specific antigen [PSA]: Secondary | ICD-10-CM | POA: Diagnosis not present

## 2018-11-25 NOTE — Progress Notes (Deleted)
Cardiology Office Note   Date:  11/25/2018   ID:  Billy Knox, DOB [birth year], MRN [id number]  PCP:  Hulan Fess, MD    No chief complaint on file.  CAD  Wt Readings from Last 3 Encounters:  11/25/17 212 lb 12.8 oz (96.5 kg)  10/01/17 207 lb (93.9 kg)  07/27/17 202 lb (91.6 kg)       History of Present Illness: Billy Knox is a 78 y.o. male   with history of CAD status post CTO of the RCA with left to right collaterals managed medically.  Had a syncopal episode after neurosurgery in 2018 felt secondary to overmedication with his neurological drugs.  Also has hypertension, HLD, obesity.  Last saw Dr. Irish Lack 09/2016 at which time he was doing well.  In 2019: "Has lost 30 lbs by cutting out starches and carbs. Walks 3-5 miles/day. Walked 1100 miles so far this year!  Also does ballroom dancing."    Past Medical History:  Diagnosis Date  . 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 Colon polyp   . Coronary artery disease   . Deafness in right ear   . Esophageal stricture   . GERD (gastroesophageal reflux disease)    past history no recent problems  . Hyperchloremia   . Hypertension   . Macular degeneration   . Myocardial infarction [facility]) 2007   hx. silent MI - no intervention except oral meds   . Ringing in ears    right  . Rt facial numbness   . 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 Sudden blockage of esophagus 07/2017  . Trigeminal neuralgia     Past Surgical History:  Procedure Laterality Date  . CYSTOSCOPY    . ESOPHAGOGASTRODUODENOSCOPY    . ESOPHAGOGASTRODUODENOSCOPY  06/14/2011   Procedure: ESOPHAGOGASTRODUODENOSCOPY (EGD);  Surgeon: Winfield Cunas., MD;  Location: [facility];  Service: Endoscopy;  Laterality: N/A;  . ESOPHAGOGASTRODUODENOSCOPY N/A 07/28/2017   Procedure: ESOPHAGOGASTRODUODENOSCOPY (EGD);  Surgeon: Laurence Spates, MD;  Location[facility];  Service: Endoscopy;  Laterality: N/A;  . ESOPHAGOGASTRODUODENOSCOPY (EGD) WITH PROPOFOL N/A 08/12/2014   Procedure: ESOPHAGOGASTRODUODENOSCOPY (EGD) WITH PROPOFOL;  Surgeon: Laurence Spates, MD;  [facility];  Service: Endoscopy;  Laterality: N/A;  . ESOPHAGOGASTRODUODENOSCOPY (EGD) WITH PROPOFOL N/A 10/29/2016   Procedure: ESOPHAGOGASTRODUODENOSCOPY (EGD) WITH PROPOFOL;  Surgeon: Laurence Spates, MD;  [facility];  Service: Endoscopy;  Laterality: N/A;  . FOREIGN BODY REMOVAL  07/28/2017   Procedure: FOREIGN BODY REMOVAL;  Surgeon: Laurence Spates, MD;  [facility];  Service: Endoscopy;;  . gamma knife     3 yrs ago [place] area around trigeminal nerve area"  . PROSTATE BIOPSY     x 3  . SAVORY DILATION  06/14/2011   Procedure: SAVORY DILATION;  Surgeon: Winfield Cunas., MD;  Location[facility];  Service: Endoscopy;  Laterality: N/A;  need xray  . SAVORY DILATION N/A 08/12/2014   Procedure: SAVORY DILATION;  Surgeon: Laurence Spates, MD;  [facility];  Service: Endoscopy;  Laterality: N/A;  . SAVORY DILATION N/A 10/29/2016   Procedure: SAVORY DILATION;  Surgeon: Laurence Spates, MD;  [facility];  Service: Endoscopy;  Laterality: N/A;  . TONSILLECTOMY      child  . TRIGEMINAL NERVE BALLOON DECOMPRESSION Right    right ear deafness x 4 total     Current  Outpatient Medications  Medication Sig Dispense Refill  . acetaminophen (TYLENOL) 500 MG tablet Take 1,000 mg by mouth every 6 (six) hours as needed for mild pain.    Marland Kitchen aspirin 81 MG tablet Take 81 mg by mouth daily.    Marland Kitchen atorvastatin (LIPITOR) 40 MG tablet Take 1 tablet (40 mg total) by mouth daily. 90 tablet 0  . finasteride (PROSCAR) 5 MG tablet Take 5 mg by mouth daily.    Marland Kitchen gabapentin (NEURONTIN) 300 MG capsule TAKE ONE CAPSULE BY MOUTH FOUR TIMES DAILY, EVERY 5 HOURS. GENERIC EQUIVALENT FOR NEURONTIN. 360 capsule 3  . Glucosamine HCl (GLUCOSAMINE PO) Take 1,200  mg by mouth daily.    Marland Kitchen lamoTRIgine (LAMICTAL) 100 MG tablet Take 1 tablet (100 mg total) by mouth 2 (two) times daily. 180 tablet 4  . lisinopril (ZESTRIL) 20 MG tablet Take 1 tablet (20 mg total) by mouth daily. 90 tablet 0  . metoprolol succinate (TOPROL-XL) 50 MG 24 hr tablet Take 1 tablet (50 mg total) by mouth daily. Take with or immediately following a meal. 90 tablet 0  . Multiple Vitamin (MULTIVITAMIN WITH MINERALS) TABS tablet Take 1 tablet by mouth daily.    . nitroGLYCERIN (NITROSTAT) 0.4 MG SL tablet Place 1 tablet (0.4 mg total) under the tongue every 5 (five) minutes as needed for chest pain. 25 tablet 6  . Omega-3 Fatty Acids (FISH OIL) 1200 MG CAPS Take 4,800 mg by mouth 2 (two) times daily.     . Omeprazole (PRILOSEC PO) Take by mouth daily.    . tamsulosin (FLOMAX) 0.4 MG CAPS capsule Take 1 capsule by mouth daily.  11   No current facility-administered medications for this visit.     Allergies:   Patient has no known allergies.    Social History:  The patient  reports that he quit smoking about 51 years ago. He has never used smokeless tobacco. He reports that he does not drink alcohol or use drugs.   Family History:  The patient's ***family history includes Alcohol abuse in his father; Colon cancer in his mother; Heart disease in his mother; Liver cancer in his sister.    ROS:  Please see the history of present illness.   Otherwise, review of systems are positive for ***.   All other systems are reviewed and negative.    PHYSICAL EXAM: VS:  There were no vitals taken for this visit. , BMI There is no height or weight on file to calculate BMI. GEN: Well nourished, well developed, in no acute distress HEENT: normal Neck: no JVD, carotid bruits, or masses Cardiac: ***RRR; no murmurs, rubs, or gallops,no edema  Respiratory:  clear to auscultation bilaterally, normal work of breathing GI: soft, nontender, nondistended, + BS MS: no deformity or atrophy Skin: warm and  dry, no rash Neuro:  Strength and sensation are intact Psych: euthymic mood, full affect   EKG:   The ekg ordered today demonstrates ***   Recent Labs: No results found for requested labs within last 8760 hours.   Lipid Panel    Component Value Date/Time   CHOL 127 03/19/2017 0747   TRIG 97 03/19/2017 0747   HDL 34 (L) 03/19/2017 0747   CHOLHDL 3.7 03/19/2017 0747   CHOLHDL 3.9 09/08/2015 0734   VLDL 28 09/08/2015 0734   LDLCALC 74 03/19/2017 0747     Other studies Reviewed: Additional studies/ records that were reviewed today with results demonstrating: ***.   ASSESSMENT AND PLAN:  1. CAD:  2. HTN: 3. Hyperlipidemia:   Current medicines are reviewed at length with the patient today.  The patient concerns regarding his medicines were addressed.  The following changes have been made:  No change***  Labs/ tests ordered today include: *** No orders of the defined types were placed in this encounter.   Recommend 150 minutes/week of aerobic exercise Low fat, low carb, high fiber diet recommended  Disposition:   FU in ***   Signed, Larae Grooms, MD  11/25/2018 10:27 PM    [facility] [place], [place], [place]  [address] Phone: [phone]; Fax: [phone]

## 2018-11-27 ENCOUNTER — Ambulatory Visit: Payer: Medicare Other | Admitting: Interventional Cardiology

## 2018-12-01 DIAGNOSIS — R972 Elevated prostate specific antigen [PSA]: Secondary | ICD-10-CM | POA: Diagnosis not present

## 2018-12-01 DIAGNOSIS — N400 Enlarged prostate without lower urinary tract symptoms: Secondary | ICD-10-CM | POA: Diagnosis not present

## 2018-12-08 ENCOUNTER — Other Ambulatory Visit: Payer: Self-pay | Admitting: Neurology

## 2018-12-08 DIAGNOSIS — G500 Trigeminal neuralgia: Secondary | ICD-10-CM

## 2018-12-30 ENCOUNTER — Other Ambulatory Visit: Payer: Self-pay | Admitting: Interventional Cardiology

## 2018-12-31 DIAGNOSIS — L600 Ingrowing nail: Secondary | ICD-10-CM | POA: Diagnosis not present

## 2018-12-31 DIAGNOSIS — M79672 Pain in left foot: Secondary | ICD-10-CM | POA: Diagnosis not present

## 2018-12-31 DIAGNOSIS — M79671 Pain in right foot: Secondary | ICD-10-CM | POA: Diagnosis not present

## 2019-01-20 DIAGNOSIS — L600 Ingrowing nail: Secondary | ICD-10-CM | POA: Diagnosis not present

## 2019-01-27 DIAGNOSIS — I25119 Atherosclerotic heart disease of native coronary artery with unspecified angina pectoris: Secondary | ICD-10-CM | POA: Diagnosis not present

## 2019-01-27 DIAGNOSIS — Z0000 Encounter for general adult medical examination without abnormal findings: Secondary | ICD-10-CM | POA: Diagnosis not present

## 2019-01-27 DIAGNOSIS — E782 Mixed hyperlipidemia: Secondary | ICD-10-CM | POA: Diagnosis not present

## 2019-01-27 DIAGNOSIS — I10 Essential (primary) hypertension: Secondary | ICD-10-CM | POA: Diagnosis not present

## 2019-01-28 ENCOUNTER — Encounter: Payer: Self-pay | Admitting: Interventional Cardiology

## 2019-01-28 ENCOUNTER — Ambulatory Visit (INDEPENDENT_AMBULATORY_CARE_PROVIDER_SITE_OTHER): Payer: Medicare Other | Admitting: Interventional Cardiology

## 2019-01-28 ENCOUNTER — Other Ambulatory Visit: Payer: Self-pay

## 2019-01-28 VITALS — BP 96/60 | HR 67 | Ht 72.0 in | Wt 216.6 lb

## 2019-01-28 DIAGNOSIS — I2510 Atherosclerotic heart disease of native coronary artery without angina pectoris: Secondary | ICD-10-CM

## 2019-01-28 DIAGNOSIS — I10 Essential (primary) hypertension: Secondary | ICD-10-CM | POA: Diagnosis not present

## 2019-01-28 DIAGNOSIS — E782 Mixed hyperlipidemia: Secondary | ICD-10-CM

## 2019-01-28 NOTE — Patient Instructions (Signed)

## 2019-01-28 NOTE — Progress Notes (Signed)
Cardiology Office Note   Date:  01/28/2019   ID:  ZAREK DACKO, DOB 02/07/1940, MRN [id number]  PCP:  Hulan Fess, MD    No chief complaint on file.  CAD  Wt Readings from Last 3 Encounters:  01/28/19 216 lb 9.6 oz (98.2 kg)  11/25/17 212 lb 12.8 oz (96.5 kg)  10/01/17 207 lb (93.9 kg)       History of Present Illness: Billy Knox is a 79 y.o. male  with CAD, CTO of RCA diagnosed many years ago, Left to right collaterals, medically managed. He has had gamma knife surgery for tic doloreux in 2013.  He has lost over 20 pounds in 2016. He gained some of the weight back due to dietary indiscretion.  He had An episode of chest pain in 12/16. He had esophageal dilatation with relief of sx. (Schatzki's ring).  In early 2018, he had a syncopal episode after neurosurgery in 11/17 (Nucleus Caudalis Dorsal root entry zone, for facial pain from trigeminal neuralgia. Dr. Leonia Corona.  He had a broken left eye socket. He had an extensive evaluation. He thinks he was overmedicated with his neurologic medications. Since reducing the dosages, he has not had any further syncope.  He felt "loopy" at that time.  THis has resolved.  Unfortunately, his pain is worse after the surgery.    In 2019: "lost 30 lbs by cutting out starches and carbs. Walks 3-5 miles/day. Walked 1100 miles so far this year!  Also does ballroom dancing. "  Since the last visit, he has done well.  He has kept most of the weight off.  In-grown toe nails have limited his walking of late.  Had surgery that went well on both feet.  Denies : Chest pain. Dizziness. Leg edema. Nitroglycerin use. Orthopnea. Palpitations. Paroxysmal nocturnal dyspnea. Shortness of breath. Syncope.      Past Medical History:  Diagnosis Date  . 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 Colon polyp   . Coronary artery disease   . Deafness in right ear   . Esophageal stricture   . GERD  (gastroesophageal reflux disease)    past history no recent problems  . Hyperchloremia   . Hypertension   . Macular degeneration   . Myocardial infarction [facility]) 2007   hx. silent MI - no intervention except oral meds   . Ringing in ears    right  . Rt facial numbness   . 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 Sudden blockage of esophagus 07/2017  . Trigeminal neuralgia     Past Surgical History:  Procedure Laterality Date  . CYSTOSCOPY    . ESOPHAGOGASTRODUODENOSCOPY    . ESOPHAGOGASTRODUODENOSCOPY  06/14/2011   Procedure: ESOPHAGOGASTRODUODENOSCOPY (EGD);  Surgeon: W[place] Cunas., MD;  Location: [facility];  Service: Endoscopy;  Laterality: N/A;  . ESOPHAGOGASTRODUODENOSCOPY N/A 07/28/2017   Procedure: ESOPHAGOGASTRODUODENOSCOPY (EGD);  Surgeon: Laurence Spates, MD;  Location: [facility];  Service: Endoscopy;  Laterality: N/A;  . ESOPHAGOGASTRODUODENOSCOPY (EGD) WITH PROPOFOL N/A 08/12/2014   Procedure: ESOPHAGOGASTRODUODENOSCOPY (EGD) WITH PROPOFOL;  Surgeon: Laurence Spates, MD;  Location: [facility];  Service: Endoscopy;  Laterality: N/A;  . ESOPHAGOGASTRODUODENOSCOPY (EGD) WITH PROPOFOL N/A 10/29/2016   Procedure: ESOPHAGOGASTRODUODENOSCOPY (EGD) WITH PROPOFOL;  Surgeon: Laurence Spates, MD;  Location: [facility];  Service: Endoscopy;  Laterality: N/A;  . FOREIGN BODY REMOVAL  07/28/2017   Procedure:  FOREIGN BODY REMOVAL;  Surgeon: Laurence Spates, MD;  Location: [facility];  Service: Endoscopy;;  . gamma knife     3 yrs ago [place] area around trigeminal nerve area"  . PROSTATE BIOPSY     x 3  . SAVORY DILATION  06/14/2011   Procedure: SAVORY DILATION;  Surgeon: [place] Cunas., MD;  Location: [facility];  Service: Endoscopy;  Laterality: N/A;  need xray  . SAVORY DILATION N/A 08/12/2014   Procedure: SAVORY DILATION;  Surgeon: Laurence Spates, MD;  Location: [facility];  Service: Endoscopy;  Laterality: N/A;    . SAVORY DILATION N/A 10/29/2016   Procedure: SAVORY DILATION;  Surgeon: Laurence Spates, MD;  Location: [facility];  Service: Endoscopy;  Laterality: N/A;  . TONSILLECTOMY      child  . TRIGEMINAL NERVE BALLOON DECOMPRESSION Right    right ear deafness x 4 total     Current Outpatient Medications  Medication Sig Dispense Refill  . acetaminophen (TYLENOL) 500 MG tablet Take 1,000 mg by mouth every 6 (six) hours as needed for mild pain.    Marland Kitchen aspirin 81 MG tablet Take 81 mg by mouth daily.    Marland Kitchen atorvastatin (LIPITOR) 80 MG tablet TK 1 T PO  ONCE A DAY NEW DOSE    . finasteride (PROSCAR) 5 MG tablet Take 5 mg by mouth daily.    Marland Kitchen gabapentin (NEURONTIN) 300 MG capsule TAKE ONE CAPSULE BY MOUTH FOUR TIMES DAILY, EVERY 5 HOURS. GENERIC EQUIVALENT FOR NEURONTIN. 360 capsule 3  . Glucosamine HCl (GLUCOSAMINE PO) Take 1,200 mg by mouth daily.    Marland Kitchen lamoTRIgine (LAMICTAL) 100 MG tablet Take 1 tablet (100 mg total) by mouth 2 (two) times daily. 180 tablet 4  . lisinopril (ZESTRIL) 20 MG tablet Take 1 tablet by mouth once daily 90 tablet 0  . metoprolol succinate (TOPROL-XL) 50 MG 24 hr tablet Take 1 tablet (50 mg total) by mouth daily. Take with or immediately following a meal. 90 tablet 0  . Multiple Vitamin (MULTIVITAMIN WITH MINERALS) TABS tablet Take 1 tablet by mouth daily.    . nitroGLYCERIN (NITROSTAT) 0.4 MG SL tablet Place 1 tablet (0.4 mg total) under the tongue every 5 (five) minutes as needed for chest pain. 25 tablet 6  . Omega-3 Fatty Acids (FISH OIL) 1200 MG CAPS Take 4,800 mg by mouth 2 (two) times daily.     . Omeprazole (PRILOSEC PO) Take by mouth daily.    . tamsulosin (FLOMAX) 0.4 MG CAPS capsule Take 1 capsule by mouth daily.  11   No current facility-administered medications for this visit.    Allergies:   Patient has no known allergies.    Social History:  The patient  reports that he quit smoking about 51 years ago. He has never used smokeless tobacco. He reports that  he does not drink alcohol or use drugs.   Family History:  The patient's family history includes Alcohol abuse in his father; Colon cancer in his mother; Heart disease in his mother; Liver cancer in his sister.    ROS:  Please see the history of present illness.   Otherwise, review of systems are positive for toe nails.   All other systems are reviewed and negative.    PHYSICAL EXAM: VS:  BP 96/60   Pulse 67   Ht 6' (1.829 m)   Wt 216 lb 9.6 oz (98.2 kg)   SpO2 97%   BMI 29.38 kg/m  , BMI Body mass index is 29.38  kg/m. GEN: Well nourished, well developed, in no acute distress  HEENT: normal  Neck: no JVD, carotid bruits, or masses Cardiac: RRR; no murmurs, rubs, or gallops,no edema  Respiratory:  clear to auscultation bilaterally, normal work of breathing GI: soft, nontender, nondistended, + BS MS: no deformity or atrophy  Skin: warm and dry, no rash Neuro:  Strength and sensation are intact Psych: euthymic mood, full affect   EKG:   The ekg ordered today demonstrates NSR, inferior Q waves, no ST changes   Recent Labs: No results found for requested labs within last 8760 hours.   Lipid Panel    Component Value Date/Time   CHOL 127 03/19/2017 0747   TRIG 97 03/19/2017 0747   HDL 34 (L) 03/19/2017 0747   CHOLHDL 3.7 03/19/2017 0747   CHOLHDL 3.9 09/08/2015 0734   VLDL 28 09/08/2015 0734   LDLCALC 74 03/19/2017 0747     Other studies Reviewed: Additional studies/ records that were reviewed today with results demonstrating: labs reviewed.   ASSESSMENT AND PLAN:  1. CAD: No angina. COntinue aggressive secondary prevention.  Known CTO of the RCA. 2. HTN: The current medical regimen is effective;  continue present plan and medications. 3. Hyperlipidemia: LDL 86. 4. Continue regular activity to maintain his significant weight loss.  We discussed healthy diet.  5. I recommended COVID vaccine.  He will try the [place] site tomorrow.    Current medicines are  reviewed at length with the patient today.  The patient concerns regarding his medicines were addressed.  The following changes have been made:  No change  Labs/ tests ordered today include:  No orders of the defined types were placed in this encounter.   Recommend 150 minutes/week of aerobic exercise Low fat, low carb, high fiber diet recommended  Disposition:   FU in 1 year   Signed, Larae Grooms, MD  01/28/2019 3:54 PM    [facility] [address], [place], [place]  [address] Phone: [phone]; Fax: [phone]

## 2019-02-03 ENCOUNTER — Telehealth: Payer: Self-pay | Admitting: Neurology

## 2019-02-03 DIAGNOSIS — M79675 Pain in left toe(s): Secondary | ICD-10-CM | POA: Diagnosis not present

## 2019-02-03 DIAGNOSIS — B351 Tinea unguium: Secondary | ICD-10-CM | POA: Diagnosis not present

## 2019-02-03 DIAGNOSIS — L600 Ingrowing nail: Secondary | ICD-10-CM | POA: Diagnosis not present

## 2019-02-03 DIAGNOSIS — M79674 Pain in right toe(s): Secondary | ICD-10-CM | POA: Diagnosis not present

## 2019-02-03 MED ORDER — GABAPENTIN 300 MG PO CAPS: ORAL_CAPSULE | ORAL | 0 refills | Status: DC

## 2019-02-03 NOTE — Telephone Encounter (Signed)
Spoke with pt. He has about 1/2 a bottle left of the Gabapentin. He is no longer using [facility] mail order pharmacy. He prefers getting the medication locally as it's quicker. He verbalized appreciation for the call.

## 2019-02-03 NOTE — Telephone Encounter (Signed)
Pt is requesting a refill of gabapentin (NEURONTIN) 300 MG capsule , to be sent to [place], [place]

## 2019-02-03 NOTE — Addendum Note (Signed)
Addended by: Gildardo Griffes on: 02/03/2019 04:03 PM   Modules accepted: Orders

## 2019-02-10 DIAGNOSIS — R1310 Dysphagia, unspecified: Secondary | ICD-10-CM | POA: Diagnosis not present

## 2019-02-10 DIAGNOSIS — K219 Gastro-esophageal reflux disease without esophagitis: Secondary | ICD-10-CM | POA: Diagnosis not present

## 2019-02-10 DIAGNOSIS — K5900 Constipation, unspecified: Secondary | ICD-10-CM | POA: Diagnosis not present

## 2019-02-10 DIAGNOSIS — M533 Sacrococcygeal disorders, not elsewhere classified: Secondary | ICD-10-CM | POA: Diagnosis not present

## 2019-02-11 DIAGNOSIS — M533 Sacrococcygeal disorders, not elsewhere classified: Secondary | ICD-10-CM | POA: Diagnosis not present

## 2019-02-13 ENCOUNTER — Other Ambulatory Visit: Payer: Self-pay | Admitting: Interventional Cardiology

## 2019-02-25 ENCOUNTER — Ambulatory Visit: Payer: Medicare Other | Attending: Internal Medicine

## 2019-02-25 DIAGNOSIS — Z23 Encounter for immunization: Secondary | ICD-10-CM

## 2019-02-25 NOTE — Progress Notes (Signed)
   Covid-19 Vaccination Clinic  Name:  Billy Knox    MRN: [id number] DOB: 04-Apr-1940  02/25/2019  [name] was observed post Covid-19 immunization for 15 minutes without incidence. He was provided with Vaccine Information Sheet and instruction to access the V-Safe system.   [name] was instructed to call 911 with any severe reactions post vaccine: Marland Kitchen Difficulty breathing  . Swelling of your face and throat  . A fast heartbeat  . A bad rash all over your body  . Dizziness and weakness    Immunizations Administered    Name Date Dose VIS Date Route   Pfizer COVID-19 Vaccine 02/25/2019 10:38 AM 0.3 mL 12/25/2018 Intramuscular   Manufacturer: [place]   Lot: [id number]   [place]: [id number]

## 2019-03-03 DIAGNOSIS — M79674 Pain in right toe(s): Secondary | ICD-10-CM | POA: Diagnosis not present

## 2019-03-03 DIAGNOSIS — M79675 Pain in left toe(s): Secondary | ICD-10-CM | POA: Diagnosis not present

## 2019-03-03 DIAGNOSIS — L600 Ingrowing nail: Secondary | ICD-10-CM | POA: Diagnosis not present

## 2019-03-06 ENCOUNTER — Other Ambulatory Visit: Payer: Self-pay | Admitting: Interventional Cardiology

## 2019-03-08 ENCOUNTER — Encounter: Payer: Self-pay | Admitting: Neurology

## 2019-03-08 ENCOUNTER — Other Ambulatory Visit: Payer: Self-pay

## 2019-03-08 ENCOUNTER — Encounter: Payer: Self-pay | Admitting: Interventional Cardiology

## 2019-03-08 ENCOUNTER — Ambulatory Visit (INDEPENDENT_AMBULATORY_CARE_PROVIDER_SITE_OTHER): Payer: Medicare Other | Admitting: Neurology

## 2019-03-08 DIAGNOSIS — M5481 Occipital neuralgia: Secondary | ICD-10-CM

## 2019-03-08 DIAGNOSIS — M792 Neuralgia and neuritis, unspecified: Secondary | ICD-10-CM

## 2019-03-08 DIAGNOSIS — G500 Trigeminal neuralgia: Secondary | ICD-10-CM | POA: Diagnosis not present

## 2019-03-08 MED ORDER — GABAPENTIN (ONCE-DAILY) 600 MG PO TABS: 1200.0000 mg | ORAL_TABLET | Freq: Every evening | ORAL | 6 refills | Status: DC

## 2019-03-08 MED ORDER — GABAPENTIN 400 MG PO CAPS: ORAL_CAPSULE | ORAL | 4 refills | Status: DC

## 2019-03-08 MED ORDER — ATORVASTATIN CALCIUM 80 MG PO TABS: 80.0000 mg | ORAL_TABLET | Freq: Every day | ORAL | 3 refills | Status: DC

## 2019-03-08 NOTE — Telephone Encounter (Signed)
error 

## 2019-03-08 NOTE — Progress Notes (Signed)
[facility]    Provider:  Dr Jaynee Eagles Referring Provider: Hulan Fess, MD Primary Care Physician:  Hulan Fess, MD   CC:  Trigeminal Neuralgia  Virtual Visit via Telephone Note  I connected with [name] on 03/08/19 at  2:00 PM EST by telephone and verified that I am speaking with the correct person using two identifiers.  Location: Patient: Home Provider: office   I discussed the limitations, risks, security and privacy concerns of performing an evaluation and management service by telephone and the availability of in person appointments. I also discussed with the patient that there may be a patient responsible charge related to this service. The patient expressed understanding and agreed to proceed.   Follow Up Instructions:    I discussed the assessment and treatment plan with the patient. The patient was provided an opportunity to ask questions and all were answered. The patient agreed with the plan and demonstrated an understanding of the instructions.   The patient was advised to call back or seek an in-person evaluation if the symptoms worsen or if the condition fails to improve as anticipated.  I provided 25 minutes of non-face-to-face time during this encounter.   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  In the past he had done well with 300 mg gabapentin 4-5 times a day but if he misses a dose or is delayed the pain comes back, he not only has trigeminal neuralgia but he also has neuropathic pain from surgery.  We have tried multiple medications, gabapentin, Lamictal, at this time we will try to increase his gabapentin but he has been  having a hard time tolerating it not sure he can tolerate higher IR, we will also try to get gabapentin extended release.  Interval history 10/02/2017: He is here for follow up of Trigeminal Neuralgia. Extensive history of this including surgeries and failure of multiple meds including botox. He is on Gabapentin and Lamictal. He says he feels worse with increased area of pain on the right in the trigeminal area, and in the incision site. If he is having a bad day he takes Gabapentin. Still having ififculty touching face, especially shaving. He has lost more taste.  He also feels he has lost smell and taste. He denies any tremors of the hands or falls. He has a slight head tremor that he notices nothing significant. He walking well, no falls. Discussed he is not parkinsonian on exam. Otherwise for TGN, the medicine is working, if he misses the pain gets worse. Feels pain is "under control" currently. Discussed increased Gabapentin can take it every 3 hours as needed, may increase Lamictal as well. Discussed. He has ben to [facility] and multiple other institutions. Injecting alcohol into the nerve helped once.   Interval history 09/24/2016: botulinum toxin in the right trigeminal area did not help. He has continued pain in the are of the scar and now getting more pain in the occipital area, sharp, burning since the surgery suspect injury to the occipital nerve. He takes a 300mg  gabapentin every 5.5 hours which helps. He has not tried Lamictal, baclofen, topiramate, valproate. Will try Lamictal  HPI:  Billy Knox is a 79 y.o. male here as  a referral from Dr. Rex Kras for trigeminal neuralgia. 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 He is status post gamma knife radiosurgical rhizotomy in 2012 and recent dorsal root entry zone surgery. He has had 4 surgeries as far back as the 90s.  Started 25 years ago first surgery was in 1992. He has seen in December at [facility] correct and his neurontin was increased and he had a fall. He lost feeling in his left lip after recent fall (see CT below). Yesterday he had pain like a hot poker through the scalp (Right high parietal lobe) and now pain around the right side of the ear. Since the DREZ he still gets flashes of pain in the right side of the face. Feels like someone has hit him all the time. Throbbing and pounding. With light and sound sensitivity and nausea. No medication overuse. He says the trigeminal nerve pain shoots into all three zones (v1,v2,v3). When he first got the trigeminal neuralgia food would trigger it. He has also been to the head of Neurology at [facility] as well as [facility]. Unknown triggers, sporadic, sometimes touching his face with cause the pain, turning the head, wind and chewing. Lightning, severe, brief. He has an aching pain on the right which is constant. Like a pressure. He has daily headaches and half of them a month are migrainous (unilateral right side of head, light, sound sensitivity, pounding, throbbing, nausea) ongoing at this frequency for years. No aura. No medication overuse. He also has neck pain and stiff muscles on the right with decreased ROM, slowly progressive, cannot use muscle relaxers due to side effects and sedation and risk of falls, has tried massage and PT without help, pain and decreased ROM.   Meds tried include: Oxcarbazepine, Vimpat, gabapentin, clonazepam, tiagabine, oxycodone, flexeril, tramadol, naltrexone, Tegretol(did not help).   Reviewed notes, labs and imaging from outside physicians, which showed:   CBC showed elevated white blood cells 3 weeks ago [id number] with neutrophilic predominance otherwise normal. CMP showed normal sodium, chloride 100, glucose 108, total protein 6 otherwise unremarkable.  Personally reviewed images CT of the head and agree with the  following:  IMPRESSION: CT head 02/26/2016 (in ED for a fall): Atrophy with supratentorial small vessel disease. No intracranial mass hemorrhage, or extra-axial fluid collection. No acute infarct evident. Previous surgical removal a portion of the lateral right occipital bone. Areas of arterial vascular calcification noted.  Primary diagnosis patient is suffering from trigeminal neuralgia for several decades. He was operated with microvascular decompression several times also treated with gamma knife without much effect. He recently stopped oxycodone. He was recently treated with DREZ by Dr. Mearl Latin and is complaining about postprocedural resurgence of neuralgic pain in V2 and also retro-orbital pain always on the right side. Diagnosed with postprocedural flare up. They added low-dose naltrexone increased gabapentin and also added Vimpat. Repeating his neuro imaging was suggested.   Review of Systems: Patient complains of symptoms per HPI as well as the following symptoms: No CP, no SOB. Memory loss, joint pain, back pain, facial pin, head pain, burning pain Pertinent negatives per HPI. All others negative.  Social History   Socioeconomic History  . Marital status: Married    Spouse name: Not on file  . Number of children: 1  . Years of education: 47  . Highest education level: Not on file  Occupational History  . Occupation: Retired  Tobacco Use  .  Smoking status: Former Smoker    Quit date: 06/14/1967    Years since quitting: 51.7  . Smokeless tobacco: Never Used  Substance and Sexual Activity  . Alcohol use: No    Alcohol/week: 1.0 standard drinks    Types: 1 Standard drinks or equivalent per week  . Drug use: No  . Sexual activity: Not on file  Other Topics Concern  . Not on file  Social History Narrative   Lives at home w/ his wife   Right-handed   Caffeine: 2 cups per day   Social Determinants of Health   Financial Resource Strain:   . Difficulty of Paying Living  Expenses: Not on file  Food Insecurity:   . Worried About Charity fundraiser in the Last Year: Not on file  . Ran Out of Food in the Last Year: Not on file  Transportation Needs:   . Lack of Transportation (Medical): Not on file  . Lack of Transportation (Non-Medical): Not on file  Physical Activity:   . Days of Exercise per Week: Not on file  . Minutes of Exercise per Session: Not on file  Stress:   . Feeling of Stress : Not on file  Social Connections:   . Frequency of Communication with Friends and Family: Not on file  . Frequency of Social Gatherings with Friends and Family: Not on file  . Attends Religious Services: Not on file  . Active Member of Clubs or Organizations: Not on file  . Attends Archivist Meetings: Not on file  . Marital Status: Not on file  Intimate Partner Violence:   . Fear of Current or Ex-Partner: Not on file  . Emotionally Abused: Not on file  . Physically Abused: Not on file  . Sexually Abused: Not on file    Family History  Problem Relation Age of Onset  . Colon cancer Mother   . Heart disease Mother   . Liver cancer Sister   . Alcohol abuse Father     Past Medical History:  Diagnosis Date  . 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 Colon polyp   . Coronary artery disease   . Deafness in right ear   . Esophageal stricture   . GERD (gastroesophageal reflux disease)    past history no recent problems  . Hyperchloremia   . Hypertension   . Macular degeneration   . Myocardial infarction [facility]) 2007   hx. silent MI - no intervention except oral meds   . Ringing in ears    right  . Rt facial numbness   . 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 Sudden blockage of esophagus 07/2017  . Trigeminal neuralgia     Past Surgical History:  Procedure Laterality Date  . CYSTOSCOPY    . ESOPHAGOGASTRODUODENOSCOPY    . ESOPHAGOGASTRODUODENOSCOPY  06/14/2011    Procedure: ESOPHAGOGASTRODUODENOSCOPY (EGD);  Surgeon: Winfield Cunas., MD;  Location: [facility];  Service: Endoscopy;  Laterality: N/A;  . ESOPHAGOGASTRODUODENOSCOPY N/A 07/28/2017   Procedure: ESOPHAGOGASTRODUODENOSCOPY (EGD);  Surgeon: Laurence Spates, MD;  Location: [facility];  Service: Endoscopy;  Laterality: N/A;  . ESOPHAGOGASTRODUODENOSCOPY (EGD) WITH PROPOFOL N/A 08/12/2014   Procedure: ESOPHAGOGASTRODUODENOSCOPY (EGD) WITH PROPOFOL;  Surgeon: Laurence Spates, MD;  Location: [facility];  Service: Endoscopy;  Laterality: N/A;  . ESOPHAGOGASTRODUODENOSCOPY (EGD) WITH PROPOFOL N/A 10/29/2016   Procedure: ESOPHAGOGASTRODUODENOSCOPY (EGD) WITH PROPOFOL;  Surgeon:  Laurence Spates, MD;  Location: [facility];  Service: Endoscopy;  Laterality: N/A;  . FOREIGN BODY REMOVAL  07/28/2017   Procedure: FOREIGN BODY REMOVAL;  Surgeon: Laurence Spates, MD;  Location: [facility];  Service: Endoscopy;;  . gamma knife     3 yrs ago [place] area around trigeminal nerve area"  . PROSTATE BIOPSY     x 3  . SAVORY DILATION  06/14/2011   Procedure: SAVORY DILATION;  Surgeon: Winfield Cunas., MD;  Location: [facility];  Service: Endoscopy;  Laterality: N/A;  need xray  . SAVORY DILATION N/A 08/12/2014   Procedure: SAVORY DILATION;  Surgeon: Laurence Spates, MD;  Location: [facility];  Service: Endoscopy;  Laterality: N/A;  . SAVORY DILATION N/A 10/29/2016   Procedure: SAVORY DILATION;  Surgeon: Laurence Spates, MD;  Location: [facility];  Service: Endoscopy;  Laterality: N/A;  . TONSILLECTOMY      child  . TRIGEMINAL NERVE BALLOON DECOMPRESSION Right    right ear deafness x 4 total    Current Outpatient Medications  Medication Sig Dispense Refill  . acetaminophen (TYLENOL) 500 MG tablet Take 1,000 mg by mouth every 6 (six) hours as needed for mild pain.    Marland Kitchen aspirin 81 MG tablet Take 81 mg by mouth daily.    Marland Kitchen atorvastatin (LIPITOR) 80 MG tablet Take 1 tablet (80 mg total) by mouth  daily at 6 PM. 90 tablet 3  . finasteride (PROSCAR) 5 MG tablet Take 5 mg by mouth daily.    Marland Kitchen gabapentin (NEURONTIN) 400 MG capsule TAKE ONE CAPSULE BY MOUTH FOUR TIMES DAILY, EVERY 5 HOURS. GENERIC EQUIVALENT FOR NEURONTIN. 360 capsule 4  . Gabapentin, Once-Daily, 600 MG TABS Take 1,200 mg by mouth every evening. TAKE WITH DINNER. Must take with food for this to be effective. 60 tablet 6  . Glucosamine HCl (GLUCOSAMINE PO) Take 1,200 mg by mouth daily.    Marland Kitchen lamoTRIgine (LAMICTAL) 100 MG tablet Take 1 tablet (100 mg total) by mouth 2 (two) times daily. 180 tablet 4  . lisinopril (ZESTRIL) 20 MG tablet Take 1 tablet by mouth once daily 90 tablet 0  . metoprolol succinate (TOPROL-XL) 50 MG 24 hr tablet TAKE 1 TABLET BY MOUTH ONCE DAILY WITH OR IMMEDIATELY FOLLOWING A MEAL 90 tablet 3  . Multiple Vitamin (MULTIVITAMIN WITH MINERALS) TABS tablet Take 1 tablet by mouth daily.    . nitroGLYCERIN (NITROSTAT) 0.4 MG SL tablet Place 1 tablet (0.4 mg total) under the tongue every 5 (five) minutes as needed for chest pain. 25 tablet 6  . Omega-3 Fatty Acids (FISH OIL) 1200 MG CAPS Take 4,800 mg by mouth 2 (two) times daily.     . Omeprazole (PRILOSEC PO) Take by mouth daily.    . tamsulosin (FLOMAX) 0.4 MG CAPS capsule Take 1 capsule by mouth daily.  11   No current facility-administered medications for this visit.    Allergies as of 03/08/2019  . (No Known Allergies)    Vitals: There were no vitals taken for this visit. Last Weight:  Wt Readings from Last 1 Encounters:  01/28/19 216 lb 9.6 oz (98.2 kg)   Last Height:   Ht Readings from Last 1 Encounters:  01/28/19 6' (1.829 m)   TELEPHONE VISIT  Assessment/Plan: Lovely 79 year old male here with 25+ years of trigeminal neuralgia and 4 surgeries, been to [facility], [facility], also migraines,  failed multiple medications. Failed botulinum toxin. He is a really unfortunate patient here for follow-up of trigeminal  neuralgia and neuropathic pain due  to trauma of the occipital area where he had surgery, extensive history of this including surgeries, failure of multiple medications including Botox, he is on gabapentin and Lamictal(failed vimpat, oxy, could not toleate lyrica, failed dilantin and baclofen, Oxcarbazepine, Vimpat, gabapentin, clonazepam, tiagabine, oxycodone, flexeril, tramadol, naltrexone, Tegretol(did not help), and many others, also botox did not help).  In the past he had done well with 300 mg gabapentin 4-5 times a day but if he misses a dose or is delayed the pain comes back, he not only has trigeminal neuralgia but he also has neuropathic pain from surgery, in the past could not tolerate higher doses of gabapentin had a fall.  We have tried multiple medications, gabapentin, Lamictal, at this time we will try to increase his gabapentin but he has been having a hard time tolerating it not sure he can tolerate, we will also try to get gabapentin extended release.  Hx:  He is status post gamma knife radiosurgical rhizotomy in 2012 and also dorsal root entry zone surgery. He has had 4 surgeries as far back as the 90s. Started 25 years ago first surgery was in 1992. He has seen in December 2018 at [facility]. Increased neurontin caused a fall.  He says he has some loss of smell and taste and constipation. He is not parkinsonian on exam. Occ head tremor may be essential tremor but not c/w PD.   Botox injections were not for patient. He continues to have significant trigeminal neuralgia and musculoskeletal neck pain and occipital neuritis from injury (post surgery) with reduced range of motion as well as neuropathy of the right occipital nerve likely due to surgical incision and scarring. Patient is refractory. He has tried multiple medications.   Can increase Lamictal next if needed for the TGN. Will try to get Gabapentin ER approved. 1200mg . Discussed fall precautions and to stop GPN if any side effects.   Refill meds. Monitor yearly for  progression or TGN or progression to PD.  Meds ordered this encounter  Medications  . gabapentin (NEURONTIN) 400 MG capsule    Sig: TAKE ONE CAPSULE BY MOUTH FOUR TIMES DAILY, EVERY 5 HOURS. GENERIC EQUIVALENT FOR NEURONTIN.    Dispense:  360 capsule    Refill:  4    Please place refill on file if too early to fill per insurance.  . Gabapentin, Once-Daily, 600 MG TABS    Sig: Take 1,200 mg by mouth every evening. TAKE WITH DINNER. Must take with food for this to be effective.    Dispense:  60 tablet    Refill:  6    Patient will continue Gabapentin IR until we see if ER will be approved      Cc: Dr. Simmie Davies, [facility] [address] [place], [place] [address]  Phone [phone] Fax [phone]  A total of 25 minutes was spent face-to-face with this patient. Over half this time was spent on counseling patient on the trigeminal neuralgia, occipital neuropathy diagnosis and different diagnostic and therapeutic options available.

## 2019-03-15 ENCOUNTER — Telehealth: Payer: Self-pay | Admitting: Neurology

## 2019-03-15 ENCOUNTER — Other Ambulatory Visit: Payer: Self-pay | Admitting: Neurology

## 2019-03-15 MED ORDER — GRALISE 600 MG PO TABS: 1200.0000 mg | ORAL_TABLET | Freq: Every evening | ORAL | 6 refills | Status: DC

## 2019-03-15 NOTE — Telephone Encounter (Signed)
I spoke with the patient. He said he picked up the Gabapentin 600 mg tablet for $18 for 30 days. He stated he was told by the pharmacy that this is not a timed release medication. The patient will continue his 400 mg IR capsules QID for now. He will not start the 600 mg tablet. Pt will await a call back from our office.  I called the pharmacy to discuss. Spoke with Cecille Rubin, the pharmacist. She said they do not have a generic Gabapentin ER. A brand ER (ex Horizant, Gralise) would have to be ordered. The Gabapentin once daily that Dr. Jaynee Eagles ordered is still IR per their system.They can mark as error and take the medication back from the patient. I let her know I will talk to Dr. Jaynee Eagles and call back.

## 2019-03-15 NOTE — Telephone Encounter (Addendum)
I spoke with Cecille Rubin at the pharmacy. She understands Gralise was ordered to see if insurance will cover. She stated a PA has been sent over here. I let her know I would update the patient as well and advise him to hold onto the 600 mg tablets and continue his 400 mg capsules until he hears from the pharmacy regarding return of 600 mg tablets.   I updated the pt via telephone. He understands the plan, Gralise ordered (once daily timed release brand of gabapentin), in the meantime hold onto the 600 mg tablets for pending return to pharmacy and continue taking Gabapentin IR 400 mg capsules, 1 capsule four times a day. A PA will be needed for Gralise to see if insurance will approve or not. The pt verbalized understanding and appreciation.

## 2019-03-15 NOTE — Telephone Encounter (Signed)
I placed for Gralise. We can try to get approved. thanks

## 2019-03-15 NOTE — Telephone Encounter (Signed)
Pt states on his last visit with Dr Jaynee Eagles they discussed the 400mg  of the Gabapentin.  Pt states at the pharmacy they have a script for Gabapentin, Once-Daily, 600 MG TABS for him.  Pt is asking for a call to discuss that one.

## 2019-03-16 ENCOUNTER — Encounter: Payer: Self-pay | Admitting: *Deleted

## 2019-03-16 ENCOUNTER — Telehealth: Payer: Self-pay | Admitting: *Deleted

## 2019-03-16 NOTE — Telephone Encounter (Signed)
Billy Knox was approved x 1 year. I called [facility] and spoke with Adria. They are going to order the Billy Knox and it will be here tomorrow. Pt can bring his Gabapentin 600 mg tablets and they can do an exchange for the Billy Knox. The copay is $250. She was unable to determine if this is deductible related or the stagnant copay. Dr. Jaynee Eagles was updated. She advised the day pt starts the Billy Knox it is ok for him to take the Gabapentin IR 400 mg capsules during the day as usual and then that night start the once daily Billy Knox.

## 2019-03-16 NOTE — Telephone Encounter (Signed)
Checked cover my meds, Effective from 03/16/2019 through 03/15/2020.

## 2019-03-16 NOTE — Telephone Encounter (Signed)
Gralise 600 mg PA completed on CMM. Key: [id number]. Awaiting [employer] determination within 3 business days.  "If [employer] [place] has not responded within the specified timeframe or if you have any questions about your PA submission, contact [place] directly at [facility]) [phone] or ([place]) [phone]."

## 2019-03-16 NOTE — Telephone Encounter (Signed)
I spoke with the patient. He understands Gralise was approved x 1 year and that it should arrive to the pharmacy tomorrow. Pt will be able to switch the Gabapentin 600 mg for the Gralise. I let him know the copay is $250. He stated he would try this for one month and see how it works. Pt aware of transition process, take Gabapentin 400 mg capsule as he has been during the day then add the Gralise that evening, then the next day and going foward he will continue Gralise every evening and will no longer take the Gabapentin 400 mg capsule.  He was encouraged to check the information pack with the medication and to keep [place] updated. Pt verbalized appreciation for the call.  Dr. Jaynee Eagles updated.

## 2019-03-16 NOTE — Telephone Encounter (Signed)
Rhea from [facility] has called to report of the approval of the Gralise 600 mg for 1 year.  Rhea said pt has been notified.  Berton Lan can be reached at [phone] if there are questions

## 2019-03-20 ENCOUNTER — Ambulatory Visit: Payer: Medicare Other | Attending: Internal Medicine

## 2019-03-20 DIAGNOSIS — Z23 Encounter for immunization: Secondary | ICD-10-CM | POA: Insufficient documentation

## 2019-03-20 NOTE — Progress Notes (Signed)
   Covid-19 Vaccination Clinic  Name:  Billy Knox    MRN: [id number] DOB: December 29, 1940  03/20/2019  Mr. Billy Knox was observed post Covid-19 immunization for 15 minutes without incident. He was provided with Vaccine Information Sheet and instruction to access the V-Safe system.   Mr. Billy Knox was instructed to call 911 with any severe reactions post vaccine: Marland Kitchen Difficulty breathing  . Swelling of face and throat  . A fast heartbeat  . A bad rash all over body  . Dizziness and weakness   Immunizations Administered    Name Date Dose VIS Date Route   Pfizer COVID-19 Vaccine 03/20/2019 10:05 AM 0.3 mL 12/25/2018 Intramuscular   Manufacturer: [place]   Lot: [id number]   [place]: [id number]

## 2019-03-28 ENCOUNTER — Other Ambulatory Visit: Payer: Self-pay | Admitting: Interventional Cardiology

## 2019-03-31 DIAGNOSIS — M79674 Pain in right toe(s): Secondary | ICD-10-CM | POA: Diagnosis not present

## 2019-03-31 DIAGNOSIS — B351 Tinea unguium: Secondary | ICD-10-CM | POA: Diagnosis not present

## 2019-03-31 DIAGNOSIS — L600 Ingrowing nail: Secondary | ICD-10-CM | POA: Diagnosis not present

## 2019-03-31 DIAGNOSIS — M79675 Pain in left toe(s): Secondary | ICD-10-CM | POA: Diagnosis not present

## 2019-04-14 ENCOUNTER — Telehealth: Payer: Self-pay | Admitting: Neurology

## 2019-04-14 ENCOUNTER — Other Ambulatory Visit: Payer: Self-pay | Admitting: *Deleted

## 2019-04-14 NOTE — Telephone Encounter (Signed)
That is fine  thanks

## 2019-04-14 NOTE — Telephone Encounter (Signed)
Spoke with pt. He took it at 5 pm daily. He said 99% of the time he took it with food. If he even waited 30 minutes he started getting queasy. He said he has been having bouts of pain during the day. He does not feel it was strong enough and wanted Dr. Jaynee Eagles to know. He is happy to switch back to the Gabapentin IR 400 mg QID. Pt wants to finish the last 4-5 days of the Gralise and then will switch back to Gabapentin IR going forward. He verbalized appreciation for the call and stated he will tell the pharmacy to drop the Gralise prescription.

## 2019-04-14 NOTE — Telephone Encounter (Signed)
Pt called to report he will discontinue using the Gabapentin, Once-Daily, (GRALISE) 600 MG TABS states it causes for his stomach to be upset a little and it isnt as strong as stated and copay is $200 a month for medication and doesn't believe it is working for him. Patient states the strength varies on the time of the day and he would like to go back to previous dosage.

## 2019-04-28 DIAGNOSIS — M79674 Pain in right toe(s): Secondary | ICD-10-CM | POA: Diagnosis not present

## 2019-04-28 DIAGNOSIS — B351 Tinea unguium: Secondary | ICD-10-CM | POA: Diagnosis not present

## 2019-04-28 DIAGNOSIS — L600 Ingrowing nail: Secondary | ICD-10-CM | POA: Diagnosis not present

## 2019-04-28 DIAGNOSIS — M79675 Pain in left toe(s): Secondary | ICD-10-CM | POA: Diagnosis not present

## 2019-05-26 DIAGNOSIS — M79675 Pain in left toe(s): Secondary | ICD-10-CM | POA: Diagnosis not present

## 2019-05-26 DIAGNOSIS — M79674 Pain in right toe(s): Secondary | ICD-10-CM | POA: Diagnosis not present

## 2019-05-26 DIAGNOSIS — B351 Tinea unguium: Secondary | ICD-10-CM | POA: Diagnosis not present

## 2019-05-26 DIAGNOSIS — L600 Ingrowing nail: Secondary | ICD-10-CM | POA: Diagnosis not present

## 2019-06-17 DIAGNOSIS — K5900 Constipation, unspecified: Secondary | ICD-10-CM | POA: Diagnosis not present

## 2019-06-17 DIAGNOSIS — M533 Sacrococcygeal disorders, not elsewhere classified: Secondary | ICD-10-CM | POA: Diagnosis not present

## 2019-07-06 DIAGNOSIS — Z961 Presence of intraocular lens: Secondary | ICD-10-CM | POA: Diagnosis not present

## 2019-07-06 DIAGNOSIS — H353113 Nonexudative age-related macular degeneration, right eye, advanced atrophic without subfoveal involvement: Secondary | ICD-10-CM | POA: Diagnosis not present

## 2019-07-06 DIAGNOSIS — H40013 Open angle with borderline findings, low risk, bilateral: Secondary | ICD-10-CM | POA: Diagnosis not present

## 2019-07-06 DIAGNOSIS — H353124 Nonexudative age-related macular degeneration, left eye, advanced atrophic with subfoveal involvement: Secondary | ICD-10-CM | POA: Diagnosis not present

## 2019-09-09 DIAGNOSIS — M21612 Bunion of left foot: Secondary | ICD-10-CM | POA: Diagnosis not present

## 2019-09-09 DIAGNOSIS — M21611 Bunion of right foot: Secondary | ICD-10-CM | POA: Diagnosis not present

## 2019-09-09 DIAGNOSIS — M71572 Other bursitis, not elsewhere classified, left ankle and foot: Secondary | ICD-10-CM | POA: Diagnosis not present

## 2019-09-09 DIAGNOSIS — M71571 Other bursitis, not elsewhere classified, right ankle and foot: Secondary | ICD-10-CM | POA: Diagnosis not present

## 2019-09-24 DIAGNOSIS — Z20822 Contact with and (suspected) exposure to covid-19: Secondary | ICD-10-CM | POA: Diagnosis not present

## 2019-11-19 DIAGNOSIS — N401 Enlarged prostate with lower urinary tract symptoms: Secondary | ICD-10-CM | POA: Diagnosis not present

## 2019-11-24 DIAGNOSIS — L603 Nail dystrophy: Secondary | ICD-10-CM | POA: Diagnosis not present

## 2019-11-24 DIAGNOSIS — I739 Peripheral vascular disease, unspecified: Secondary | ICD-10-CM | POA: Diagnosis not present

## 2019-11-24 DIAGNOSIS — L84 Corns and callosities: Secondary | ICD-10-CM | POA: Diagnosis not present

## 2019-11-25 DIAGNOSIS — N401 Enlarged prostate with lower urinary tract symptoms: Secondary | ICD-10-CM | POA: Diagnosis not present

## 2019-11-25 DIAGNOSIS — R351 Nocturia: Secondary | ICD-10-CM | POA: Diagnosis not present

## 2020-01-03 DIAGNOSIS — G500 Trigeminal neuralgia: Secondary | ICD-10-CM | POA: Diagnosis not present

## 2020-01-03 DIAGNOSIS — H9311 Tinnitus, right ear: Secondary | ICD-10-CM | POA: Diagnosis not present

## 2020-01-03 DIAGNOSIS — H90A22 Sensorineural hearing loss, unilateral, left ear, with restricted hearing on the contralateral side: Secondary | ICD-10-CM | POA: Diagnosis not present

## 2020-01-03 DIAGNOSIS — H90A31 Mixed conductive and sensorineural hearing loss, unilateral, right ear with restricted hearing on the contralateral side: Secondary | ICD-10-CM | POA: Diagnosis not present

## 2020-01-25 DIAGNOSIS — K5900 Constipation, unspecified: Secondary | ICD-10-CM | POA: Diagnosis not present

## 2020-01-25 DIAGNOSIS — K648 Other hemorrhoids: Secondary | ICD-10-CM | POA: Diagnosis not present

## 2020-02-24 DIAGNOSIS — L84 Corns and callosities: Secondary | ICD-10-CM | POA: Diagnosis not present

## 2020-02-24 DIAGNOSIS — L603 Nail dystrophy: Secondary | ICD-10-CM | POA: Diagnosis not present

## 2020-02-24 DIAGNOSIS — I739 Peripheral vascular disease, unspecified: Secondary | ICD-10-CM | POA: Diagnosis not present

## 2020-03-02 ENCOUNTER — Other Ambulatory Visit: Payer: Self-pay | Admitting: Interventional Cardiology

## 2020-03-11 ENCOUNTER — Other Ambulatory Visit: Payer: Self-pay | Admitting: Interventional Cardiology

## 2020-03-13 ENCOUNTER — Other Ambulatory Visit: Payer: Self-pay

## 2020-03-13 MED ORDER — ATORVASTATIN CALCIUM 80 MG PO TABS: 80.0000 mg | ORAL_TABLET | Freq: Every day | ORAL | 0 refills | Status: DC

## 2020-03-16 NOTE — Progress Notes (Signed)
Cardiology Office Note   Date:  03/17/2020   ID:  Billy Knox, DOB 07-25-1940, MRN [id number]  PCP:  Hulan Fess, MD    No chief complaint on file.  CAD  Wt Readings from Last 3 Encounters:  03/17/20 220 lb 3.2 oz (99.9 kg)  01/28/19 216 lb 9.6 oz (98.2 kg)  11/25/17 212 lb 12.8 oz (96.5 kg)       History of Present Illness: Billy Knox is a 80 y.o. male   with CAD, CTO of RCA diagnosed many years ago, Left to right collaterals, medically managed. He has had gamma knife surgery for tic doloreux in 2013.  He has lost over 20 pounds in 2016. He gained some of the weight back due to dietary indiscretion.  He had An episode of chest pain in 12/16. He had esophageal dilatation with relief of sx. (Schatzki's ring).  In early 2018, he had a syncopal episodeafter neurosurgeryin 11/17(Nucleus Caudalis Dorsal root entry zone, for facial pain from trigeminal neuralgia. Dr. Leonia Corona.He had a broken left eye socket. He hadanextensive evaluation. He thinks he was overmedicated with his neurologic medications. Since reducing the dosages, he has not had any further syncope.He felt "loopy" at that time. THis has resolved.  Unfortunately, his pain was worse after the surgery.  In 2019: "lost 30 lbs by cutting out starches and carbs. Walks 3-5 miles/day. Walked 1100 miles so far this year!Also does ballroom dancing. "  In 2020, In-grown toe nails have limited his walking of late.  Had surgery that went well on both feet.  Granddaughter had COVID but he was not exposed.  He has avoided COVID.   Denies : Chest pain. Dizziness. Leg edema. Nitroglycerin use. Orthopnea. Palpitations. Paroxysmal nocturnal dyspnea. Shortness of breath. Syncope.   Gave up driving in September 2021 due to macular degeneration.  He feels that he is going blind.  Still walks 2.5 miles/day. No falls.    Past Medical History:  Diagnosis Date  . 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 Colon polyp   . Coronary artery disease   . Deafness in right ear   . Esophageal stricture   . GERD (gastroesophageal reflux disease)    past history no recent problems  . Hyperchloremia   . Hypertension   . Macular degeneration   . Myocardial infarction [facility]) 2007   hx. silent MI - no intervention except oral meds   . Ringing in ears    right  . Rt facial numbness   . 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 Sudden blockage of esophagus 07/2017  . Trigeminal neuralgia     Past Surgical History:  Procedure Laterality Date  . CYSTOSCOPY    . ESOPHAGOGASTRODUODENOSCOPY    . ESOPHAGOGASTRODUODENOSCOPY  06/14/2011   Procedure: ESOPHAGOGASTRODUODENOSCOPY (EGD);  Surgeon: Winfield Cunas., MD;  Location: [facility];  Service: Endoscopy;  Laterality: N/A;  . ESOPHAGOGASTRODUODENOSCOPY N/A 07/28/2017   Procedure: ESOPHAGOGASTRODUODENOSCOPY (EGD);  Surgeon: Laurence Spates, MD;  Location: [facility];  Service: Endoscopy;  Laterality: N/A;  . ESOPHAGOGASTRODUODENOSCOPY (EGD) WITH PROPOFOL N/A 08/12/2014   Procedure: ESOPHAGOGASTRODUODENOSCOPY (EGD) WITH PROPOFOL;  Surgeon: Laurence Spates, MD;  Location: [facility];  Service: Endoscopy;  Laterality: N/A;  . ESOPHAGOGASTRODUODENOSCOPY (EGD) WITH PROPOFOL N/A 10/29/2016   Procedure: ESOPHAGOGASTRODUODENOSCOPY (EGD) WITH PROPOFOL;  Surgeon: Laurence Spates, MD;  Location: [facility];  Service: Endoscopy;  Laterality: N/A;  . FOREIGN BODY REMOVAL  07/28/2017   Procedure: FOREIGN BODY REMOVAL;  Surgeon: Laurence Spates, MD;  Location: [facility];  Service: Endoscopy;;  . gamma knife     3 yrs ago [place] area around trigeminal nerve area"  . PROSTATE BIOPSY     x 3  . SAVORY DILATION  06/14/2011   Procedure: SAVORY DILATION;  Surgeon: Winfield Cunas., MD;  Location: [facility];  Service: Endoscopy;  Laterality: N/A;  need xray  .  SAVORY DILATION N/A 08/12/2014   Procedure: SAVORY DILATION;  Surgeon: Laurence Spates, MD;  Location: [facility];  Service: Endoscopy;  Laterality: N/A;  . SAVORY DILATION N/A 10/29/2016   Procedure: SAVORY DILATION;  Surgeon: Laurence Spates, MD;  Location: [facility];  Service: Endoscopy;  Laterality: N/A;  . TONSILLECTOMY      child  . TRIGEMINAL NERVE BALLOON DECOMPRESSION Right    right ear deafness x 4 total     Current Outpatient Medications  Medication Sig Dispense Refill  . acetaminophen (TYLENOL) 500 MG tablet Take 1,000 mg by mouth every 6 (six) hours as needed for mild pain.    Marland Kitchen aspirin 81 MG tablet Take 81 mg by mouth daily.    Marland Kitchen atorvastatin (LIPITOR) 80 MG tablet Take 1 tablet (80 mg total) by mouth daily at 6 PM. Please make overdue appt with Dr. Irish Lack before anymore refills. Thank you 1st attempt 30 tablet 0  . finasteride (PROSCAR) 5 MG tablet Take 5 mg by mouth daily.    Marland Kitchen gabapentin (NEURONTIN) 400 MG capsule TAKE ONE CAPSULE BY MOUTH FOUR TIMES DAILY, EVERY 5 HOURS. GENERIC EQUIVALENT FOR NEURONTIN. 360 capsule 4  . Glucosamine HCl (GLUCOSAMINE PO) Take 1,200 mg by mouth daily.    Marland Kitchen lamoTRIgine (LAMICTAL) 100 MG tablet Take 1 tablet (100 mg total) by mouth 2 (two) times daily. 180 tablet 4  . LINZESS 145 MCG CAPS capsule Take 145 mcg by mouth every morning.    Marland Kitchen lisinopril (ZESTRIL) 20 MG tablet Take 1 tablet by mouth once daily 90 tablet 3  . metoprolol succinate (TOPROL-XL) 50 MG 24 hr tablet Take 1 tablet (50 mg total) by mouth daily. With or immediately following a meal. Please make overdue appt with Dr. Irish Lack before anymore refills. Thank you 1st attempt 30 tablet 0  . Multiple Vitamin (MULTIVITAMIN WITH MINERALS) TABS tablet Take 1 tablet by mouth daily.    . nitroGLYCERIN (NITROSTAT) 0.4 MG SL tablet Place 1 tablet (0.4 mg total) under the tongue every 5 (five) minutes as needed for chest pain. 25 tablet 6  . Omega-3 Fatty Acids (FISH OIL) 1200 MG CAPS  Take 4,800 mg by mouth 2 (two) times daily.     . Omeprazole (PRILOSEC PO) Take by mouth daily.    . tamsulosin (FLOMAX) 0.4 MG CAPS capsule Take 1 capsule by mouth daily.  11   No current facility-administered medications for this visit.    Allergies:   Patient has no known allergies.    Social History:  The patient  reports that he quit smoking about 52 years ago. He has never used smokeless tobacco. He reports that he does not drink alcohol and does not use drugs.   Family History:  The patient's family history includes Alcohol abuse in his father; Colon cancer in his mother; Heart disease in his mother; Liver cancer in his sister.    ROS:  Please see the history of present illness.   Otherwise, review of systems  are positive for joint pain.   All other systems are reviewed and negative.    PHYSICAL EXAM: VS:  BP 118/76   Pulse 69   Ht 6' (1.829 m)   Wt 220 lb 3.2 oz (99.9 kg)   SpO2 94%   BMI 29.86 kg/m  , BMI Body mass index is 29.86 kg/m. GEN: Well nourished, well developed, in no acute distress  HEENT: normal  Neck: no JVD, carotid bruits, or masses Cardiac: RRR; no murmurs, rubs, or gallops,no edema  Respiratory:  clear to auscultation bilaterally, normal work of breathing GI: soft, nontender, nondistended, + BS MS: no deformity or atrophy  Skin: warm and dry, no rash Neuro:  Strength and sensation are intact Psych: euthymic mood, full affect   EKG:   The ekg ordered today demonstrates NSR, no ST changes   Recent Labs: No results found for requested labs within last 8760 hours.   Lipid Panel    Component Value Date/Time   CHOL 127 03/19/2017 0747   TRIG 97 03/19/2017 0747   HDL 34 (L) 03/19/2017 0747   CHOLHDL 3.7 03/19/2017 0747   CHOLHDL 3.9 09/08/2015 0734   VLDL 28 09/08/2015 0734   LDLCALC 74 03/19/2017 0747     Other studies Reviewed: Additional studies/ records that were reviewed today with results demonstrating: LDL 79 in  2021.   ASSESSMENT AND PLAN:  1. CAD: Continue aggressive secondary prevention.  Avoid processed foods.  Regular exercise as noted below.   2. HTN: Low-salt diet.  Regular exercise.  The current medical regimen is effective;  continue present plan and medications.  Home readings are stable.  3. Hyperlipidemia: Whole food, plant-based diet.  Continue lipid-lowering therapy.  Will have lipids rechecked with PMD.  4. Given vision issues, he may be at higher risk for falling.  Take extra precautions to avoid falling.     Current medicines are reviewed at length with the patient today.  The patient concerns regarding his medicines were addressed.  The following changes have been made:  No change  Labs/ tests ordered today include:  No orders of the defined types were placed in this encounter.   Recommend 150 minutes/week of aerobic exercise Low fat, low carb, high fiber diet recommended  Disposition:   FU in 1 year   Signed, Larae Grooms, MD  03/17/2020 [facility] [place], [place], [place]  [address] Phone: [phone]; Fax: [phone]

## 2020-03-17 ENCOUNTER — Encounter: Payer: Self-pay | Admitting: Interventional Cardiology

## 2020-03-17 ENCOUNTER — Other Ambulatory Visit: Payer: Self-pay

## 2020-03-17 ENCOUNTER — Ambulatory Visit: Payer: Medicare Other | Admitting: Interventional Cardiology

## 2020-03-17 VITALS — BP 118/76 | HR 69 | Ht 72.0 in | Wt 220.2 lb

## 2020-03-17 DIAGNOSIS — E782 Mixed hyperlipidemia: Secondary | ICD-10-CM | POA: Diagnosis not present

## 2020-03-17 DIAGNOSIS — I2510 Atherosclerotic heart disease of native coronary artery without angina pectoris: Secondary | ICD-10-CM

## 2020-03-17 DIAGNOSIS — I10 Essential (primary) hypertension: Secondary | ICD-10-CM

## 2020-03-17 MED ORDER — ATORVASTATIN CALCIUM 80 MG PO TABS: 80.0000 mg | ORAL_TABLET | Freq: Every day | ORAL | 3 refills | Status: DC

## 2020-03-17 MED ORDER — LISINOPRIL 20 MG PO TABS: 20.0000 mg | ORAL_TABLET | Freq: Every day | ORAL | 3 refills | Status: DC

## 2020-03-17 MED ORDER — NITROGLYCERIN 0.4 MG SL SUBL: 0.4000 mg | SUBLINGUAL_TABLET | SUBLINGUAL | 6 refills | Status: AC | PRN

## 2020-03-17 MED ORDER — METOPROLOL SUCCINATE ER 50 MG PO TB24: 50.0000 mg | ORAL_TABLET | Freq: Every day | ORAL | 3 refills | Status: DC

## 2020-03-17 NOTE — Patient Instructions (Signed)

## 2020-03-28 DIAGNOSIS — H353124 Nonexudative age-related macular degeneration, left eye, advanced atrophic with subfoveal involvement: Secondary | ICD-10-CM | POA: Diagnosis not present

## 2020-03-28 DIAGNOSIS — H353211 Exudative age-related macular degeneration, right eye, with active choroidal neovascularization: Secondary | ICD-10-CM | POA: Diagnosis not present

## 2020-03-28 DIAGNOSIS — H353113 Nonexudative age-related macular degeneration, right eye, advanced atrophic without subfoveal involvement: Secondary | ICD-10-CM | POA: Diagnosis not present

## 2020-03-28 DIAGNOSIS — H40013 Open angle with borderline findings, low risk, bilateral: Secondary | ICD-10-CM | POA: Diagnosis not present

## 2020-04-10 DIAGNOSIS — H43823 Vitreomacular adhesion, bilateral: Secondary | ICD-10-CM | POA: Diagnosis not present

## 2020-04-10 DIAGNOSIS — H43813 Vitreous degeneration, bilateral: Secondary | ICD-10-CM | POA: Diagnosis not present

## 2020-04-10 DIAGNOSIS — H353133 Nonexudative age-related macular degeneration, bilateral, advanced atrophic without subfoveal involvement: Secondary | ICD-10-CM | POA: Diagnosis not present

## 2020-05-25 DIAGNOSIS — I739 Peripheral vascular disease, unspecified: Secondary | ICD-10-CM | POA: Diagnosis not present

## 2020-05-25 DIAGNOSIS — L603 Nail dystrophy: Secondary | ICD-10-CM | POA: Diagnosis not present

## 2020-05-25 DIAGNOSIS — L84 Corns and callosities: Secondary | ICD-10-CM | POA: Diagnosis not present

## 2020-05-30 DIAGNOSIS — M9903 Segmental and somatic dysfunction of lumbar region: Secondary | ICD-10-CM | POA: Diagnosis not present

## 2020-05-30 DIAGNOSIS — H353133 Nonexudative age-related macular degeneration, bilateral, advanced atrophic without subfoveal involvement: Secondary | ICD-10-CM | POA: Diagnosis not present

## 2020-05-30 DIAGNOSIS — H35373 Puckering of macula, bilateral: Secondary | ICD-10-CM | POA: Diagnosis not present

## 2020-05-30 DIAGNOSIS — M4726 Other spondylosis with radiculopathy, lumbar region: Secondary | ICD-10-CM | POA: Diagnosis not present

## 2020-05-30 DIAGNOSIS — H43813 Vitreous degeneration, bilateral: Secondary | ICD-10-CM | POA: Diagnosis not present

## 2020-05-31 DIAGNOSIS — M9903 Segmental and somatic dysfunction of lumbar region: Secondary | ICD-10-CM | POA: Diagnosis not present

## 2020-05-31 DIAGNOSIS — M4726 Other spondylosis with radiculopathy, lumbar region: Secondary | ICD-10-CM | POA: Diagnosis not present

## 2020-06-02 DIAGNOSIS — M4726 Other spondylosis with radiculopathy, lumbar region: Secondary | ICD-10-CM | POA: Diagnosis not present

## 2020-06-02 DIAGNOSIS — M9903 Segmental and somatic dysfunction of lumbar region: Secondary | ICD-10-CM | POA: Diagnosis not present

## 2020-06-05 DIAGNOSIS — Z1389 Encounter for screening for other disorder: Secondary | ICD-10-CM | POA: Diagnosis not present

## 2020-06-05 DIAGNOSIS — E782 Mixed hyperlipidemia: Secondary | ICD-10-CM | POA: Diagnosis not present

## 2020-06-05 DIAGNOSIS — Z0000 Encounter for general adult medical examination without abnormal findings: Secondary | ICD-10-CM | POA: Diagnosis not present

## 2020-06-05 DIAGNOSIS — I10 Essential (primary) hypertension: Secondary | ICD-10-CM | POA: Diagnosis not present

## 2020-06-05 DIAGNOSIS — R7303 Prediabetes: Secondary | ICD-10-CM | POA: Diagnosis not present

## 2020-06-06 DIAGNOSIS — M9903 Segmental and somatic dysfunction of lumbar region: Secondary | ICD-10-CM | POA: Diagnosis not present

## 2020-06-06 DIAGNOSIS — M4726 Other spondylosis with radiculopathy, lumbar region: Secondary | ICD-10-CM | POA: Diagnosis not present

## 2020-06-08 DIAGNOSIS — M9903 Segmental and somatic dysfunction of lumbar region: Secondary | ICD-10-CM | POA: Diagnosis not present

## 2020-06-08 DIAGNOSIS — M4726 Other spondylosis with radiculopathy, lumbar region: Secondary | ICD-10-CM | POA: Diagnosis not present

## 2020-06-13 DIAGNOSIS — M9903 Segmental and somatic dysfunction of lumbar region: Secondary | ICD-10-CM | POA: Diagnosis not present

## 2020-06-13 DIAGNOSIS — M9906 Segmental and somatic dysfunction of lower extremity: Secondary | ICD-10-CM | POA: Diagnosis not present

## 2020-06-13 DIAGNOSIS — S83411A Sprain of medial collateral ligament of right knee, initial encounter: Secondary | ICD-10-CM | POA: Diagnosis not present

## 2020-06-13 DIAGNOSIS — M4726 Other spondylosis with radiculopathy, lumbar region: Secondary | ICD-10-CM | POA: Diagnosis not present

## 2020-06-14 DIAGNOSIS — I25119 Atherosclerotic heart disease of native coronary artery with unspecified angina pectoris: Secondary | ICD-10-CM | POA: Diagnosis not present

## 2020-06-14 DIAGNOSIS — Z0000 Encounter for general adult medical examination without abnormal findings: Secondary | ICD-10-CM | POA: Diagnosis not present

## 2020-06-14 DIAGNOSIS — E782 Mixed hyperlipidemia: Secondary | ICD-10-CM | POA: Diagnosis not present

## 2020-06-14 DIAGNOSIS — I10 Essential (primary) hypertension: Secondary | ICD-10-CM | POA: Diagnosis not present

## 2020-06-15 DIAGNOSIS — S83411A Sprain of medial collateral ligament of right knee, initial encounter: Secondary | ICD-10-CM | POA: Diagnosis not present

## 2020-06-15 DIAGNOSIS — M4726 Other spondylosis with radiculopathy, lumbar region: Secondary | ICD-10-CM | POA: Diagnosis not present

## 2020-06-15 DIAGNOSIS — M9906 Segmental and somatic dysfunction of lower extremity: Secondary | ICD-10-CM | POA: Diagnosis not present

## 2020-06-15 DIAGNOSIS — M9903 Segmental and somatic dysfunction of lumbar region: Secondary | ICD-10-CM | POA: Diagnosis not present

## 2020-06-19 ENCOUNTER — Telehealth: Payer: Self-pay | Admitting: Neurology

## 2020-06-19 ENCOUNTER — Other Ambulatory Visit: Payer: Self-pay | Admitting: Neurology

## 2020-06-19 DIAGNOSIS — M9906 Segmental and somatic dysfunction of lower extremity: Secondary | ICD-10-CM | POA: Diagnosis not present

## 2020-06-19 DIAGNOSIS — G500 Trigeminal neuralgia: Secondary | ICD-10-CM

## 2020-06-19 DIAGNOSIS — M5481 Occipital neuralgia: Secondary | ICD-10-CM

## 2020-06-19 DIAGNOSIS — M792 Neuralgia and neuritis, unspecified: Secondary | ICD-10-CM

## 2020-06-19 DIAGNOSIS — M4726 Other spondylosis with radiculopathy, lumbar region: Secondary | ICD-10-CM | POA: Diagnosis not present

## 2020-06-19 DIAGNOSIS — S83411A Sprain of medial collateral ligament of right knee, initial encounter: Secondary | ICD-10-CM | POA: Diagnosis not present

## 2020-06-19 DIAGNOSIS — M9903 Segmental and somatic dysfunction of lumbar region: Secondary | ICD-10-CM | POA: Diagnosis not present

## 2020-06-19 MED ORDER — GABAPENTIN 400 MG PO CAPS: ORAL_CAPSULE | ORAL | 0 refills | Status: DC

## 2020-06-19 NOTE — Telephone Encounter (Signed)
Spoke with patient.  We scheduled him for a follow-up with Amy NP on Monday 08/14/20 at 2:30 pm.  Another refill was sent to the pharmacy to get him through appt. Patient verbalized appreciation.  I also placed him on the wait list per his request.

## 2020-06-19 NOTE — Telephone Encounter (Signed)
Pt states re: gabapentin (NEURONTIN) 400 MG capsule he normally gets 360  capsules,this time he only received 120 pt is asking for a call to discuss why the change

## 2020-06-19 NOTE — Addendum Note (Signed)
Addended by: Gildardo Griffes on: 06/19/2020 03:39 PM   Modules accepted: Orders

## 2020-06-22 DIAGNOSIS — M9903 Segmental and somatic dysfunction of lumbar region: Secondary | ICD-10-CM | POA: Diagnosis not present

## 2020-06-22 DIAGNOSIS — S83411A Sprain of medial collateral ligament of right knee, initial encounter: Secondary | ICD-10-CM | POA: Diagnosis not present

## 2020-06-22 DIAGNOSIS — M9906 Segmental and somatic dysfunction of lower extremity: Secondary | ICD-10-CM | POA: Diagnosis not present

## 2020-06-22 DIAGNOSIS — M4726 Other spondylosis with radiculopathy, lumbar region: Secondary | ICD-10-CM | POA: Diagnosis not present

## 2020-06-26 DIAGNOSIS — M4726 Other spondylosis with radiculopathy, lumbar region: Secondary | ICD-10-CM | POA: Diagnosis not present

## 2020-06-26 DIAGNOSIS — M9903 Segmental and somatic dysfunction of lumbar region: Secondary | ICD-10-CM | POA: Diagnosis not present

## 2020-06-26 DIAGNOSIS — S83411A Sprain of medial collateral ligament of right knee, initial encounter: Secondary | ICD-10-CM | POA: Diagnosis not present

## 2020-06-26 DIAGNOSIS — M9906 Segmental and somatic dysfunction of lower extremity: Secondary | ICD-10-CM | POA: Diagnosis not present

## 2020-06-29 DIAGNOSIS — M4726 Other spondylosis with radiculopathy, lumbar region: Secondary | ICD-10-CM | POA: Diagnosis not present

## 2020-06-29 DIAGNOSIS — M9906 Segmental and somatic dysfunction of lower extremity: Secondary | ICD-10-CM | POA: Diagnosis not present

## 2020-06-29 DIAGNOSIS — S83411A Sprain of medial collateral ligament of right knee, initial encounter: Secondary | ICD-10-CM | POA: Diagnosis not present

## 2020-06-29 DIAGNOSIS — M9903 Segmental and somatic dysfunction of lumbar region: Secondary | ICD-10-CM | POA: Diagnosis not present

## 2020-07-04 DIAGNOSIS — M9906 Segmental and somatic dysfunction of lower extremity: Secondary | ICD-10-CM | POA: Diagnosis not present

## 2020-07-04 DIAGNOSIS — M9903 Segmental and somatic dysfunction of lumbar region: Secondary | ICD-10-CM | POA: Diagnosis not present

## 2020-07-04 DIAGNOSIS — M4726 Other spondylosis with radiculopathy, lumbar region: Secondary | ICD-10-CM | POA: Diagnosis not present

## 2020-07-04 DIAGNOSIS — S83411A Sprain of medial collateral ligament of right knee, initial encounter: Secondary | ICD-10-CM | POA: Diagnosis not present

## 2020-08-01 DIAGNOSIS — M9903 Segmental and somatic dysfunction of lumbar region: Secondary | ICD-10-CM | POA: Diagnosis not present

## 2020-08-01 DIAGNOSIS — M9906 Segmental and somatic dysfunction of lower extremity: Secondary | ICD-10-CM | POA: Diagnosis not present

## 2020-08-01 DIAGNOSIS — M4726 Other spondylosis with radiculopathy, lumbar region: Secondary | ICD-10-CM | POA: Diagnosis not present

## 2020-08-01 DIAGNOSIS — S83411A Sprain of medial collateral ligament of right knee, initial encounter: Secondary | ICD-10-CM | POA: Diagnosis not present

## 2020-08-14 ENCOUNTER — Other Ambulatory Visit: Payer: Self-pay

## 2020-08-14 ENCOUNTER — Ambulatory Visit: Payer: Medicare Other | Admitting: Family Medicine

## 2020-08-14 ENCOUNTER — Encounter: Payer: Self-pay | Admitting: Family Medicine

## 2020-08-14 VITALS — BP 133/81 | HR 63 | Ht 72.0 in | Wt 216.0 lb

## 2020-08-14 DIAGNOSIS — M792 Neuralgia and neuritis, unspecified: Secondary | ICD-10-CM | POA: Diagnosis not present

## 2020-08-14 DIAGNOSIS — G500 Trigeminal neuralgia: Secondary | ICD-10-CM | POA: Diagnosis not present

## 2020-08-14 DIAGNOSIS — M5481 Occipital neuralgia: Secondary | ICD-10-CM | POA: Diagnosis not present

## 2020-08-14 MED ORDER — GABAPENTIN 400 MG PO CAPS
ORAL_CAPSULE | ORAL | 3 refills | Status: DC
Start: 2020-08-14 — End: 2021-08-08

## 2020-08-14 NOTE — Progress Notes (Signed)
Chief Complaint  Patient presents with   Follow-up    New rm, alone. Here for yearly f/u, pt has d/c Lamictal in over a year. Pt reports doing well, no issues or concerns      HISTORY OF PRESENT ILLNESS: 08/14/20 ALL:  SYLVESTER ROMBERGER is a 80 y.o. male here today for follow up for trigeminal neuralgia. He was switched to Gralise, however, he did not feel it worked as well as gabapentin. He continues gabapentin '400mg'$  TID. He has weaned lamotrigine . He has not taken this medication in over a year and feels he has been doing well. If he misses a dose of gabapentin, pain becomes unbearable in about 2 hours. He has an alarm for 7am, 12pm, 5pm and 10pm. He has more pain in the morning and can tell its time for his medication. He does not wish to adjust dose due to feeling groggy. He has intermittent burning of the right occipital region. He see a chiropractor who uses acupuncture and pressure point treatment that helps. He is followed by ophthalmology for macular degeneration.    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  In the past he had done well with 300 mg gabapentin 4-5 times a day but if he misses a dose or is delayed the pain comes back, he not only has trigeminal neuralgia but he also has neuropathic pain from surgery.  We have tried multiple medications, gabapentin, Lamictal, at this time we will try to increase his gabapentin but he has been having a hard time tolerating it not sure he can tolerate higher IR, we will also try to get gabapentin extended release.   Interval history 10/02/2017 AA: He is here for follow up of Trigeminal Neuralgia. Extensive history of this including surgeries and failure of multiple meds  including botox. He is on Gabapentin and Lamictal. He says he feels worse with increased area of pain on the right in the trigeminal area, and in the incision site. If he is having a bad day he takes Gabapentin. Still having ififculty touching face, especially shaving. He has lost more taste.  He also feels he has lost smell and taste. He denies any tremors of the hands or falls. He has a slight head tremor that he notices nothing significant. He walking well, no falls. Discussed he is not parkinsonian on exam. Otherwise for TGN, the medicine is working, if he misses the pain gets worse. Feels pain is "under control" currently. Discussed increased Gabapentin can take it every 3 hours as needed, may increase Lamictal as well. Discussed. He has ben to [facility] and multiple other institutions. Injecting alcohol into the nerve helped once.   Interval history 09/24/2016 AA: botulinum toxin in the right trigeminal area did not help. He has continued pain in the are of the scar and now getting more pain in the occipital area, sharp, burning since the surgery suspect injury to the occipital nerve. He takes a '300mg'$  gabapentin every 5.5 hours which helps. He has not tried Lamictal, baclofen, topiramate, valproate. Will try Lamictal   HPI:  TELLER BALGOBIN is a 80 y.o. male here as a referral from Dr. Rex Kras for trigeminal neuralgia. 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 He is status post gamma knife radiosurgical rhizotomy in 2012 and recent dorsal root entry zone surgery. He has had 4 surgeries as far back as the 90s. Started 25 years ago first surgery was in 1992. He has seen in December at [facility] correct and his neurontin was increased and he had a fall. He lost feeling in his left lip after recent fall (see CT below). Yesterday he had pain like a hot poker through the scalp (Right high  parietal lobe) and now pain around the right side of the ear. Since the DREZ he still gets flashes of pain in the right side of the face. Feels like someone has hit him all the time. Throbbing and pounding. With light and sound sensitivity and nausea. No medication overuse. He says the trigeminal nerve pain shoots into all three zones (v1,v2,v3). When he first got the trigeminal neuralgia food would trigger it. He has also been to the head of Neurology at [facility] as well as [facility]. Unknown triggers, sporadic, sometimes touching his face with cause the pain, turning the head, wind and chewing. Lightning, severe, brief. He has an aching pain on the right which is constant. Like a pressure. He has daily headaches and half of them a month are migrainous (unilateral right side of head, light, sound sensitivity, pounding, throbbing, nausea) ongoing at this frequency for years. No aura. No medication overuse. He also has neck pain and stiff muscles on the right with decreased ROM, slowly progressive, cannot use muscle relaxers due to side effects and sedation and risk of falls, has tried massage and PT without help, pain and decreased ROM.   Meds tried include: Oxcarbazepine, Vimpat, gabapentin, clonazepam, tiagabine, oxycodone, flexeril, tramadol, naltrexone, Tegretol(did not help).   Reviewed notes, labs and imaging from outside physicians, which showed:   CBC showed elevated white blood cells 3 weeks ago [id number] with neutrophilic predominance otherwise normal. CMP showed normal sodium, chloride 100, glucose 108, total protein 6 otherwise unremarkable.   Personally reviewed images CT of the head and agree with the following:   IMPRESSION: CT head 02/26/2016 (in ED for a fall): Atrophy with supratentorial small vessel disease. No intracranial mass hemorrhage, or extra-axial fluid collection. No acute infarct evident. Previous surgical removal a portion of the lateral right occipital bone. Areas of  arterial vascular calcification noted.   Primary diagnosis patient is suffering from trigeminal neuralgia for several decades. He was operated with microvascular decompression several times also treated with gamma knife without much effect. He recently stopped oxycodone. He was recently treated with DREZ by Dr. Mearl Latin and is complaining about postprocedural resurgence of neuralgic pain in V2 and also retro-orbital pain always on the right side. Diagnosed with postprocedural flare up. They added low-dose naltrexone increased gabapentin and also added Vimpat. Repeating his neuro imaging was suggested.   REVIEW OF SYSTEMS: Out of a complete 14 system review of symptoms, the patient complains only of the following symptoms,difficulty with vision, neuropathic pain and all other reviewed systems are negative.   ALLERGIES: No Known Allergies   HOME MEDICATIONS: Outpatient Medications Prior to Visit  Medication Sig Dispense Refill   acetaminophen (TYLENOL) 500 MG tablet Take 1,000 mg by mouth every 6 (six) hours as needed for mild pain.     aspirin 81 MG tablet Take 81 mg by mouth daily.     atorvastatin (LIPITOR) 80 MG tablet Take 1 tablet (80 mg total) by mouth daily at  6 PM. 90 tablet 3   finasteride (PROSCAR) 5 MG tablet Take 5 mg by mouth daily.     gabapentin (NEURONTIN) 400 MG capsule TAKE 1 CAPSULE BY MOUTH 4 TIMES DAILY (EVERY 5 HOURS) 120 capsule 0   Glucosamine HCl (GLUCOSAMINE PO) Take 1,000 mg by mouth daily.     LINZESS 145 MCG CAPS capsule Take 145 mcg by mouth every morning.     lisinopril (ZESTRIL) 20 MG tablet Take 1 tablet (20 mg total) by mouth daily. 90 tablet 3   metoprolol succinate (TOPROL-XL) 50 MG 24 hr tablet Take 1 tablet (50 mg total) by mouth daily. 90 tablet 3   Multiple Vitamin (MULTIVITAMIN WITH MINERALS) TABS tablet Take 1 tablet by mouth daily.     neomycin-polymyxin b-dexamethasone (MAXITROL) 3.5-10000-0.1 OINT Place 1 application into the right eye 3 (three) times  a week.     nitroGLYCERIN (NITROSTAT) 0.4 MG SL tablet Place 1 tablet (0.4 mg total) under the tongue every 5 (five) minutes as needed for chest pain. 25 tablet 6   Omega-3 Fatty Acids (FISH OIL) 1200 MG CAPS Take 4,800 mg by mouth 2 (two) times daily.      Omeprazole (PRILOSEC PO) Take by mouth daily.     tamsulosin (FLOMAX) 0.4 MG CAPS capsule Take 1 capsule by mouth daily.  11   lamoTRIgine (LAMICTAL) 100 MG tablet Take 1 tablet (100 mg total) by mouth 2 (two) times daily. 180 tablet 4   No facility-administered medications prior to visit.     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   Sudden blockage of esophagus 07/2017   Trigeminal neuralgia      PAST SURGICAL HISTORY: Past Surgical History:  Procedure Laterality Date   CYSTOSCOPY     ESOPHAGOGASTRODUODENOSCOPY     ESOPHAGOGASTRODUODENOSCOPY  06/14/2011   Procedure: ESOPHAGOGASTRODUODENOSCOPY (EGD);  Surgeon: Winfield Cunas., MD;  Location: [facility];  Service: Endoscopy;  Laterality: N/A;   ESOPHAGOGASTRODUODENOSCOPY N/A 07/28/2017   Procedure: ESOPHAGOGASTRODUODENOSCOPY (EGD);  Surgeon: Laurence Spates, MD;  Location: [facility];  Service: Endoscopy;  Laterality: N/A;   ESOPHAGOGASTRODUODENOSCOPY (EGD) WITH PROPOFOL N/A 08/12/2014   Procedure: ESOPHAGOGASTRODUODENOSCOPY (EGD) WITH PROPOFOL;  Surgeon: Laurence Spates, MD;  [facility]: [facility];  Service: Endoscopy;  Laterality:  N/A;   ESOPHAGOGASTRODUODENOSCOPY (EGD) WITH PROPOFOL N/A 10/29/2016   Procedure: ESOPHAGOGASTRODUODENOSCOPY (EGD) WITH PROPOFOL;  Surgeon: Laurence Spates, MD;  [facility];  Service: Endoscopy;  Laterality: N/A;   FOREIGN BODY REMOVAL  07/28/2017   Procedure: FOREIGN BODY REMOVAL;  Surgeon: Laurence Spates, MD;  [facility]: [facility];  Service: Endoscopy;;   gamma knife     3 yrs ago [facility]"brain area around trigeminal nerve area"   PROSTATE BIOPSY     x 3   SAVORY DILATION  06/14/2011   Procedure: SAVORY DILATION;  Surgeon: Winfield Cunas., MD;  Location: [facility];  Service: Endoscopy;  Laterality: N/A;  need xray   SAVORY DILATION N/A 08/12/2014   Procedure: SAVORY DILATION;  Surgeon: Laurence Spates, MD;  [facility];  Service: Endoscopy;  Laterality: N/A;   SAVORY DILATION N/A 10/29/2016   Procedure: SAVORY DILATION;  Surgeon: Laurence Spates, MD;  [facility];  Service: Endoscopy;  Laterality: N/A;   TONSILLECTOMY      child   TRIGEMINAL NERVE BALLOON DECOMPRESSION Right    right ear deafness x 4 total     FAMILY HISTORY: Family History  Problem Relation Age of Onset   Colon cancer Mother    Heart disease Mother    Liver cancer Sister    Alcohol abuse Father      SOCIAL HISTORY: Social History   Socioeconomic History   Marital status: Married    Spouse name: Not on file   Number of children: 1   Years of education: 12   Highest education level: Not on file  Occupational History   Occupation: Retired  Tobacco Use   Smoking status: Former    Types: Cigarettes    Quit date: 06/14/1967    Years since quitting: 53.2   Smokeless tobacco: Never  Vaping Use   Vaping Use: Never used  Substance and Sexual Activity   Alcohol use: No    Alcohol/week: 1.0 standard drink    Types: 1 Standard drinks or equivalent per week   Drug use: No   Sexual activity: Not on file  Other Topics Concern   Not on file  Social History Narrative    Lives at home w/ his wife   Right-handed   Caffeine: 2 cups per day   Social Determinants of Health   Financial Resource Strain: Not on file  Food Insecurity: Not on file  Transportation Needs: Not on file  Physical Activity: Not on file  Stress: Not on file  Social Connections: Not on file  Intimate Partner Violence: Not on file     PHYSICAL EXAM  Vitals:   08/14/20 1421  BP: 133/81  Pulse: 63  Weight: 216 lb (98 kg)  Height: 6' (1.829 m)   Body mass index is 29.29 kg/m.   Generalized: Well developed, in no acute distress  Cardiology: normal rate and rhythm, no murmur auscultated  Respiratory: clear to auscultation bilaterally    Neurological examination  Mentation: Alert oriented to time, place, history taking. Follows all commands speech and language fluent Cranial nerve II-XII: Pupils were equal round reactive to light. Extraocular movements were full, visual field were full on confrontational test. Facial sensation and strength were normal. Head turning and shoulder shrug  were normal and symmetric. Motor: The motor testing reveals 5 over 5 strength of all 4 extremities. Good symmetric motor tone is noted throughout.  Gait and station: Gait is normal.    DIAGNOSTIC DATA (LABS, IMAGING, TESTING) - I reviewed patient records, labs, notes, testing and imaging myself where available.  Lab Results  Component Value Date   WBC 11.8 (H) 02/26/2016   HGB 14.6 02/26/2016   HCT 42.0 02/26/2016   MCV 91.9 02/26/2016   PLT 183 02/26/2016      Component Value Date/Time   NA 138 02/26/2016 1112   K 4.1 02/26/2016 1112   CL 100 (L) 02/26/2016 1112   CO2 29 02/26/2016 1112   GLUCOSE 108 (H) 02/26/2016 1112   BUN 9 02/26/2016 1112   CREATININE 0.92 02/26/2016 1112  CALCIUM 9.4 02/26/2016 1112   PROT 6.3 03/19/2017 0747   ALBUMIN 4.4 03/19/2017 0747   AST 16 03/19/2017 0747   ALT 20 03/19/2017 0747   ALKPHOS 67 03/19/2017 0747   BILITOT 0.4 03/19/2017 0747    GFRNONAA >60 02/26/2016 1112   GFRAA >60 02/26/2016 1112   Lab Results  Component Value Date   CHOL 127 03/19/2017   HDL 34 (L) 03/19/2017   LDLCALC 74 03/19/2017   TRIG 97 03/19/2017   CHOLHDL 3.7 03/19/2017   No results found for: HGBA1C No results found for: VITAMINB12 No results found for: TSH  No flowsheet data found.   No flowsheet data found.   ASSESSMENT AND PLAN  80 y.o. year old male  has a past medical history of Cancer [facility]), Colon polyp, Coronary artery disease, Deafness in right ear, Esophageal stricture, GERD (gastroesophageal reflux disease), Hyperchloremia, Hypertension, Macular degeneration, Myocardial infarction ([place]) (2007), Ringing in ears, Rt facial numbness, Sacroiliac inflammation ([place]), Sudden blockage of esophagus (07/2017), and Trigeminal neuralgia. here with    Trigeminal neuralgia  Neurogenic pain  Occipital neuritis  Beka is doing well, today. He feels trigeminal and occipital pain are well managed on gabapentin '400mg'$  TID. We will continue current treatment plan. He will continue close follow up with care team. Healthy lifestyle habits encouraged. He will follow up in 1 year, sooner if needed.   No orders of the defined types were placed in this encounter.    No orders of the defined types were placed in this encounter.     Debbora Presto, MSN, FNP-C 08/14/2020, 2:28 PM  [facility] [address], [place], [place] [address] [phone]

## 2020-08-14 NOTE — Patient Instructions (Signed)
Below is our plan:  We will continue gabapentin '400mg'$  four times daily.   Please make sure you are staying well hydrated. I recommend 50-60 ounces daily. Well balanced diet and regular exercise encouraged. Consistent sleep schedule with 6-8 hours recommended.   Please continue follow up with care team as directed.   Follow up with me in 1 year   You may receive a survey regarding today's visit. I encourage you to leave honest feed back as I do use this information to improve patient care. Thank you for seeing me today!

## 2020-08-29 DIAGNOSIS — L603 Nail dystrophy: Secondary | ICD-10-CM | POA: Diagnosis not present

## 2020-08-29 DIAGNOSIS — I739 Peripheral vascular disease, unspecified: Secondary | ICD-10-CM | POA: Diagnosis not present

## 2020-08-29 DIAGNOSIS — L84 Corns and callosities: Secondary | ICD-10-CM | POA: Diagnosis not present

## 2020-09-26 DIAGNOSIS — H43813 Vitreous degeneration, bilateral: Secondary | ICD-10-CM | POA: Diagnosis not present

## 2020-09-26 DIAGNOSIS — H33193 Other retinoschisis and retinal cysts, bilateral: Secondary | ICD-10-CM | POA: Diagnosis not present

## 2020-09-26 DIAGNOSIS — H353133 Nonexudative age-related macular degeneration, bilateral, advanced atrophic without subfoveal involvement: Secondary | ICD-10-CM | POA: Diagnosis not present

## 2020-09-26 DIAGNOSIS — H35373 Puckering of macula, bilateral: Secondary | ICD-10-CM | POA: Diagnosis not present

## 2020-11-16 DIAGNOSIS — N401 Enlarged prostate with lower urinary tract symptoms: Secondary | ICD-10-CM | POA: Diagnosis not present

## 2020-11-24 DIAGNOSIS — R351 Nocturia: Secondary | ICD-10-CM | POA: Diagnosis not present

## 2020-11-24 DIAGNOSIS — Z125 Encounter for screening for malignant neoplasm of prostate: Secondary | ICD-10-CM | POA: Diagnosis not present

## 2020-11-24 DIAGNOSIS — N401 Enlarged prostate with lower urinary tract symptoms: Secondary | ICD-10-CM | POA: Diagnosis not present

## 2020-11-30 DIAGNOSIS — L84 Corns and callosities: Secondary | ICD-10-CM | POA: Diagnosis not present

## 2020-11-30 DIAGNOSIS — I739 Peripheral vascular disease, unspecified: Secondary | ICD-10-CM | POA: Diagnosis not present

## 2020-11-30 DIAGNOSIS — L603 Nail dystrophy: Secondary | ICD-10-CM | POA: Diagnosis not present

## 2020-12-15 DIAGNOSIS — H40013 Open angle with borderline findings, low risk, bilateral: Secondary | ICD-10-CM | POA: Diagnosis not present

## 2020-12-15 DIAGNOSIS — H353124 Nonexudative age-related macular degeneration, left eye, advanced atrophic with subfoveal involvement: Secondary | ICD-10-CM | POA: Diagnosis not present

## 2020-12-15 DIAGNOSIS — H353222 Exudative age-related macular degeneration, left eye, with inactive choroidal neovascularization: Secondary | ICD-10-CM | POA: Diagnosis not present

## 2020-12-15 DIAGNOSIS — H04123 Dry eye syndrome of bilateral lacrimal glands: Secondary | ICD-10-CM | POA: Diagnosis not present

## 2021-01-30 DIAGNOSIS — H35373 Puckering of macula, bilateral: Secondary | ICD-10-CM | POA: Diagnosis not present

## 2021-01-30 DIAGNOSIS — H43813 Vitreous degeneration, bilateral: Secondary | ICD-10-CM | POA: Diagnosis not present

## 2021-01-30 DIAGNOSIS — H353124 Nonexudative age-related macular degeneration, left eye, advanced atrophic with subfoveal involvement: Secondary | ICD-10-CM | POA: Diagnosis not present

## 2021-01-30 DIAGNOSIS — H353113 Nonexudative age-related macular degeneration, right eye, advanced atrophic without subfoveal involvement: Secondary | ICD-10-CM | POA: Diagnosis not present

## 2021-03-01 DIAGNOSIS — I739 Peripheral vascular disease, unspecified: Secondary | ICD-10-CM | POA: Diagnosis not present

## 2021-03-01 DIAGNOSIS — L84 Corns and callosities: Secondary | ICD-10-CM | POA: Diagnosis not present

## 2021-03-01 DIAGNOSIS — L603 Nail dystrophy: Secondary | ICD-10-CM | POA: Diagnosis not present

## 2021-03-29 DIAGNOSIS — K5900 Constipation, unspecified: Secondary | ICD-10-CM | POA: Diagnosis not present

## 2021-04-17 ENCOUNTER — Other Ambulatory Visit: Payer: Self-pay | Admitting: Interventional Cardiology

## 2021-05-07 ENCOUNTER — Encounter: Payer: Self-pay | Admitting: Physician Assistant

## 2021-05-07 ENCOUNTER — Ambulatory Visit: Payer: Medicare Other | Admitting: Physician Assistant

## 2021-05-07 VITALS — BP 94/50 | HR 71 | Ht 72.0 in | Wt 220.0 lb

## 2021-05-07 DIAGNOSIS — I959 Hypotension, unspecified: Secondary | ICD-10-CM

## 2021-05-07 DIAGNOSIS — Z87898 Personal history of other specified conditions: Secondary | ICD-10-CM | POA: Diagnosis not present

## 2021-05-07 DIAGNOSIS — I10 Essential (primary) hypertension: Secondary | ICD-10-CM

## 2021-05-07 DIAGNOSIS — I2510 Atherosclerotic heart disease of native coronary artery without angina pectoris: Secondary | ICD-10-CM

## 2021-05-07 DIAGNOSIS — E785 Hyperlipidemia, unspecified: Secondary | ICD-10-CM

## 2021-05-07 MED ORDER — LISINOPRIL 10 MG PO TABS
10.0000 mg | ORAL_TABLET | Freq: Every day | ORAL | 0 refills | Status: DC
Start: 2021-05-07 — End: 2021-05-22

## 2021-05-07 MED ORDER — ATORVASTATIN CALCIUM 80 MG PO TABS: 80.0000 mg | ORAL_TABLET | Freq: Every day | ORAL | 1 refills | Status: DC

## 2021-05-07 MED ORDER — METOPROLOL SUCCINATE ER 50 MG PO TB24: 50.0000 mg | ORAL_TABLET | Freq: Every day | ORAL | 3 refills | Status: DC

## 2021-05-07 NOTE — Progress Notes (Signed)
? ?Cardiology Office Note   ? ?Date:  05/07/2021  ? ?ID:  Billy Knox, DOB [birth year], MRN [id number] ? ?PCP:  Cari Caraway, MD  ?Cardiologist:  Larae Grooms, MD  ?Electrophysiologist:  None  ? ?Chief Complaint: f/u CAD ? ?History of Present Illness:  ? ?Billy Knox is a 81 y.o. male with history of CAD (CTO of RCA), trigeminal neuralgia s/p gamma knife surgery, esophageal dilation with relief of chest pain in 2016, macular degeneration, HTN, HLD, GERD who is seen for follow-up.  ? ?Per Dr. Hassell Done note, he has history of CAD with CTO of RCA diagnosed many years ago with L-R collaterals, managed medically. Cath report not available in EMR. He had episode of CP in 12/2014 with Schatzki's ring diagnosed at that time with relief of pain with dilation. In early 2018, he had a syncopal episode after neurosurgery in 11/17 (Nucleus Caudalis Dorsal root entry zone, for facial pain from trigeminal neuralgia). He had a broken left eye socket. He thinks he was overmedicated with his neurologic medications. Since reducing the dosages, he has not had any further syncope.  ? ?He is seen back for follow-up today doing great. He walks 3 miles a day and ballroom dances once a week without any exertional angina or dyspnea. He has rare fleeting chest or jaw discomfort less than once a month, not provoked by exertion, no recent change in symptoms - historically states it's been hard to sort out whether this was from his trigeminal neuralgia. One baby aspirin relieves the symptoms. He is overall pleased to report no new cardiac symptoms. He is an avid book reader and listens to them through an app that connects to his hearing aid. Initial BP by MA was 108/70. Recheck Dynamap was 92/50 on the right and 94/50 on the left. He is completely asymptomatic with this. Took his meds earlier today. ? ?Labwork independently reviewed: ? ?05/2020 A1c 5.9, BUn 17, Cr 0.78, K 4.2, Cr 0.780, HDL 31, LDL 54, Hgb 14.8, plt not listed ?2019  LFTs, HDL 34, LDL 74 ?2018 Hgb 14.6, plt 183, K 4.1, Cr 0.92, AST/ALT OK ?No TSH ? ?Cardiology Studies:  ? ?Studies reviewed are outlined and summarized above. Reports included below if pertinent.  ? ?N/A  ? ? ?Past Medical History:  ?Diagnosis Date  ? Cancer [facility])   ? 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 Colon polyp   ? Coronary artery disease   ? Deafness in right ear   ? Esophageal stricture   ? GERD (gastroesophageal reflux disease)   ? past history no recent problems  ? Hyperchloremia   ? Hypertension   ? Macular degeneration   ? Myocardial infarction [facility]) 2007  ? hx. silent MI - no intervention except oral meds   ? Ringing in ears   ? right  ? Rt facial numbness   ? Sacroiliac inflammation ([place])   ? 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 Trigeminal neuralgia   ? ? ?Past Surgical History:  ?Procedure Laterality Date  ? CYSTOSCOPY    ? ESOPHAGOGASTRODUODENOSCOPY    ? ESOPHAGOGASTRODUODENOSCOPY  06/14/2011  ? Procedure: ESOPHAGOGASTRODUODENOSCOPY (EGD);  Surgeon: Winfield Cunas., MD;  Location: [facility];  Service: Endoscopy;  Laterality: N/A;  ? ESOPHAGOGASTRODUODENOSCOPY N/A 07/28/2017  ? Procedure: ESOPHAGOGASTRODUODENOSCOPY (EGD);  Surgeon: Laurence Spates, MD;  Location: [facility];  Service: Endoscopy;  Laterality: N/A;  ? ESOPHAGOGASTRODUODENOSCOPY (EGD)  WITH PROPOFOL N/A 08/12/2014  ? Procedure: ESOPHAGOGASTRODUODENOSCOPY (EGD) WITH PROPOFOL;  Surgeon: Laurence Spates, MD;  [facility];  Service: Endoscopy;  Laterality: N/A;  ? ESOPHAGOGASTRODUODENOSCOPY (EGD) WITH PROPOFOL N/A 10/29/2016  ? Procedure: ESOPHAGOGASTRODUODENOSCOPY (EGD) WITH PROPOFOL;  Surgeon: Laurence Spates, MD;  [facility];  Service: Endoscopy;  Laterality: N/A;  ? FOREIGN BODY REMOVAL  07/28/2017  ? Procedure: FOREIGN BODY REMOVAL;  Surgeon: Laurence Spates, MD;  [facility];  Service: Endoscopy;;  ? gamma knife    ? 3 yrs ago  [place] area around trigeminal nerve area"  ? PROSTATE BIOPSY    ? x 3  ? SAVORY DILATION  06/14/2011  ? Procedure: SAVORY DILATION;  Surgeon: Winfield Cunas., MD;  Location: [facility];  Service: Endoscopy;  Laterality: N/A;  need xray  ? SAVORY DILATION N/A 08/12/2014  ? Procedure: SAVORY DILATION;  Surgeon: Laurence Spates, MD;  [facility];  Service: Endoscopy;  Laterality: N/A;  ? SAVORY DILATION N/A 10/29/2016  ? Procedure: SAVORY DILATION;  Surgeon: Laurence Spates, MD;  [facility];  Service: Endoscopy;  Laterality: N/A;  ? TONSILLECTOMY    ?  child  ? TRIGEMINAL NERVE BALLOON DECOMPRESSION Right   ? right ear deafness x 4 total  ? ? ?Current Medications: ?Current Meds  ?Medication Sig  ? acetaminophen (TYLENOL) 500 MG tablet Take 1,000 mg by mouth every 6 (six) hours as needed for mild pain.  ? aspirin 81 MG tablet Take 81 mg by mouth daily.  ? atorvastatin (LIPITOR) 80 MG tablet TAKE 1 TABLET BY MOUTH ONCE DAILY AT  6  PM  ? finasteride (PROSCAR) 5 MG tablet Take 5 mg by mouth daily.  ? gabapentin (NEURONTIN) 400 MG capsule TAKE 1 CAPSULE BY MOUTH 4 TIMES DAILY (EVERY 5 HOURS)  ? Glucosamine HCl (GLUCOSAMINE PO) Take 1,000 mg by mouth daily.  ? LINZESS 145 MCG CAPS capsule Take 145 mcg by mouth every morning.  ? lisinopril (ZESTRIL) 20 MG tablet Take 1 tablet (20 mg total) by mouth daily.  ? metoprolol succinate (TOPROL-XL) 50 MG 24 hr tablet Take 1 tablet (50 mg total) by mouth daily.  ? Multiple Vitamin (MULTIVITAMIN WITH MINERALS) TABS tablet Take 1 tablet by mouth daily.  ? neomycin-polymyxin b-dexamethasone (MAXITROL) 3.5-10000-0.1 OINT Place 1 application into the right eye 3 (three) times a week.  ? nitroGLYCERIN (NITROSTAT) 0.4 MG SL tablet Place 1 tablet (0.4 mg total) under the tongue every 5 (five) minutes as needed for chest pain.  ? Omega-3 Fatty Acids (FISH OIL) 1200 MG CAPS Take 4,800 mg by mouth 2 (two) times daily.   ? Omeprazole (PRILOSEC PO) Take 40 mg by  mouth every evening.  ? tamsulosin (FLOMAX) 0.4 MG CAPS capsule Take 1 capsule by mouth daily.  ?  ? ?Allergies:   Patient has no known allergies.  ? ?Social History  ? ?Socioeconomic History  ? Marital status: Married  ?  Spouse name: Not on file  ? Number of children: 1  ? Years of education: 76  ? Highest education level: Not on file  ?Occupational History  ? Occupation: Retired  ?Tobacco Use  ? Smoking status: Former  ?  Types: Cigarettes  ?  Quit date: 06/14/1967  ?  Years since quitting: 53.9  ? Smokeless tobacco: Never  ?Vaping Use  ? Vaping Use: Never used  ?Substance and Sexual Activity  ? Alcohol use: No  ?  Alcohol/week: 1.0 standard drink  ?  Types: 1  Standard drinks or equivalent per week  ? Drug use: No  ? Sexual activity: Not on file  ?Other Topics Concern  ? Not on file  ?Social History Narrative  ? Lives at home w/ his wife  ? Right-handed  ? Caffeine: 2 cups per day  ? ?Social Determinants of Health  ? ?Financial Resource Strain: Not on file  ?Food Insecurity: Not on file  ?Transportation Needs: Not on file  ?Physical Activity: Not on file  ?Stress: Not on file  ?Social Connections: Not on file  ?  ? ?Family History:  ?The patient's family history includes Alcohol abuse in his father; Colon cancer in his mother; Heart disease in his mother; Liver cancer in his sister. ? ?ROS:   ?Please see the history of present illness.  ?All other systems are reviewed and otherwise negative.  ? ? ?EKG(s)/Additional Labs  ? ?EKG:  EKG is ordered today, personally reviewed, demonstrating NSR 71bpm, prior inferior Q's III, avF, no acute STT changes ? ?Recent Labs: ?No results found for requested labs within last 8760 hours.  ?Recent Lipid Panel ?   ?Component Value Date/Time  ? CHOL 127 03/19/2017 0747  ? TRIG 97 03/19/2017 0747  ? HDL 34 (L) 03/19/2017 0747  ? CHOLHDL 3.7 03/19/2017 0747  ? CHOLHDL 3.9 09/08/2015 0734  ? VLDL 28 09/08/2015 0734  ? [place] 74 03/19/2017 0747  ? ? ?PHYSICAL EXAM:   ? ?VS:  BP 108/70    Pulse 71   Ht 6' (1.829 m)   Wt 220 lb (99.8 kg)   SpO2 96%   BMI 29.84 kg/m?   BMI: Body mass index is 29.84 kg/m?. ? ?GEN: Well nourished, well developed male in no acute distress ?HEENT: normocepha

## 2021-05-07 NOTE — Patient Instructions (Signed)
Medication Instructions:  ?SKIP Lisinopril today and tomorrow ?RESTART Lisinopril '10mg'$  daily (you may cut your '20mg'$  tablet in half) start on Wednesday  ?*If you need a refill on your cardiac medications before your next appointment, please call your pharmacy* ? ? ?Lab Work: ?TODAY-CMET, CBC, TSH ?If you have labs (blood work) drawn today and your tests are completely normal, you will receive your results only by: ?MyChart Message (if you have MyChart) OR ?A paper copy in the mail ?If you have any lab test that is abnormal or we need to change your treatment, we will call you to review the results. ? ? ?Testing/Procedures: ?Your physician has requested that you have an echocardiogram. Echocardiography is a painless test that uses sound waves to create images of your heart. It provides your doctor with information about the size and shape of your heart and how well your heart?s chambers and valves are working. This procedure takes approximately one hour. There are no restrictions for this procedure. ? ? ?Follow-Up: ?At [facility], you and your health needs are our priority.  As part of our continuing mission to provide you with exceptional heart care, we have created designated Provider Care Teams.  These Care Teams include your primary Cardiologist (physician) and Advanced Practice Providers (APPs -  Physician Assistants and Nurse Practitioners) who all work together to provide you with the care you need, when you need it. ? ?We recommend signing up for the patient portal called "MyChart".  Sign up information is provided on this After Visit Summary.  MyChart is used to connect with patients for Virtual Visits (Telemedicine).  Patients are able to view lab/test results, encounter notes, upcoming appointments, etc.  Non-urgent messages can be sent to your provider as well.   ?To learn more about what you can do with MyChart, go to [email or web address].   ? ?Your next appointment:   ?2 week(s) ? ?The format for  your next appointment:   ?In Person ? ?Provider:   ?Melina Copa, PA   ? ? ?Other Instructions ?Call Thursday afternoon with your blood pressure readings ? ?Important Information About Sugar ? ? ? ? ?  ?

## 2021-05-08 LAB — COMPREHENSIVE METABOLIC PANEL
ALT: 14 IU/L (ref 0–44)
AST: 14 IU/L (ref 0–40)
Albumin/Globulin Ratio: 1.2 (ref 1.2–2.2)
Albumin: 3.3 g/dL — ABNORMAL LOW (ref 3.7–4.7)
Alkaline Phosphatase: 122 IU/L — ABNORMAL HIGH (ref 44–121)
BUN/Creatinine Ratio: 10 (ref 10–24)
BUN: 10 mg/dL (ref 8–27)
Bilirubin Total: 0.5 mg/dL (ref 0.0–1.2)
CO2: 20 mmol/L (ref 20–29)
Calcium: 8.3 mg/dL — ABNORMAL LOW (ref 8.6–10.2)
Chloride: 107 mmol/L — ABNORMAL HIGH (ref 96–106)
Creatinine, Ser: 0.98 mg/dL (ref 0.76–1.27)
Globulin, Total: 2.8 g/dL (ref 1.5–4.5)
Glucose: 110 mg/dL — ABNORMAL HIGH (ref 70–99)
Potassium: 3.8 mmol/L (ref 3.5–5.2)
Sodium: 143 mmol/L (ref 134–144)
Total Protein: 6.1 g/dL (ref 6.0–8.5)
eGFR: 78 mL/min/{1.73_m2} (ref >59)

## 2021-05-08 LAB — CBC
Hematocrit: 42.8 % (ref 37.5–51.0)
Hemoglobin: 14.4 g/dL (ref 13.0–17.7)
MCH: 31.3 pg (ref 26.6–33.0)
MCHC: 33.6 g/dL (ref 31.5–35.7)
MCV: 93 fL (ref 79–97)
Platelets: 188 10*3/uL (ref 150–450)
RBC: 4.6 x10E6/uL (ref 4.14–5.80)
RDW: 12.4 % (ref 11.6–15.4)
WBC: 8.2 10*3/uL (ref 3.4–10.8)

## 2021-05-08 LAB — TSH: TSH: 4.05 u[IU]/mL (ref 0.450–4.500)

## 2021-05-10 ENCOUNTER — Telehealth: Payer: Self-pay | Admitting: Physician Assistant

## 2021-05-10 NOTE — Telephone Encounter (Signed)
Great news. Continue lower dose of lisinopril as discussed. Keep an eye on BP and let us know if any low readings again. Bring a log to the next visit - keep f/u as planned. ?

## 2021-05-10 NOTE — Telephone Encounter (Signed)
Patient called to give his BP readings: ? ?4/25 120/72 ?4/26 134/77 ?4/27 120/73 ? ?All taken on the left arm above the elbow.  ?

## 2021-05-15 ENCOUNTER — Other Ambulatory Visit: Payer: Self-pay | Admitting: Interventional Cardiology

## 2021-05-15 NOTE — Telephone Encounter (Signed)
Patient notified and voiced understanding.

## 2021-05-18 ENCOUNTER — Ambulatory Visit (HOSPITAL_COMMUNITY): Payer: Medicare Other | Attending: Cardiology

## 2021-05-18 DIAGNOSIS — I2510 Atherosclerotic heart disease of native coronary artery without angina pectoris: Secondary | ICD-10-CM | POA: Diagnosis not present

## 2021-05-18 DIAGNOSIS — I10 Essential (primary) hypertension: Secondary | ICD-10-CM

## 2021-05-18 DIAGNOSIS — E785 Hyperlipidemia, unspecified: Secondary | ICD-10-CM | POA: Diagnosis not present

## 2021-05-18 DIAGNOSIS — Z87898 Personal history of other specified conditions: Secondary | ICD-10-CM

## 2021-05-18 DIAGNOSIS — I959 Hypotension, unspecified: Secondary | ICD-10-CM | POA: Diagnosis not present

## 2021-05-18 LAB — ECHOCARDIOGRAM COMPLETE
Area-P 1/2: 2.29 cm2
S' Lateral: 3.2 cm

## 2021-05-19 NOTE — Progress Notes (Signed)
? ?Cardiology Office Note   ? ?Date:  05/22/2021  ? ?ID:  CHAYNCE Knox, DOB 06-25-1940, MRN [id number] ? ?PCP:  Cari Caraway, MD  ?Cardiologist:  Larae Grooms, MD  ?Electrophysiologist:  None  ? ?Chief Complaint: f/u blood pressure ? ?History of Present Illness:  ? ?Billy Knox is a 81 y.o. male with history of CAD (CTO of RCA), trigeminal neuralgia s/p gamma knife surgery, esophageal dilation with relief of chest pain in 2016, macular degeneration, HTN, HLD, GERD who is seen for follow-up.  ?  ?Per Dr. Hassell Done note, he has history of CAD with CTO of RCA diagnosed many years ago with L-R collaterals, managed medically. Cath report not available in EMR. He had episode of CP in 12/2014 with Schatzki's ring diagnosed at that time with relief of pain with dilation. In early 2018, he had a syncopal episode after neurosurgery in 11/17 (Nucleus Caudalis Dorsal root entry zone, for facial pain from trigeminal neuralgia). He had a broken left eye socket. He thinks he was overmedicated with his neurologic medications. Since reducing the dosages, he has not had any further syncope. He was seen in follow-up 05/07/21 and doing well though found to be borderline hypotensive with SBP in the 90s. He was asymptomatic so we checked labs and decreased his lisinopril to 73m daily. 2D echo showed EF 55-60%, g1DD, mild AI. He enjoys audiobooks that sync to his hearing aid. ? ?He returns for follow-up overall feeling well. His blood pressure has improved and he remains asymptomatic. No CP, SOB, dizziness, presyncope or syncope. No palpitations. His blood pressure cuff seems to correlate with office reading once calibrated. ? ? ?Labwork independently reviewed: ?05/07/21 TSH wnl, CBC wnl, glu 110, Cl 107, calcium 8.3, albumin 3.3, alk phos 122, ALT/ALT OK ?05/2020 A1c 5.9, BUN 17, Cr 0.78, K 4.2, Cr 0.780, HDL 31, LDL 54, Hgb 14.8, plt not listed ?2019 LFTs, HDL 34, LDL 74 ?2018 Hgb 14.6, plt 183, K 4.1, Cr 0.92, AST/ALT  OK ? ? ? ?Cardiology Studies:  ? ?Studies reviewed are outlined and summarized above. Reports included below if pertinent.  ? ?2D echo 05/18/21 ? ? 1. Left ventricular ejection fraction, by estimation, is 55 to 60%. The  ?left ventricle has normal function. The left ventricle has no regional  ?wall motion abnormalities. Left ventricular diastolic parameters are  ?consistent with Grade I diastolic  ?dysfunction (impaired relaxation).  ? 2. Right ventricular systolic function is normal. The right ventricular  ?size is normal. Tricuspid regurgitation signal is inadequate for assessing  ?PA pressure.  ? 3. The mitral valve is degenerative. Trivial mitral valve regurgitation.  ?The mean mitral valve gradient is 2.6 mmHg with average heart rate of 64  ?bpm.  ? 4. The aortic valve is tricuspid. Aortic valve regurgitation is mild.  ? ?Comparison(s): No prior Echocardiogram.   ? ? ?Past Medical History:  ?Diagnosis Date  ? Cancer ([facility]   ? 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 Colon polyp   ? Coronary artery disease   ? Deafness in right ear   ? Esophageal stricture   ? GERD (gastroesophageal reflux disease)   ? past history no recent problems  ? Hyperchloremia   ? Hypertension   ? Macular degeneration   ? Myocardial infarction ([facility] 2007  ? hx. silent MI - no intervention except oral meds   ? Ringing in ears   ? right  ? Rt facial numbness   ? Sacroiliac inflammation (  [place])   ? 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 Trigeminal neuralgia   ? ? ?Past Surgical History:  ?Procedure Laterality Date  ? CYSTOSCOPY    ? ESOPHAGOGASTRODUODENOSCOPY    ? ESOPHAGOGASTRODUODENOSCOPY  06/14/2011  ? Procedure: ESOPHAGOGASTRODUODENOSCOPY (EGD);  Surgeon: Winfield Cunas., MD;  Location: [facility];  Service: Endoscopy;  Laterality: N/A;  ? ESOPHAGOGASTRODUODENOSCOPY N/A 07/28/2017  ? Procedure: ESOPHAGOGASTRODUODENOSCOPY (EGD);  Surgeon: Laurence Spates, MD;   Location: [facility];  Service: Endoscopy;  Laterality: N/A;  ? ESOPHAGOGASTRODUODENOSCOPY (EGD) WITH PROPOFOL N/A 08/12/2014  ? Procedure: ESOPHAGOGASTRODUODENOSCOPY (EGD) WITH PROPOFOL;  Surgeon: Laurence Spates, MD;  Location: [facility]OSCOPY;  Service: Endoscopy;  Laterality: N/A;  ? ESOPHAGOGASTRODUODENOSCOPY (EGD) WITH PROPOFOL N/A 10/29/2016  ? Procedure: ESOPHAGOGASTRODUODENOSCOPY (EGD) WITH PROPOFOL;  Surgeon: Laurence Spates, MD;  Location: [facility];  Service: Endoscopy;  Laterality: N/A;  ? FOREIGN BODY REMOVAL  07/28/2017  ? Procedure: FOREIGN BODY REMOVAL;  Surgeon: Laurence Spates, MD;  Location: [facility]OS[facility];  Service: Endoscopy;;  ? gamma knife    ? 3 yrs ago [place] area around trigeminal nerve area"  ? PROSTATE BIOPSY    ? x 3  ? SAVORY DILATION  06/14/2011  ? Procedure: SAVORY DILATION;  Surgeon: Winfield Cunas., MD;  Location: [facility];  Service: Endoscopy;  Laterality: N/A;  need xray  ? SAVORY DILATION N/A 08/12/2014  ? Procedure: SAVORY DILATION;  Surgeon: Laurence Spates, MD;  [facility];  Service: Endoscopy;  Laterality: N/A;  ? SAVORY DILATION N/A 10/29/2016  ? Procedure: SAVORY DILATION;  Surgeon: Laurence Spates, MD;  [facility];  Service: Endoscopy;  Laterality: N/A;  ? TONSILLECTOMY    ?  child  ? TRIGEMINAL NERVE BALLOON DECOMPRESSION Right   ? right ear deafness x 4 total  ? ? ?Current Medications: ?Current Meds  ?Medication Sig  ? acetaminophen (TYLENOL) 500 MG tablet Take 1,000 mg by mouth every 6 (six) hours as needed for mild pain.  ? aspirin 81 MG tablet Take 81 mg by mouth daily.  ? atorvastatin (LIPITOR) 80 MG tablet Take 1 tablet (80 mg total) by mouth daily.  ? finasteride (PROSCAR) 5 MG tablet Take 5 mg by mouth daily.  ? gabapentin (NEURONTIN) 400 MG capsule TAKE 1 CAPSULE BY MOUTH 4 TIMES DAILY (EVERY 5 HOURS)  ? Glucosamine HCl (GLUCOSAMINE PO) Take 1,000 mg by mouth daily.  ? LINZESS 145 MCG CAPS capsule Take 145 mcg by mouth every morning.   ? lisinopril (ZESTRIL) 10 MG tablet Take 1 tablet (10 mg total) by mouth daily.  ? metoprolol succinate (TOPROL-XL) 50 MG 24 hr tablet Take 1 tablet (50 mg total) by mouth daily.  ? Multiple Vitamin (MULTIVITAMIN WITH MINERALS) TABS tablet Take 1 tablet by mouth daily.  ? neomycin-polymyxin b-dexamethasone (MAXITROL) 3.5-10000-0.1 OINT Place 1 application into the right eye 3 (three) times a week.  ? nitroGLYCERIN (NITROSTAT) 0.4 MG SL tablet Place 1 tablet (0.4 mg total) under the tongue every 5 (five) minutes as needed for chest pain.  ? Omega-3 Fatty Acids (FISH OIL) 1200 MG CAPS Take 4,800 mg by mouth 2 (two) times daily.   ? Omeprazole (PRILOSEC PO) Take 40 mg by mouth every evening.  ? tamsulosin (FLOMAX) 0.4 MG CAPS capsule Take 1 capsule by mouth daily.  ? ?  ? ?Allergies:   Patient has no known allergies.  ? ?Social History  ? ?Socioeconomic History  ? Marital status:  Married  ?  Spouse name: Not on file  ? Number of children: 1  ? Years of education: 26  ? Highest education level: Not on file  ?Occupational History  ? Occupation: Retired  ?Tobacco Use  ? Smoking status: Former  ?  Types: Cigarettes  ?  Quit date: 06/14/1967  ?  Years since quitting: 53.9  ? Smokeless tobacco: Never  ?Vaping Use  ? Vaping Use: Never used  ?Substance and Sexual Activity  ? Alcohol use: No  ?  Alcohol/week: 1.0 standard drink  ?  Types: 1 Standard drinks or equivalent per week  ? Drug use: No  ? Sexual activity: Not on file  ?Other Topics Concern  ? Not on file  ?Social History Narrative  ? Lives at home w/ his wife  ? Right-handed  ? Caffeine: 2 cups per day  ? ?Social Determinants of Health  ? ?Financial Resource Strain: Not on file  ?Food Insecurity: Not on file  ?Transportation Needs: Not on file  ?Physical Activity: Not on file  ?Stress: Not on file  ?Social Connections: Not on file  ?  ? ?Family History:  ?The patient's family history includes Alcohol abuse in his father; Colon cancer in his mother; Heart disease in his  mother; Liver cancer in his sister. ? ?ROS:   ?Please see the history of present illness.  ?All other systems are reviewed and otherwise negative.  ? ? ?EKG(s)/Additional Labs  ? ?EKG:  EKG is not ordered today ?

## 2021-05-22 ENCOUNTER — Encounter: Payer: Self-pay | Admitting: Physician Assistant

## 2021-05-22 ENCOUNTER — Ambulatory Visit: Payer: Medicare Other | Admitting: Physician Assistant

## 2021-05-22 VITALS — BP 122/72 | HR 64 | Ht 72.0 in | Wt 220.0 lb

## 2021-05-22 DIAGNOSIS — I10 Essential (primary) hypertension: Secondary | ICD-10-CM

## 2021-05-22 DIAGNOSIS — I351 Nonrheumatic aortic (valve) insufficiency: Secondary | ICD-10-CM | POA: Diagnosis not present

## 2021-05-22 DIAGNOSIS — Z87898 Personal history of other specified conditions: Secondary | ICD-10-CM

## 2021-05-22 DIAGNOSIS — I959 Hypotension, unspecified: Secondary | ICD-10-CM | POA: Diagnosis not present

## 2021-05-22 DIAGNOSIS — I2510 Atherosclerotic heart disease of native coronary artery without angina pectoris: Secondary | ICD-10-CM

## 2021-05-22 DIAGNOSIS — R739 Hyperglycemia, unspecified: Secondary | ICD-10-CM | POA: Diagnosis not present

## 2021-05-22 DIAGNOSIS — E785 Hyperlipidemia, unspecified: Secondary | ICD-10-CM

## 2021-05-22 LAB — COMPREHENSIVE METABOLIC PANEL
ALT: 24 IU/L (ref 0–44)
AST: 19 IU/L (ref 0–40)
Albumin/Globulin Ratio: 2.2 (ref 1.2–2.2)
Albumin: 4 g/dL (ref 3.7–4.7)
Alkaline Phosphatase: 56 IU/L (ref 44–121)
BUN/Creatinine Ratio: 15 (ref 10–24)
BUN: 13 mg/dL (ref 8–27)
Bilirubin Total: 0.7 mg/dL (ref 0.0–1.2)
CO2: 27 mmol/L (ref 20–29)
Calcium: 8.8 mg/dL (ref 8.6–10.2)
Chloride: 103 mmol/L (ref 96–106)
Creatinine, Ser: 0.86 mg/dL (ref 0.76–1.27)
Globulin, Total: 1.8 g/dL (ref 1.5–4.5)
Glucose: 101 mg/dL — ABNORMAL HIGH (ref 70–99)
Potassium: 4.2 mmol/L (ref 3.5–5.2)
Sodium: 141 mmol/L (ref 134–144)
Total Protein: 5.8 g/dL — ABNORMAL LOW (ref 6.0–8.5)
eGFR: 88 mL/min/{1.73_m2} (ref >59)

## 2021-05-22 MED ORDER — LISINOPRIL 10 MG PO TABS: 10.0000 mg | ORAL_TABLET | Freq: Every day | ORAL | 3 refills | Status: DC

## 2021-05-22 NOTE — Patient Instructions (Signed)
Medication Instructions:  ?Your physician recommends that you continue on your current medications as directed. Please refer to the Current Medication list given to you today. ?*If you need a refill on your cardiac medications before your next appointment, please call your pharmacy* ? ? ?Lab Work: ?TODAY-CMET ?If you have labs (blood work) drawn today and your tests are completely normal, you will receive your results only by: ?MyChart Message (if you have MyChart) OR ?A paper copy in the mail ?If you have any lab test that is abnormal or we need to change your treatment, we will call you to review the results. ? ? ?Testing/Procedures: ?NONE ORDERED ? ? ?Follow-Up: ?At [facility], you and your health needs are our priority.  As part of our continuing mission to provide you with exceptional heart care, we have created designated Provider Care Teams.  These Care Teams include your primary Cardiologist (physician) and Advanced Practice Providers (APPs -  Physician Assistants and Nurse Practitioners) who all work together to provide you with the care you need, when you need it. ? ?We recommend signing up for the patient portal called "MyChart".  Sign up information is provided on this After Visit Summary.  MyChart is used to connect with patients for Virtual Visits (Telemedicine).  Patients are able to view lab/test results, encounter notes, upcoming appointments, etc.  Non-urgent messages can be sent to your provider as well.   ?To learn more about what you can do with MyChart, go to [email or web address].   ? ?Your next appointment:   ?1 year(s) ? ?The format for your next appointment:   ?In Person ? ?Provider:   ?Larae Grooms, MD   ? ? ?Other Instructions ? ? ?Important Information About Sugar ? ? ? ? ?  ?

## 2021-05-31 ENCOUNTER — Other Ambulatory Visit: Payer: Self-pay

## 2021-05-31 ENCOUNTER — Encounter (HOSPITAL_COMMUNITY): Admission: EM | Disposition: A | Discharge status: Home / Self Care | Payer: Self-pay | Source: Home / Self Care

## 2021-05-31 ENCOUNTER — Ambulatory Visit (HOSPITAL_COMMUNITY)
Admission: EM | Admit: 2021-05-31 | Discharge: 2021-05-31 | Disposition: A | Discharge status: Home / Self Care | Payer: Medicare Other | Attending: Gastroenterology | Admitting: Gastroenterology

## 2021-05-31 ENCOUNTER — Emergency Department (EMERGENCY_DEPARTMENT_HOSPITAL): Payer: Medicare Other | Admitting: Certified Registered Nurse Anesthetist

## 2021-05-31 ENCOUNTER — Encounter (HOSPITAL_COMMUNITY): Payer: Self-pay

## 2021-05-31 ENCOUNTER — Emergency Department (HOSPITAL_COMMUNITY): Payer: Medicare Other | Admitting: Certified Registered Nurse Anesthetist

## 2021-05-31 DIAGNOSIS — T18128A Food in esophagus causing other injury, initial encounter: Secondary | ICD-10-CM

## 2021-05-31 DIAGNOSIS — Z6828 Body mass index (BMI) 28.0-28.9, adult: Secondary | ICD-10-CM | POA: Diagnosis not present

## 2021-05-31 DIAGNOSIS — E785 Hyperlipidemia, unspecified: Secondary | ICD-10-CM | POA: Diagnosis not present

## 2021-05-31 DIAGNOSIS — X58XXXA Exposure to other specified factors, initial encounter: Secondary | ICD-10-CM | POA: Insufficient documentation

## 2021-05-31 DIAGNOSIS — K219 Gastro-esophageal reflux disease without esophagitis: Secondary | ICD-10-CM | POA: Diagnosis not present

## 2021-05-31 DIAGNOSIS — R1310 Dysphagia, unspecified: Secondary | ICD-10-CM

## 2021-05-31 DIAGNOSIS — E669 Obesity, unspecified: Secondary | ICD-10-CM | POA: Diagnosis not present

## 2021-05-31 DIAGNOSIS — I739 Peripheral vascular disease, unspecified: Secondary | ICD-10-CM | POA: Diagnosis not present

## 2021-05-31 DIAGNOSIS — I2510 Atherosclerotic heart disease of native coronary artery without angina pectoris: Secondary | ICD-10-CM | POA: Diagnosis not present

## 2021-05-31 DIAGNOSIS — I10 Essential (primary) hypertension: Secondary | ICD-10-CM | POA: Diagnosis not present

## 2021-05-31 DIAGNOSIS — Z87891 Personal history of nicotine dependence: Secondary | ICD-10-CM | POA: Diagnosis not present

## 2021-05-31 DIAGNOSIS — I252 Old myocardial infarction: Secondary | ICD-10-CM | POA: Diagnosis not present

## 2021-05-31 DIAGNOSIS — K222 Esophageal obstruction: Secondary | ICD-10-CM | POA: Insufficient documentation

## 2021-05-31 DIAGNOSIS — T182XXA Foreign body in stomach, initial encounter: Secondary | ICD-10-CM

## 2021-05-31 DIAGNOSIS — L84 Corns and callosities: Secondary | ICD-10-CM | POA: Diagnosis not present

## 2021-05-31 DIAGNOSIS — L603 Nail dystrophy: Secondary | ICD-10-CM | POA: Diagnosis not present

## 2021-05-31 HISTORY — PX: FOREIGN BODY REMOVAL: SHX962

## 2021-05-31 HISTORY — PX: ESOPHAGOGASTRODUODENOSCOPY (EGD) WITH PROPOFOL: SHX5813

## 2021-05-31 SURGERY — ESOPHAGOGASTRODUODENOSCOPY (EGD) WITH PROPOFOL
Anesthesia: General

## 2021-05-31 MED ORDER — LACTATED RINGERS IV SOLN: INTRAVENOUS | Status: DC | PRN

## 2021-05-31 MED ORDER — DEXAMETHASONE SODIUM PHOSPHATE 10 MG/ML IJ SOLN
INTRAMUSCULAR | Status: DC | PRN
  Administered 2021-05-31: 4 mg via INTRAVENOUS

## 2021-05-31 MED ORDER — LIDOCAINE 2% (20 MG/ML) 5 ML SYRINGE
INTRAMUSCULAR | Status: DC | PRN
Start: 2021-05-31 — End: 2021-05-31
  Administered 2021-05-31: 80 mg via INTRAVENOUS

## 2021-05-31 MED ORDER — SUCCINYLCHOLINE CHLORIDE 200 MG/10ML IV SOSY
PREFILLED_SYRINGE | INTRAVENOUS | Status: DC | PRN
Start: 2021-05-31 — End: 2021-05-31
  Administered 2021-05-31: 120 mg via INTRAVENOUS

## 2021-05-31 MED ORDER — EPHEDRINE SULFATE-NACL 50-0.9 MG/10ML-% IV SOSY
PREFILLED_SYRINGE | INTRAVENOUS | Status: DC | PRN
  Administered 2021-05-31: 10 mg via INTRAVENOUS

## 2021-05-31 MED ORDER — SODIUM CHLORIDE 0.9 % IV SOLN: INTRAVENOUS | Status: DC

## 2021-05-31 MED ORDER — ONDANSETRON HCL 4 MG/2ML IJ SOLN: 4.0000 mg | Freq: Once | INTRAMUSCULAR | Status: DC | PRN

## 2021-05-31 MED ORDER — PROPOFOL 10 MG/ML IV BOLUS
INTRAVENOUS | Status: DC | PRN
  Administered 2021-05-31: 130 mg via INTRAVENOUS

## 2021-05-31 MED ORDER — ONDANSETRON HCL 4 MG/2ML IJ SOLN
INTRAMUSCULAR | Status: DC | PRN
Start: 2021-05-31 — End: 2021-05-31
  Administered 2021-05-31: 4 mg via INTRAVENOUS

## 2021-05-31 SURGICAL SUPPLY — 15 items

## 2021-05-31 NOTE — Anesthesia Procedure Notes (Signed)
Procedure Name: Intubation Date/Time: 05/31/2021 8:52 PM Performed by: Rosaland Lao, CRNA Pre-anesthesia Checklist: Patient identified, Emergency Drugs available, Suction available and Patient being monitored Patient Re-evaluated:Patient Re-evaluated prior to induction Oxygen Delivery Method: Circle system utilized Preoxygenation: Pre-oxygenation with 100% oxygen Induction Type: IV induction and Rapid sequence Laryngoscope Size: Mac and 4 Grade View: Grade I Tube type: Oral Tube size: 7.0 mm Number of attempts: 1 Airway Equipment and Method: Stylet Placement Confirmation: ETT inserted through vocal cords under direct vision, positive ETCO2 and breath sounds checked- equal and bilateral Secured at: 22 cm Tube secured with: Tape Dental Injury: Teeth and Oropharynx as per pre-operative assessment

## 2021-05-31 NOTE — Transfer of Care (Signed)
Immediate Anesthesia Transfer of Care Note  Patient: Billy Knox  Procedure(s) Performed: ESOPHAGOGASTRODUODENOSCOPY (EGD) WITH PROPOFOL FOREIGN BODY REMOVAL  Patient Location: PACU  Anesthesia Type:General  Level of Consciousness: awake, alert  and oriented  Airway & Oxygen Therapy: Patient Spontanous Breathing and Patient connected to face mask  Post-op Assessment: Report given to RN and Post -op Vital signs reviewed and stable  Post vital signs: Reviewed and stable  Last Vitals:  Vitals Value Taken Time  BP    Temp    Pulse    Resp    SpO2      Last Pain:  Vitals:   05/31/21 2020  TempSrc: Temporal  PainSc: 6          Complications: No notable events documented.

## 2021-05-31 NOTE — Consult Note (Signed)
Referring Provider: ER Primary Care Physician:  Cari Caraway, MD Primary Gastroenterologist:  Dr. Michail Sermon  Reason for Consultation:  Food Impaction  HPI: Billy Knox is a 81 y.o. male who felt food hang up this evening while eating chicken pot pie and has been unable to swallow saliva or liquids since then. He has had some vomiting since the food hung up. He is unsure how much food he swallowed. Food impaction in 2019 and reports intermittent mild dysphagia since then. History of esophageal stricture with dilations in the past. History of GERD.  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   Sudden blockage of esophagus 07/2017   Trigeminal neuralgia     Past Surgical History:  Procedure Laterality Date   CYSTOSCOPY     ESOPHAGOGASTRODUODENOSCOPY     ESOPHAGOGASTRODUODENOSCOPY  06/14/2011   Procedure: ESOPHAGOGASTRODUODENOSCOPY (EGD);  Surgeon: Winfield Cunas., MD;  Location: [facility];  Service: Endoscopy;  Laterality: N/A;   ESOPHAGOGASTRODUODENOSCOPY N/A 07/28/2017   Procedure: ESOPHAGOGASTRODUODENOSCOPY (EGD);  Surgeon: Laurence Spates, MD;  Location: [facility];  Service: Endoscopy;  Laterality: N/A;   ESOPHAGOGASTRODUODENOSCOPY (EGD) WITH PROPOFOL N/A 08/12/2014   Procedure: ESOPHAGOGASTRODUODENOSCOPY (EGD) WITH PROPOFOL;  Surgeon: Laurence Spates, MD;  [facility];  Service: Endoscopy;  Laterality: N/A;   ESOPHAGOGASTRODUODENOSCOPY (EGD) WITH  PROPOFOL N/A 10/29/2016   Procedure: ESOPHAGOGASTRODUODENOSCOPY (EGD) WITH PROPOFOL;  Surgeon: Laurence Spates, MD;  [facility];  Service: Endoscopy;  Laterality: N/A;   FOREIGN BODY REMOVAL  07/28/2017   Procedure: FOREIGN BODY REMOVAL;  Surgeon: Laurence Spates, MD;  [facility];  Service: Endoscopy;;   gamma knife     3 yrs ago [facility]"brain area around trigeminal nerve area"   PROSTATE BIOPSY     x 3   SAVORY DILATION  06/14/2011   Procedure: SAVORY DILATION;  Surgeon: Winfield Cunas., MD;  [facility]: [facility];  Service: Endoscopy;  Laterality: N/A;  need xray   SAVORY DILATION N/A 08/12/2014   Procedure: SAVORY DILATION;  Surgeon: Laurence Spates, MD;  [facility];  Service: Endoscopy;  Laterality: N/A;   SAVORY DILATION N/A 10/29/2016   Procedure: SAVORY DILATION;  Surgeon: Laurence Spates, MD;  [facility];  Service: Endoscopy;  Laterality: N/A;   TONSILLECTOMY      child   TRIGEMINAL NERVE BALLOON DECOMPRESSION Right    right ear deafness x 4 total    Prior to Admission medications   Medication Sig Start Date End Date Taking? Authorizing Provider  acetaminophen (TYLENOL) 500 MG tablet Take 1,000 mg by mouth every 6 (six) hours as needed for mild pain.   Yes [provider]  aspirin 81 MG tablet Take 81 mg by mouth daily.   Yes [provider]  atorvastatin (LIPITOR) 80 MG tablet Take 1 tablet (80 mg total) by mouth daily.  05/07/21  Yes Dunn, Dayna N, PA-C  finasteride (PROSCAR) 5 MG tablet Take 5 mg by mouth daily.   Yes [provider]  gabapentin (NEURONTIN) 400 MG capsule TAKE 1 CAPSULE BY MOUTH 4 TIMES DAILY (EVERY 5 HOURS) 08/14/20  Yes Lomax, Amy, NP  Glucosamine HCl (GLUCOSAMINE PO) Take 1,000 mg by mouth daily.   Yes [provider]  LINZESS 145 MCG CAPS capsule Take 145 mcg by mouth every morning. 03/02/20  Yes [provider]  lisinopril (ZESTRIL) 10 MG tablet Take 1 tablet (10 mg  total) by mouth daily. 05/22/21  Yes Dunn, Dayna N, PA-C  metoprolol succinate (TOPROL-XL) 50 MG 24 hr tablet Take 1 tablet (50 mg total) by mouth daily. 05/07/21  Yes Dunn, Dayna N, PA-C  Multiple Vitamin (MULTIVITAMIN WITH MINERALS) TABS tablet Take 1 tablet by mouth daily.   Yes [provider]  neomycin-polymyxin b-dexamethasone (MAXITROL) 3.5-10000-0.1 OINT Place 1 application into the right eye 3 (three) times a week. 03/28/20  Yes [provider]  Omega-3 Fatty Acids (FISH OIL) 1200 MG CAPS Take 4,800 mg by mouth 2 (two) times daily.    Yes [provider]  Omeprazole (PRILOSEC PO) Take 40 mg by mouth every evening.   Yes [provider]  tamsulosin (FLOMAX) 0.4 MG CAPS capsule Take 1 capsule by mouth daily. 11/14/17  Yes [provider]  nitroGLYCERIN (NITROSTAT) 0.4 MG SL tablet Place 1 tablet (0.4 mg total) under the tongue every 5 (five) minutes as needed for chest pain. 03/17/20   Jettie Booze, MD    Scheduled Meds: Continuous Infusions: PRN Meds:.  Allergies as of 05/31/2021   (No Known Allergies)    Family History  Problem Relation Age of Onset   Colon cancer Mother    Heart disease Mother    Liver cancer Sister    Alcohol abuse Father     Social History   Socioeconomic History   Marital status: Married    Spouse name: Not on file   Number of children: 1   Years of education: 12   Highest education level: Not on file  Occupational History   Occupation: Retired  Tobacco Use   Smoking status: Former    Types: Cigarettes    Quit date: 06/14/1967    Years since quitting: 54.0   Smokeless tobacco: Never  Vaping Use   Vaping Use: Never used  Substance and Sexual Activity   Alcohol use: No    Alcohol/week: 1.0 standard drink    Types: 1 Standard drinks or equivalent per week   Drug use: No   Sexual activity: Not on file  Other Topics Concern   Not on file  Social History Narrative   Lives at home w/ his wife    Right-handed   Caffeine: 2 cups per day   Social Determinants of Health   Financial Resource Strain: Not on file  Food Insecurity: Not on file  Transportation Needs: Not on file  Physical Activity: Not on file  Stress: Not on file  Social Connections: Not on file  Intimate Partner Violence: Not on file    Review of Systems: All negative except as stated above in HPI.  Physical Exam: Vital signs: Vitals:   05/31/21 1954 05/31/21 2020  BP:  (!) 179/80  Pulse:  70  Resp: 16 17  Temp:  98.8 F (37.1 C)  SpO2:  99%     General:   Elderly, Alert,  Well-developed, well-nourished, pleasant and cooperative in  NAD Head: normocephalic, atraumatic Eyes: anicteric sclera ENT: oropharynx clear Neck: supple, nontender Lungs:  Clear throughout to auscultation.   No wheezes, crackles, or rhonchi. No acute distress. Heart:  Regular rate and rhythm; no murmurs, clicks, rubs,  or gallops. Abdomen: soft, nontender, nondistended, +BS  Rectal:  Deferred Ext: no edema  GI:  Lab Results: No results for input(s): WBC, HGB, HCT, PLT in the last 72 hours. BMET No results for input(s): NA, K, CL, CO2, GLUCOSE, BUN, CREATININE, CALCIUM in the last 72 hours. LFT No results for input(s): PROT, ALBUMIN, AST, ALT, ALKPHOS, BILITOT, BILIDIR, IBILI in the last 72 hours. PT/INR No results for input(s): LABPROT, INR in the last 72 hours.   Studies/Results: No results found.  Impression/Plan: Food impaction with history of esophageal stricture in need of an EGD and possible dilation unless esophageal mucosal trauma from the food and then will need a repeat EGD in the near future for dilation.    LOS: 0 days   Lear Ng  05/31/2021, 8:40 PM  Questions please call [phone]

## 2021-05-31 NOTE — ED Triage Notes (Signed)
Pt has a piece of chicken stuck from dinner around 5 pm. Pt reports having a hx of issues with swallowing and needing his esophagus stretched. Pt is concerned about his night time meds being delayed.

## 2021-05-31 NOTE — Interval H&P Note (Signed)
History and Physical Interval Note:  05/31/2021 8:44 PM  Billy Knox  has presented today for surgery, with the diagnosis of food impaction.  The various methods of treatment have been discussed with the patient and family. After consideration of risks, benefits and other options for treatment, the patient has consented to  Procedure(s): ESOPHAGOGASTRODUODENOSCOPY (EGD) WITH PROPOFOL (N/A) as a surgical intervention.  The patient's history has been reviewed, patient examined, no change in status, stable for surgery.  I have reviewed the patient's chart and labs.  Questions were answered to the patient's satisfaction.     Lear Ng

## 2021-05-31 NOTE — Anesthesia Preprocedure Evaluation (Addendum)
Anesthesia Evaluation  Patient identified by MRN, date of birth, ID band Patient awake    Reviewed: Allergy & Precautions, NPO status , Patient's Chart, lab work & pertinent test results, reviewed documented beta blocker date and time   Airway Mallampati: I  TM Distance: >3 FB Neck ROM: Full    Dental  (+) Dental Advisory Given, Caps, Missing   Pulmonary former smoker,    Pulmonary exam normal breath sounds clear to auscultation       Cardiovascular hypertension, Pt. on medications and Pt. on home beta blockers + CAD and + Past MI  Normal cardiovascular exam Rhythm:Regular Rate:Normal  Silent MI 2007  Echo 05/18/21 1. Left ventricular ejection fraction, by estimation, is 55 to 60%. The left ventricle has normal function. The left ventricle has no regional wall motion abnormalities. Left ventricular diastolic parameters are consistent with Grade I diastolic dysfunction (impaired relaxation).  2. Right ventricular systolic function is normal. The right ventricular size is normal. Tricuspid regurgitation signal is inadequate for assessing PA pressure.  3. The mitral valve is degenerative. Trivial mitral valve regurgitation. The mean mitral valve gradient is 2.6 mmHg with average heart rate of 64 bpm.  4. The aortic valve is tricuspid. Aortic valve regurgitation is mild.    Neuro/Psych  Headaches, Hx/o trigeminal neuralgia  Neuromuscular disease negative psych ROS   GI/Hepatic Neg liver ROS, GERD  Medicated,Hx/o esophageal stricture S/P dilation Food impaction   Endo/Other  Hyperlipidemia Obesity  Renal/GU negative Renal ROS  negative genitourinary   Musculoskeletal  (+) Arthritis , Osteoarthritis,    Abdominal (+) + obese,   Peds  Hematology negative hematology ROS (+)   Anesthesia Other Findings   Reproductive/Obstetrics                            Anesthesia Physical Anesthesia Plan  ASA:  3 and emergent  Anesthesia Plan: General   Post-op Pain Management: Minimal or no pain anticipated   Induction: Intravenous, Rapid sequence and Cricoid pressure planned  PONV Risk Score and Plan: 2 and Treatment may vary due to age or medical condition and Propofol infusion  Airway Management Planned: Oral ETT  Additional Equipment: None  Intra-op Plan:   Post-operative Plan: Extubation in OR  Informed Consent: I have reviewed the patients History and Physical, chart, labs and discussed the procedure including the risks, benefits and alternatives for the proposed anesthesia with the patient or authorized representative who has indicated his/her understanding and acceptance.     Dental advisory given  Plan Discussed with: CRNA and Anesthesiologist  Anesthesia Plan Comments:        Anesthesia Quick Evaluation

## 2021-05-31 NOTE — H&P (View-Only) (Signed)
Referring Provider: ER Primary Care Physician:  Cari Caraway, MD Primary Gastroenterologist:  Dr. Michail Sermon  Reason for Consultation:  Food Impaction  HPI: Billy Knox is a 81 y.o. male who felt food hang up this evening while eating chicken pot pie and has been unable to swallow saliva or liquids since then. He has had some vomiting since the food hung up. He is unsure how much food he swallowed. Food impaction in 2019 and reports intermittent mild dysphagia since then. History of esophageal stricture with dilations in the past. History of GERD.  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   Sudden blockage of esophagus 07/2017   Trigeminal neuralgia     Past Surgical History:  Procedure Laterality Date   CYSTOSCOPY     ESOPHAGOGASTRODUODENOSCOPY     ESOPHAGOGASTRODUODENOSCOPY  06/14/2011   Procedure: ESOPHAGOGASTRODUODENOSCOPY (EGD);  Surgeon: Winfield Cunas., MD;  Location: [facility];  Service: Endoscopy;  Laterality: N/A;   ESOPHAGOGASTRODUODENOSCOPY N/A 07/28/2017   Procedure: ESOPHAGOGASTRODUODENOSCOPY (EGD);  Surgeon: Laurence Spates, MD;  Location: [facility];  Service: Endoscopy;  Laterality: N/A;   ESOPHAGOGASTRODUODENOSCOPY (EGD) WITH PROPOFOL N/A 08/12/2014   Procedure: ESOPHAGOGASTRODUODENOSCOPY (EGD) WITH PROPOFOL;  Surgeon: Laurence Spates, MD;  Location: [facility];  Service: Endoscopy;  Laterality: N/A;   ESOPHAGOGASTRODUODENOSCOPY (EGD) WITH  PROPOFOL N/A 10/29/2016   Procedure: ESOPHAGOGASTRODUODENOSCOPY (EGD) WITH PROPOFOL;  Surgeon: Laurence Spates, MD;  Location: [facility];  Service: Endoscopy;  Laterality: N/A;   FOREIGN BODY REMOVAL  07/28/2017   Procedure: FOREIGN BODY REMOVAL;  Surgeon: Laurence Spates, MD;  Location: [facility];  Service: Endoscopy;;   gamma knife     3 yrs ago [facility]"brain area around trigeminal nerve area"   PROSTATE BIOPSY     x 3   SAVORY DILATION  06/14/2011   Procedure: SAVORY DILATION;  Surgeon: Winfield Cunas., MD;  Location: [facility];  Service: Endoscopy;  Laterality: N/A;  need xray   SAVORY DILATION N/A 08/12/2014   Procedure: SAVORY DILATION;  Surgeon: Laurence Spates, MD;  Location: [facility];  Service: Endoscopy;  Laterality: N/A;   SAVORY DILATION N/A 10/29/2016   Procedure: SAVORY DILATION;  Surgeon: Laurence Spates, MD;  Location: [facility];  Service: Endoscopy;  Laterality: N/A;   TONSILLECTOMY      child   TRIGEMINAL NERVE BALLOON DECOMPRESSION Right    right ear deafness x 4 total    Prior to Admission medications   Medication Sig Start Date End Date Taking? Authorizing Provider  acetaminophen (TYLENOL) 500 MG tablet Take 1,000 mg by mouth every 6 (six) hours as needed for mild pain.   Yes [provider]  aspirin 81 MG tablet Take 81 mg by mouth daily.   Yes [provider]  atorvastatin (LIPITOR) 80 MG tablet Take 1 tablet (80 mg total) by mouth daily.  05/07/21  Yes Dunn, Dayna N, PA-C  finasteride (PROSCAR) 5 MG tablet Take 5 mg by mouth daily.   Yes [provider]  gabapentin (NEURONTIN) 400 MG capsule TAKE 1 CAPSULE BY MOUTH 4 TIMES DAILY (EVERY 5 HOURS) 08/14/20  Yes Lomax, Amy, NP  Glucosamine HCl (GLUCOSAMINE PO) Take 1,000 mg by mouth daily.   Yes [provider]  LINZESS 145 MCG CAPS capsule Take 145 mcg by mouth every morning. 03/02/20  Yes [provider]  lisinopril (ZESTRIL) 10 MG tablet Take 1 tablet (10 mg  total) by mouth daily. 05/22/21  Yes Dunn, Dayna N, PA-C  metoprolol succinate (TOPROL-XL) 50 MG 24 hr tablet Take 1 tablet (50 mg total) by mouth daily. 05/07/21  Yes Dunn, Dayna N, PA-C  Multiple Vitamin (MULTIVITAMIN WITH MINERALS) TABS tablet Take 1 tablet by mouth daily.   Yes [provider]  neomycin-polymyxin b-dexamethasone (MAXITROL) 3.5-10000-0.1 OINT Place 1 application into the right eye 3 (three) times a week. 03/28/20  Yes [provider]  Omega-3 Fatty Acids (FISH OIL) 1200 MG CAPS Take 4,800 mg by mouth 2 (two) times daily.    Yes [provider]  Omeprazole (PRILOSEC PO) Take 40 mg by mouth every evening.   Yes [provider]  tamsulosin (FLOMAX) 0.4 MG CAPS capsule Take 1 capsule by mouth daily. 11/14/17  Yes [provider]  nitroGLYCERIN (NITROSTAT) 0.4 MG SL tablet Place 1 tablet (0.4 mg total) under the tongue every 5 (five) minutes as needed for chest pain. 03/17/20   Jettie Booze, MD    Scheduled Meds: Continuous Infusions: PRN Meds:.  Allergies as of 05/31/2021   (No Known Allergies)    Family History  Problem Relation Age of Onset   Colon cancer Mother    Heart disease Mother    Liver cancer Sister    Alcohol abuse Father     Social History   Socioeconomic History   Marital status: Married    Spouse name: Not on file   Number of children: 1   Years of education: 12   Highest education level: Not on file  Occupational History   Occupation: Retired  Tobacco Use   Smoking status: Former    Types: Cigarettes    Quit date: 06/14/1967    Years since quitting: 54.0   Smokeless tobacco: Never  Vaping Use   Vaping Use: Never used  Substance and Sexual Activity   Alcohol use: No    Alcohol/week: 1.0 standard drink    Types: 1 Standard drinks or equivalent per week   Drug use: No   Sexual activity: Not on file  Other Topics Concern   Not on file  Social History Narrative   Lives at home w/ his wife    Right-handed   Caffeine: 2 cups per day   Social Determinants of Health   Financial Resource Strain: Not on file  Food Insecurity: Not on file  Transportation Needs: Not on file  Physical Activity: Not on file  Stress: Not on file  Social Connections: Not on file  Intimate Partner Violence: Not on file    Review of Systems: All negative except as stated above in HPI.  Physical Exam: Vital signs: Vitals:   05/31/21 1954 05/31/21 2020  BP:  (!) 179/80  Pulse:  70  Resp: 16 17  Temp:  98.8 F (37.1 C)  SpO2:  99%     General:   Elderly, Alert,  Well-developed, well-nourished, pleasant and cooperative in  NAD Head: normocephalic, atraumatic Eyes: anicteric sclera ENT: oropharynx clear Neck: supple, nontender Lungs:  Clear throughout to auscultation.   No wheezes, crackles, or rhonchi. No acute distress. Heart:  Regular rate and rhythm; no murmurs, clicks, rubs,  or gallops. Abdomen: soft, nontender, nondistended, +BS  Rectal:  Deferred Ext: no edema  GI:  Lab Results: No results for input(s): WBC, HGB, HCT, PLT in the last 72 hours. BMET No results for input(s): NA, K, CL, CO2, GLUCOSE, BUN, CREATININE, CALCIUM in the last 72 hours. LFT No results for input(s): PROT, ALBUMIN, AST, ALT, ALKPHOS, BILITOT, BILIDIR, IBILI in the last 72 hours. PT/INR No results for input(s): LABPROT, INR in the last 72 hours.   Studies/Results: No results found.  Impression/Plan: Food impaction with history of esophageal stricture in need of an EGD and possible dilation unless esophageal mucosal trauma from the food and then will need a repeat EGD in the near future for dilation.    LOS: 0 days   Lear Ng  05/31/2021, 8:40 PM  Questions please call [phone]

## 2021-05-31 NOTE — Anesthesia Postprocedure Evaluation (Signed)
Anesthesia Post Note  Patient: Billy Knox  Procedure(s) Performed: ESOPHAGOGASTRODUODENOSCOPY (EGD) WITH PROPOFOL FOREIGN BODY REMOVAL     Patient location during evaluation: PACU Anesthesia Type: General Level of consciousness: awake and alert and oriented Pain management: pain level controlled Vital Signs Assessment: post-procedure vital signs reviewed and stable Respiratory status: spontaneous breathing, nonlabored ventilation and respiratory function stable Cardiovascular status: blood pressure returned to baseline and stable Postop Assessment: no apparent nausea or vomiting Anesthetic complications: no   No notable events documented.  Last Vitals:  Vitals:   05/31/21 2020 05/31/21 2127  BP: (!) 179/80 (!) 181/95  Pulse: 70 81  Resp: 17 20  Temp: 37.1 C (!) 36.4 C  SpO2: 99% 100%    Last Pain:  Vitals:   05/31/21 2127  TempSrc:   PainSc: 0-No pain                 Makalynn Berwanger A.

## 2021-05-31 NOTE — Op Note (Signed)
[facility] Patient Name: Billy Knox Procedure Date: 05/31/2021 MRN: [id number] Attending MD: Lear Ng , MD Date of Birth: March 07, 1940 CSN: [id number] Age: 81 Admit Type: Outpatient Procedure:                Upper GI endoscopy Indications:              Dysphagia, Foreign body in the esophagus Providers:                Lear Ng, MD, Jeanella Cara, RN,                            Luan Moore, Technician, Margurite Auerbach Referring MD:             ER Medicines:                Propofol per Anesthesia, Monitored Anesthesia Care Complications:            No immediate complications. Estimated Blood Loss:     Estimated blood loss: none. Procedure:                Pre-Anesthesia Assessment:                           - Prior to the procedure, a History and Physical                            was performed, and patient medications and                            allergies were reviewed. The patient's tolerance of                            previous anesthesia was also reviewed. The risks                            and benefits of the procedure and the sedation                            options and risks were discussed with the patient.                            All questions were answered, and informed consent                            was obtained. Prior Anticoagulants: The patient has                            taken no previous anticoagulant or antiplatelet                            agents. ASA Grade Assessment: III - A patient with                            severe systemic disease. After reviewing the risks  and benefits, the patient was deemed in                            satisfactory condition to undergo the procedure.                           After obtaining informed consent, the endoscope was                            passed under direct vision. Throughout the                            procedure, the patient's  blood pressure, pulse, and                            oxygen saturations were monitored continuously. The                            [id number]-H190 ([id number]) Olympus endoscope was introduced                            through the mouth, and advanced to the second part                            of duodenum. The upper GI endoscopy was somewhat                            difficult due to presence of food. Successful                            completion of the procedure was aided by performing                            the maneuvers documented (below) in this report.                            The patient tolerated the procedure well. Scope In: Scope Out: Findings:      Food was found in the mid esophagus and in the distal esophagus. Removal       of food was accomplished. Multiple passes of a Jabier Mutton net to remove the       food bolus and also advanced portions of the food into the stomach after       clearing the main solid part of the bolus. Large amount of soft portions       of food and liquid in the mid-esophagus suggesting esophageal       dysmotility. Mucosal edema and erythema noted in the distal esophagus       after clearing the food bolus consistent with trauma from the food       impaction (dilation not attempted). Distal esophageal stricture seen.      One benign-appearing, intrinsic moderate (circumferential scarring or       stenosis; an endoscope may pass) stenosis was found in the distal       esophagus. The stenosis was traversed.  The Z-line was found 42 cm from the incisors.      A large amount of food (residue) was found in the gastric fundus and in       the gastric body.      The examined duodenum was normal. Impression:               - Food in the mid esophagus and in the distal                            esophagus. Removal was successful.                           - Benign-appearing esophageal stenosis.                           - Z-line, 42 cm from the incisors.                            - A large amount of food (residue) in the stomach.                           - Normal examined duodenum. Moderate Sedation:      N/A - MAC procedure Recommendation:           - Patient has a contact number available for                            emergencies. The signs and symptoms of potential                            delayed complications were discussed with the                            patient. Return to normal activities tomorrow.                            Written discharge instructions were provided to the                            patient.                           - Clear liquid diet.                           - Advance as tolerated tomorrow but avoid meats for                            the next 3 days and then eat only in small pieces,                            chew well, and eat slow. Procedure Code(s):        --- Professional ---                           [phone], Esophagogastroduodenoscopy,  flexible,                            transoral; with removal of foreign body(s) Diagnosis Code(s):        --- Professional ---                           F79.024O, Food in esophagus causing other injury,                            initial encounter                           K22.2, Esophageal obstruction                           T18.2XXA, Foreign body in stomach, initial encounter                           R13.10, Dysphagia, unspecified                           T18.108A, Unspecified foreign body in esophagus                            causing other injury, initial encounter CPT copyright 2019 [employer]. All rights reserved. The codes documented in this report are preliminary and upon coder review may  be revised to meet current compliance requirements. Lear Ng, MD 05/31/2021 9:32:51 PM This report has been signed electronically. Number of Addenda: 0

## 2021-05-31 NOTE — Discharge Instructions (Addendum)
YOU HAD AN ENDOSCOPIC PROCEDURE TODAY: Refer to the procedure report and other information in the discharge instructions given to you for any specific questions about what was found during the examination. If this information does not answer your questions, please call [facility] office at [phone] to clarify.   YOU SHOULD EXPECT: Some feelings of bloating in the abdomen. Passage of more gas than usual. Walking can help get rid of the air that was put into your GI tract during the procedure and reduce the bloating. If you had a lower endoscopy (such as a colonoscopy or flexible sigmoidoscopy) you may notice spotting of blood in your stool or on the toilet paper. Some abdominal soreness may be present for a day or two, also.  DIET: Your first meal following the procedure should be a light meal and then it is ok to progress to your normal diet. A half-sandwich or bowl of soup is an example of a good first meal. Heavy or fried foods are harder to digest and may make you feel nauseous or bloated. Drink plenty of fluids but you should avoid alcoholic beverages for 24 hours. If you had a esophageal dilation, please see attached instructions for diet.    ACTIVITY: Your care partner should take you home directly after the procedure. You should plan to take it easy, moving slowly for the rest of the day. You can resume normal activity the day after the procedure however YOU SHOULD NOT DRIVE, use power tools, machinery or perform tasks that involve climbing or major physical exertion for 24 hours (because of the sedation medicines used during the test).   SYMPTOMS TO REPORT IMMEDIATELY: A gastroenterologist can be reached at any hour. Please call [phone]  for any of the following symptoms:  Following lower endoscopy (colonoscopy, flexible sigmoidoscopy) Excessive amounts of blood in the stool  Significant tenderness, worsening of abdominal pains  Swelling of the abdomen that is new, acute  Fever of 100  or higher  Following upper endoscopy (EGD, EUS, ERCP, esophageal dilation) Vomiting of blood or coffee ground material  New, significant abdominal pain  New, significant chest pain or pain under the shoulder blades  Painful or persistently difficult swallowing  New shortness of breath  Black, tarry-looking or red, bloody stools  FOLLOW UP:  If any biopsies were taken you will be contacted by phone or by letter within the next 1-3 weeks. Call [phone]  if you have not heard about the biopsies in 3 weeks.  Please also call with any specific questions about appointments or follow up tests. YOU HAD AN ENDOSCOPIC PROCEDURE TODAY: Refer to the procedure report and other information in the discharge instructions given to you for any specific questions about what was found during the examination. If this information does not answer your questions, please call [facility] office at [phone] to clarify.   YOU SHOULD EXPECT: Some feelings of bloating in the abdomen. Passage of more gas than usual. Walking can help get rid of the air that was put into your GI tract during the procedure and reduce the bloating. If you had a lower endoscopy (such as a colonoscopy or flexible sigmoidoscopy) you may notice spotting of blood in your stool or on the toilet paper. Some abdominal soreness may be present for a day or two, also.  DIET: Your first meal following the procedure should be a light meal and then it is ok to progress to your normal diet. A half-sandwich or bowl of soup is an   example of a good first meal. Heavy or fried foods are harder to digest and may make you feel nauseous or bloated. Drink plenty of fluids but you should avoid alcoholic beverages for 24 hours. If you had a esophageal dilation, please see attached instructions for diet.    ACTIVITY: Your care partner should take you home directly after the procedure. You should plan to take it easy, moving slowly for the rest of the day. You can resume  normal activity the day after the procedure however YOU SHOULD NOT DRIVE, use power tools, machinery or perform tasks that involve climbing or major physical exertion for 24 hours (because of the sedation medicines used during the test).   SYMPTOMS TO REPORT IMMEDIATELY: A gastroenterologist can be reached at any hour. Please call [phone]  for any of the following symptoms:  Following lower endoscopy (colonoscopy, flexible sigmoidoscopy) Excessive amounts of blood in the stool  Significant tenderness, worsening of abdominal pains  Swelling of the abdomen that is new, acute  Fever of 100 or higher  Following upper endoscopy (EGD, EUS, ERCP, esophageal dilation) Vomiting of blood or coffee ground material  New, significant abdominal pain  New, significant chest pain or pain under the shoulder blades  Painful or persistently difficult swallowing  New shortness of breath  Black, tarry-looking or red, bloody stools  FOLLOW UP:  If any biopsies were taken you will be contacted by phone or by letter within the next 1-3 weeks. Call [phone]  if you have not heard about the biopsies in 3 weeks.  Please also call with any specific questions about appointments or follow up tests.   CLEAR LIQUIDS ONLY tonight. Starting tomorrow slowly advance diet as tolerated but AVOID ALL MEATS for the next 3 days. Eat slow, chew well, and take small bites.

## 2021-06-04 ENCOUNTER — Encounter (HOSPITAL_COMMUNITY): Payer: Self-pay | Admitting: Gastroenterology

## 2021-06-05 ENCOUNTER — Ambulatory Visit: Payer: Medicare Other | Admitting: Physician Assistant

## 2021-06-15 DIAGNOSIS — H353222 Exudative age-related macular degeneration, left eye, with inactive choroidal neovascularization: Secondary | ICD-10-CM | POA: Diagnosis not present

## 2021-06-15 DIAGNOSIS — H0589 Other disorders of orbit: Secondary | ICD-10-CM | POA: Diagnosis not present

## 2021-06-15 DIAGNOSIS — H353124 Nonexudative age-related macular degeneration, left eye, advanced atrophic with subfoveal involvement: Secondary | ICD-10-CM | POA: Diagnosis not present

## 2021-06-15 DIAGNOSIS — H40013 Open angle with borderline findings, low risk, bilateral: Secondary | ICD-10-CM | POA: Diagnosis not present

## 2021-06-20 DIAGNOSIS — Z1389 Encounter for screening for other disorder: Secondary | ICD-10-CM | POA: Diagnosis not present

## 2021-06-20 DIAGNOSIS — I25119 Atherosclerotic heart disease of native coronary artery with unspecified angina pectoris: Secondary | ICD-10-CM | POA: Diagnosis not present

## 2021-06-20 DIAGNOSIS — I10 Essential (primary) hypertension: Secondary | ICD-10-CM | POA: Diagnosis not present

## 2021-06-20 DIAGNOSIS — E782 Mixed hyperlipidemia: Secondary | ICD-10-CM | POA: Diagnosis not present

## 2021-06-20 DIAGNOSIS — Z0000 Encounter for general adult medical examination without abnormal findings: Secondary | ICD-10-CM | POA: Diagnosis not present

## 2021-06-26 DIAGNOSIS — K2970 Gastritis, unspecified, without bleeding: Secondary | ICD-10-CM | POA: Diagnosis not present

## 2021-06-26 DIAGNOSIS — K2289 Other specified disease of esophagus: Secondary | ICD-10-CM | POA: Diagnosis not present

## 2021-06-26 DIAGNOSIS — R1310 Dysphagia, unspecified: Secondary | ICD-10-CM | POA: Diagnosis not present

## 2021-06-26 DIAGNOSIS — K2270 Barrett's esophagus without dysplasia: Secondary | ICD-10-CM | POA: Diagnosis not present

## 2021-06-26 DIAGNOSIS — K319 Disease of stomach and duodenum, unspecified: Secondary | ICD-10-CM | POA: Diagnosis not present

## 2021-06-28 DIAGNOSIS — K319 Disease of stomach and duodenum, unspecified: Secondary | ICD-10-CM | POA: Diagnosis not present

## 2021-06-28 DIAGNOSIS — K2270 Barrett's esophagus without dysplasia: Secondary | ICD-10-CM | POA: Diagnosis not present

## 2021-07-23 ENCOUNTER — Encounter (HOSPITAL_COMMUNITY)
Admission: EM | Disposition: A | Discharge status: Home / Self Care | Payer: Self-pay | Source: Home / Self Care | Attending: Emergency Medicine

## 2021-07-23 ENCOUNTER — Other Ambulatory Visit: Payer: Self-pay

## 2021-07-23 ENCOUNTER — Emergency Department (HOSPITAL_COMMUNITY): Payer: Medicare Other | Admitting: Anesthesiology

## 2021-07-23 ENCOUNTER — Encounter (HOSPITAL_COMMUNITY): Payer: Self-pay | Admitting: Emergency Medicine

## 2021-07-23 ENCOUNTER — Emergency Department (EMERGENCY_DEPARTMENT_HOSPITAL): Payer: Medicare Other | Admitting: Anesthesiology

## 2021-07-23 ENCOUNTER — Ambulatory Visit (HOSPITAL_COMMUNITY)
Admission: EM | Admit: 2021-07-23 | Discharge: 2021-07-23 | Disposition: A | Discharge status: Home / Self Care | Payer: Medicare Other | Attending: Emergency Medicine | Admitting: Emergency Medicine

## 2021-07-23 DIAGNOSIS — Z7982 Long term (current) use of aspirin: Secondary | ICD-10-CM | POA: Diagnosis not present

## 2021-07-23 DIAGNOSIS — Z87891 Personal history of nicotine dependence: Secondary | ICD-10-CM | POA: Insufficient documentation

## 2021-07-23 DIAGNOSIS — K222 Esophageal obstruction: Secondary | ICD-10-CM | POA: Diagnosis not present

## 2021-07-23 DIAGNOSIS — T182XXA Foreign body in stomach, initial encounter: Secondary | ICD-10-CM | POA: Diagnosis not present

## 2021-07-23 DIAGNOSIS — T18108A Unspecified foreign body in esophagus causing other injury, initial encounter: Secondary | ICD-10-CM | POA: Diagnosis not present

## 2021-07-23 DIAGNOSIS — Z8249 Family history of ischemic heart disease and other diseases of the circulatory system: Secondary | ICD-10-CM | POA: Diagnosis not present

## 2021-07-23 DIAGNOSIS — X58XXXA Exposure to other specified factors, initial encounter: Secondary | ICD-10-CM | POA: Insufficient documentation

## 2021-07-23 DIAGNOSIS — E785 Hyperlipidemia, unspecified: Secondary | ICD-10-CM | POA: Diagnosis not present

## 2021-07-23 DIAGNOSIS — K2210 Ulcer of esophagus without bleeding: Secondary | ICD-10-CM | POA: Diagnosis not present

## 2021-07-23 DIAGNOSIS — K2100 Gastro-esophageal reflux disease with esophagitis, without bleeding: Secondary | ICD-10-CM

## 2021-07-23 DIAGNOSIS — G500 Trigeminal neuralgia: Secondary | ICD-10-CM | POA: Insufficient documentation

## 2021-07-23 DIAGNOSIS — T18128A Food in esophagus causing other injury, initial encounter: Secondary | ICD-10-CM | POA: Diagnosis not present

## 2021-07-23 DIAGNOSIS — I2510 Atherosclerotic heart disease of native coronary artery without angina pectoris: Secondary | ICD-10-CM | POA: Insufficient documentation

## 2021-07-23 DIAGNOSIS — E669 Obesity, unspecified: Secondary | ICD-10-CM | POA: Diagnosis not present

## 2021-07-23 DIAGNOSIS — Z6828 Body mass index (BMI) 28.0-28.9, adult: Secondary | ICD-10-CM | POA: Insufficient documentation

## 2021-07-23 DIAGNOSIS — I10 Essential (primary) hypertension: Secondary | ICD-10-CM | POA: Diagnosis not present

## 2021-07-23 DIAGNOSIS — R1310 Dysphagia, unspecified: Secondary | ICD-10-CM | POA: Diagnosis not present

## 2021-07-23 DIAGNOSIS — I252 Old myocardial infarction: Secondary | ICD-10-CM | POA: Diagnosis not present

## 2021-07-23 DIAGNOSIS — M1990 Unspecified osteoarthritis, unspecified site: Secondary | ICD-10-CM | POA: Insufficient documentation

## 2021-07-23 DIAGNOSIS — K208 Other esophagitis without bleeding: Secondary | ICD-10-CM | POA: Diagnosis not present

## 2021-07-23 HISTORY — PX: ESOPHAGOSCOPY: SHX5534

## 2021-07-23 SURGERY — ESOPHAGOSCOPY
Anesthesia: General

## 2021-07-23 MED ORDER — LACTATED RINGERS IV SOLN: INTRAVENOUS | Status: DC

## 2021-07-23 MED ORDER — EPHEDRINE SULFATE-NACL 50-0.9 MG/10ML-% IV SOSY
PREFILLED_SYRINGE | INTRAVENOUS | Status: DC | PRN
  Administered 2021-07-23: 10 mg via INTRAVENOUS

## 2021-07-23 MED ORDER — LIDOCAINE 2% (20 MG/ML) 5 ML SYRINGE
INTRAMUSCULAR | Status: DC | PRN
  Administered 2021-07-23: 80 mg via INTRAVENOUS

## 2021-07-23 MED ORDER — PROPOFOL 10 MG/ML IV BOLUS
INTRAVENOUS | Status: AC
  Filled 2021-07-23: qty 20

## 2021-07-23 MED ORDER — PROPOFOL 10 MG/ML IV BOLUS
INTRAVENOUS | Status: DC | PRN
  Administered 2021-07-23: 160 mg via INTRAVENOUS

## 2021-07-23 MED ORDER — SUCCINYLCHOLINE CHLORIDE 200 MG/10ML IV SOSY
PREFILLED_SYRINGE | INTRAVENOUS | Status: DC | PRN
  Administered 2021-07-23: 140 mg via INTRAVENOUS

## 2021-07-23 MED ORDER — ONDANSETRON HCL 4 MG/2ML IJ SOLN
INTRAMUSCULAR | Status: DC | PRN
  Administered 2021-07-23: 4 mg via INTRAVENOUS

## 2021-07-23 MED ORDER — FENTANYL CITRATE (PF) 100 MCG/2ML IJ SOLN
INTRAMUSCULAR | Status: DC | PRN
Start: 2021-07-23 — End: 2021-07-23
  Administered 2021-07-23: 50 ug via INTRAVENOUS

## 2021-07-23 MED ORDER — FENTANYL CITRATE (PF) 100 MCG/2ML IJ SOLN
INTRAMUSCULAR | Status: AC
  Filled 2021-07-23: qty 2

## 2021-07-23 SURGICAL SUPPLY — 15 items

## 2021-07-23 NOTE — Discharge Instructions (Addendum)
YOU HAD AN ENDOSCOPIC PROCEDURE TODAY: Refer to the procedure report and other information in the discharge instructions given to you for any specific questions about what was found during the examination. If this information does not answer your questions, please call the [facility] office at [phone] to clarify.   YOU SHOULD EXPECT: Some feelings of bloating in the abdomen. Passage of more gas than usual. Walking can help get rid of the air that was put into your GI tract during the procedure and reduce the bloating.  DIET: Your first meal following the procedure should be a light meal and then it is ok to progress to your normal diet. A half-sandwich or bowl of soup is an example of a good first meal. Heavy or fried foods are harder to digest and may make you feel nauseous or bloated. Drink plenty of fluids but you should avoid alcoholic beverages for 24 hours.   ACTIVITY: Your care partner should take you home directly after the procedure. You should plan to take it easy, moving slowly for the rest of the day. You can resume normal activity the day after the procedure however YOU SHOULD NOT DRIVE, use power tools, machinery or perform tasks that involve climbing or major physical exertion for 24 hours (because of the sedation medicines used during the test).   SYMPTOMS TO REPORT IMMEDIATELY: A gastroenterologist can be reached at any hour. Please call [phone]  for any of the following symptoms:   Following upper endoscopy (EGD, EUS, ERCP, esophageal dilation) Vomiting of blood or coffee ground material  New, significant abdominal pain  New, significant chest pain or pain under the shoulder blades  Painful or persistently difficult swallowing  New shortness of breath  Black, tarry-looking or red, bloody stools  FOLLOW UP:  If any biopsies were taken you will be contacted by phone or by letter within the next 1-3 weeks. Call [phone]  if you have not heard about the biopsies in 3  weeks.  Please also call with any specific questions about appointments or follow up tests.   Liquids only tonight slowly advance diet tomorrow take small bites eat slowly chew your food well cut into small pieces take your time plenty of liquids while eating and follow-up with [place] as needed and consider trying another dilation

## 2021-07-23 NOTE — Anesthesia Postprocedure Evaluation (Signed)
Anesthesia Post Note  Patient: Billy Knox  Procedure(s) Performed: ESOPHAGOSCOPY     Patient location during evaluation: PACU Anesthesia Type: General Level of consciousness: awake Pain management: pain level controlled Vital Signs Assessment: post-procedure vital signs reviewed and stable Respiratory status: spontaneous breathing Cardiovascular status: stable Postop Assessment: no apparent nausea or vomiting Anesthetic complications: no   No notable events documented.  Last Vitals:  Vitals:   07/23/21 1808 07/23/21 1818  BP: (!) 153/81 (!) 155/73  Pulse: 86 79  Resp: 20 17  Temp:    SpO2: 96% 96%    Last Pain:  Vitals:   07/23/21 1808  TempSrc:   PainSc: 0-No pain                 Cyprian Gongaware

## 2021-07-23 NOTE — Anesthesia Procedure Notes (Signed)
Procedure Name: Intubation Date/Time: 07/23/2021 5:48 PM  Performed by: Gean Maidens, CRNAPre-anesthesia Checklist: Patient identified, Emergency Drugs available, Suction available, Patient being monitored and Timeout performed Patient Re-evaluated:Patient Re-evaluated prior to induction Oxygen Delivery Method: Circle system utilized Preoxygenation: Pre-oxygenation with 100% oxygen Induction Type: IV induction and Rapid sequence Laryngoscope Size: Mac and 4 Grade View: Grade I Tube type: Oral Tube size: 7.5 mm Number of attempts: 1 Airway Equipment and Method: Stylet Placement Confirmation: ETT inserted through vocal cords under direct vision, positive ETCO2 and breath sounds checked- equal and bilateral Secured at: 23 cm Tube secured with: Tape Dental Injury: Teeth and Oropharynx as per pre-operative assessment

## 2021-07-23 NOTE — Op Note (Signed)
[facility] Patient Name: Billy Knox Procedure Date: 07/23/2021 MRN: [id number] Attending MD: Clarene Essex , MD Date of Birth: 27-May-1940 CSN: [id number] Age: 81 Admit Type: Outpatient Procedure:                Upper GI endoscopy Indications:              Foreign body in the esophagus Providers:                Clarene Essex, MD, Dulcy Fanny, Gloris Ham, Technician Referring MD:              Medicines:                General Anesthesia Complications:            No immediate complications. Estimated Blood Loss:     Estimated blood loss: none. Procedure:                Pre-Anesthesia Assessment:                           - Prior to the procedure, a History and Physical                            was performed, and patient medications and                            allergies were reviewed. The patient's tolerance of                            previous anesthesia was also reviewed. The risks                            and benefits of the procedure and the sedation                            options and risks were discussed with the patient.                            All questions were answered, and informed consent                            was obtained. Prior Anticoagulants: The patient has                            taken no previous anticoagulant or antiplatelet                            agents except for aspirin. ASA Grade Assessment:                            III - A patient with severe systemic disease. After  reviewing the risks and benefits, the patient was                            deemed in satisfactory condition to undergo the                            procedure.                           After obtaining informed consent, the endoscope was                            passed under direct vision. Throughout the                            procedure, the patient's blood pressure, pulse, and                             oxygen saturations were monitored continuously. The                            GIF-H190 ([id number]) Olympus endoscope was introduced                            through the mouth, and advanced to the antrum of                            the stomach. The upper GI endoscopy was                            accomplished without difficulty. The patient                            tolerated the procedure well. Scope In: Scope Out: Findings:      LA Grade A (one or more mucosal breaks less than 5 mm, not extending       between tops of 2 mucosal folds) esophagitis with no bleeding was found.       He probably did have an impaction based on the inflammation which passed       on its own with the sedation      A medium amount of food (residue) was found in the gastric fundus and in       the gastric antrum. There was so much in the antrum that we did not       attempt to advance the scope past it      One benign-appearing, intrinsic mild stenosis was found. The stenosis       was traversed.      The exam was otherwise without abnormality. Impression:               - LA Grade A reflux and erosive esophagitis with no                            bleeding.                           -  A medium amount of food (residue) in the stomach.                           - Benign-appearing esophageal stenosis.                           - The examination was otherwise normal.                           - No specimens collected. Moderate Sedation:      Not Applicable - Patient had care per Anesthesia. Recommendation:           - Patient has a contact number available for                            emergencies. The signs and symptoms of potential                            delayed complications were discussed with the                            patient. Return to normal activities tomorrow.                            Written discharge instructions were provided to the                             patient.                           - Full liquid diet today. Take small bites cut                            meats chicken etc. into very small pieces and chew                            well and take your time and drink plenty of liquids                            while eating                           - Continue present medications.                           - Return to GI clinic PRN.                           - Telephone GI clinic if symptomatic PRN. Procedure Code(s):        --- Professional ---                           [phone], 52, Esophagogastroduodenoscopy, flexible,                            transoral;  diagnostic, including collection of                            specimen(s) by brushing or washing, when performed                            (separate procedure) Diagnosis Code(s):        --- Professional ---                           K21.00, Gastro-esophageal reflux disease with                            esophagitis, without bleeding                           K20.80, Other esophagitis without bleeding                           K22.2, Esophageal obstruction                           T18.108A, Unspecified foreign body in esophagus                            causing other injury, initial encounter CPT copyright 2019 [employer]. All rights reserved. The codes documented in this report are preliminary and upon coder review may  be revised to meet current compliance requirements. Clarene Essex, MD 07/23/2021 5:57:06 PM This report has been signed electronically. Number of Addenda: 0

## 2021-07-23 NOTE — ED Provider Notes (Signed)
[place] DEPT Provider Note   CSN: [id number] Arrival date & time: 07/23/21  1446     History  Chief Complaint  Patient presents with   Food bolus    Billy Knox is a 81 y.o. male.  Pt complains of food bolus impaction, 1 hour ago, eating pork tenderloin, known narrowing in esophagus, last dilated 3-4 weeks ago.  Reports history of trigeminal neuralgia resulting in frequently accidentally swallowing food before he has chewed it. Sees Dr. Michail Sermon, called the office who advised to come to ER.       Home Medications Prior to Admission medications   Medication Sig Start Date End Date Taking? Authorizing Provider  acetaminophen (TYLENOL) 500 MG tablet Take 1,000 mg by mouth every 6 (six) hours as needed for mild pain.    [provider]  aspirin 81 MG tablet Take 81 mg by mouth daily.    [provider]  atorvastatin (LIPITOR) 80 MG tablet Take 1 tablet (80 mg total) by mouth daily. 05/07/21   Dunn, Nedra Hai, PA-C  finasteride (PROSCAR) 5 MG tablet Take 5 mg by mouth daily.    [provider]  gabapentin (NEURONTIN) 400 MG capsule TAKE 1 CAPSULE BY MOUTH 4 TIMES DAILY (EVERY 5 HOURS) 08/14/20   Lomax, Amy, NP  Glucosamine HCl (GLUCOSAMINE PO) Take 1,000 mg by mouth daily.    [provider]  LINZESS 145 MCG CAPS capsule Take 145 mcg by mouth every morning. 03/02/20   [provider]  lisinopril (ZESTRIL) 10 MG tablet Take 1 tablet (10 mg total) by mouth daily. 05/22/21   Dunn, Nedra Hai, PA-C  metoprolol succinate (TOPROL-XL) 50 MG 24 hr tablet Take 1 tablet (50 mg total) by mouth daily. 05/07/21   Dunn, Nedra Hai, PA-C  Multiple Vitamin (MULTIVITAMIN WITH MINERALS) TABS tablet Take 1 tablet by mouth daily.    [provider]  neomycin-polymyxin b-dexamethasone (MAXITROL) 3.5-10000-0.1 OINT Place 1 application into the right eye 3 (three) times a week. 03/28/20   [provider]  nitroGLYCERIN  (NITROSTAT) 0.4 MG SL tablet Place 1 tablet (0.4 mg total) under the tongue every 5 (five) minutes as needed for chest pain. 03/17/20   Jettie Booze, MD  Omega-3 Fatty Acids (FISH OIL) 1200 MG CAPS Take 4,800 mg by mouth 2 (two) times daily.     [provider]  Omeprazole (PRILOSEC PO) Take 40 mg by mouth every evening.    [provider]  tamsulosin (FLOMAX) 0.4 MG CAPS capsule Take 1 capsule by mouth daily. 11/14/17   [provider]      Allergies    Patient has no known allergies.    Review of Systems   Review of Systems Negative except as per HPI Physical Exam Updated Vital Signs BP (!) 152/84   Pulse 78   Temp 97.6 F (36.4 C) (Oral)   Resp 15   Ht 6' (1.829 m)   Wt 96.6 kg   SpO2 95%   BMI 28.89 kg/m  Physical Exam Vitals and nursing note reviewed.  Constitutional:      General: He is not in acute distress.    Appearance: He is well-developed. He is not diaphoretic.     Comments: Patient appears uncomfortable, unable to tolerate secretions and frequently spitting into a bucket.  HENT:     Head: Normocephalic and atraumatic.  Pulmonary:     Effort: Pulmonary effort is normal.  Skin:    General: Skin is  warm and dry.     Findings: No erythema or rash.  Neurological:     Mental Status: He is alert and oriented to person, place, and time.  Psychiatric:        Behavior: Behavior normal.     ED Results / Procedures / Treatments   Labs (all labs ordered are listed, but only abnormal results are displayed) Labs Reviewed - No data to display  EKG None  Radiology No results found.  Procedures Procedures    Medications Ordered in ED Medications  lactated ringers infusion ( Intravenous Restarted 07/23/21 1752)    ED Course/ Medical Decision Making/ A&P                           Medical Decision Making  This patient presents to the ED for concern of food bolus impaction, difficulty swallowing, this involves an extensive  number of treatment options, and is a complaint that carries with it a high risk of complications and morbidity.  The differential diagnosis includes impacted food bolus   Co morbidities that complicate the patient evaluation  Esophageal stricture resulting in frequent food bolus impaction, coronary artery disease, hypertension, hyperlipidemia, trigeminal neuralgia, GERD, MI, macular degeneration   Additional history obtained:  Additional history obtained from wife at bedside External records from outside source obtained and reviewed including prior note from Dr. Michail Sermon on 05/31/2021 for food bolus impaction.  Consultations Obtained:  I requested consultation with the GI, Dr. Watt Climes,  and discussed lab and imaging findings as well as pertinent plan - they recommend: Endoscopy   Problem List / ED Course / Critical interventions / Medication management  81 year old male with concern for food bolus impaction with history of same presents emergency room with complaint of difficulty swallowing.  Patient states that he was eating pork tenderloin and now feels this is stuck in his esophagus.  He is unable to tolerate secretions and is spitting into a bucket frequently.  Otherwise in no distress and able to speak in complete sentences.  Discussed possible use of glucagon versus carbonated beverage to assist with passing food bolus however patient states that this is a recurrent problem for him that requires endoscopy for resolution.  Case was discussed with GI on-call and patient was taken to endoscopy lab. I have reviewed the patients home medicines and have made adjustments as needed   Social Determinants of Health:  Sees GI, has PCP, wife at bedside   Test / Admission - Considered:  Sent to endoscopy lab for treatment.         Final Clinical Impression(s) / ED Diagnoses Final diagnoses:  Foreign body in esophagus, initial encounter    Rx / DC Orders ED Discharge Orders      None         Roque Lias 07/23/21 2034    Sherwood Gambler, MD 07/23/21 2303

## 2021-07-23 NOTE — H&P (View-Only) (Signed)
Reason for Consult: Food impaction Referring Physician: ER physician  Billy Knox is an 81 y.o. male.  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   Sudden blockage of esophagus 07/2017   Trigeminal neuralgia     Past Surgical History:  Procedure Laterality Date   CYSTOSCOPY     ESOPHAGOGASTRODUODENOSCOPY     ESOPHAGOGASTRODUODENOSCOPY  06/14/2011   Procedure: ESOPHAGOGASTRODUODENOSCOPY (EGD);  Surgeon: Winfield Cunas., MD;  Location: [facility];  Service: Endoscopy;  Laterality: N/A;   ESOPHAGOGASTRODUODENOSCOPY N/A 07/28/2017   Procedure: ESOPHAGOGASTRODUODENOSCOPY (EGD);  Surgeon: Laurence Spates, MD;  Location: [facility];  Service: Endoscopy;  Laterality: N/A;   ESOPHAGOGASTRODUODENOSCOPY (EGD) WITH PROPOFOL N/A 08/12/2014   Procedure: ESOPHAGOGASTRODUODENOSCOPY (EGD) WITH PROPOFOL;  Surgeon: Laurence Spates, MD;  Location: [facility];  Service: Endoscopy;  Laterality: N/A;   ESOPHAGOGASTRODUODENOSCOPY (EGD) WITH  PROPOFOL N/A 10/29/2016   Procedure: ESOPHAGOGASTRODUODENOSCOPY (EGD) WITH PROPOFOL;  Surgeon: Laurence Spates, MD;  Location: [facility];  Service: Endoscopy;  Laterality: N/A;   ESOPHAGOGASTRODUODENOSCOPY (EGD) WITH PROPOFOL N/A 05/31/2021   Procedure: ESOPHAGOGASTRODUODENOSCOPY (EGD) WITH PROPOFOL;  Surgeon: Wilford Corner, MD;  Location: [facility];  Service: Gastroenterology;  Laterality: N/A;   FOREIGN BODY REMOVAL  07/28/2017   Procedure: FOREIGN BODY REMOVAL;  Surgeon: Laurence Spates, MD;  Location: [facility];  Service: Endoscopy;;   FOREIGN BODY REMOVAL  05/31/2021   Procedure: FOREIGN BODY REMOVAL;  Surgeon: Wilford Corner, MD;  Location: [facility];  Service: Gastroenterology;;   gamma knife     3 yrs ago [place] area around trigeminal nerve area"   PROSTATE BIOPSY     x 3   SAVORY DILATION  06/14/2011   Procedure: SAVORY DILATION;  Surgeon: Winfield Cunas., MD;  Location: [facility];  Service: Endoscopy;  Laterality: N/A;  need xray   SAVORY DILATION N/A 08/12/2014   Procedure: SAVORY DILATION;  Surgeon: Laurence Spates, MD;  Location: [facility];  Service: Endoscopy;  Laterality: N/A;   SAVORY DILATION N/A 10/29/2016   Procedure: SAVORY DILATION;  Surgeon: Laurence Spates, MD;  Location: [facility];  Service: Endoscopy;  Laterality: N/A;   TONSILLECTOMY      child   TRIGEMINAL NERVE BALLOON DECOMPRESSION Right    right ear deafness x 4 total    Family History  Problem Relation Age of Onset  Colon cancer Mother    Heart disease Mother    Liver cancer Sister    Alcohol abuse Father     Social History:  reports that he quit smoking about 54 years ago. His smoking use included cigarettes. He has never used smokeless tobacco. He reports that he does not drink alcohol and does not use drugs.  Allergies: No Known Allergies  Medications: I have reviewed the patient's current medications.  No results found for this or any previous visit (from the past  48 hour(s)).  No results found.  ROS negative except above Blood pressure 140/86, pulse 67, temperature 98 F (36.7 C), temperature source Oral, resp. rate 20, SpO2 98 %. Physical Exam Vital signs stable afebrile no acute distress exam please see preassessment evaluation Assessment/Plan: Food impaction Plan: The procedure was discussed in including the risks with he and his wife and will proceed ASAP with anesthesia assistance with further work-up and plans pending those findings  Darlyn Repsher E 07/23/2021, 4:40 PM

## 2021-07-23 NOTE — Anesthesia Preprocedure Evaluation (Addendum)
Anesthesia Evaluation  Patient identified by MRN, date of birth, ID band Patient awake    Reviewed: Allergy & Precautions, NPO status , Patient's Chart, lab work & pertinent test results, reviewed documented beta blocker date and time   Airway Mallampati: II  TM Distance: >3 FB Neck ROM: Full    Dental  (+) Dental Advisory Given, Caps, Missing   Pulmonary former smoker,    Pulmonary exam normal breath sounds clear to auscultation       Cardiovascular hypertension, Pt. on medications and Pt. on home beta blockers + CAD and + Past MI  Normal cardiovascular exam Rhythm:Regular Rate:Normal  Silent MI 2007  Echo 05/18/21 1. Left ventricular ejection fraction, by estimation, is 55 to 60%. The left ventricle has normal function. The left ventricle has no regional wall motion abnormalities. Left ventricular diastolic parameters are consistent with Grade I diastolic dysfunction (impaired relaxation).  2. Right ventricular systolic function is normal. The right ventricular size is normal. Tricuspid regurgitation signal is inadequate for assessing PA pressure.  3. The mitral valve is degenerative. Trivial mitral valve regurgitation. The mean mitral valve gradient is 2.6 mmHg with average heart rate of 64 bpm.  4. The aortic valve is tricuspid. Aortic valve regurgitation is mild.    Neuro/Psych  Headaches, Hx/o trigeminal neuralgia  Neuromuscular disease negative psych ROS   GI/Hepatic Neg liver ROS, GERD  Medicated,Hx/o esophageal stricture S/P dilation Food impaction   Endo/Other  Hyperlipidemia Obesity  Renal/GU negative Renal ROS  negative genitourinary   Musculoskeletal  (+) Arthritis , Osteoarthritis,    Abdominal (+) + obese,   Peds  Hematology negative hematology ROS (+)   Anesthesia Other Findings Right facial droop  Reproductive/Obstetrics                            Anesthesia  Physical Anesthesia Plan  ASA: 3  Anesthesia Plan: General   Post-op Pain Management:    Induction: Intravenous, Rapid sequence and Cricoid pressure planned  PONV Risk Score and Plan: 2 and Treatment may vary due to age or medical condition and Ondansetron  Airway Management Planned: Oral ETT  Additional Equipment: None  Intra-op Plan:   Post-operative Plan: Extubation in OR  Informed Consent: I have reviewed the patients History and Physical, chart, labs and discussed the procedure including the risks, benefits and alternatives for the proposed anesthesia with the patient or authorized representative who has indicated his/her understanding and acceptance.     Dental advisory given  Plan Discussed with: Anesthesiologist, CRNA and Surgeon  Anesthesia Plan Comments:        Anesthesia Quick Evaluation

## 2021-07-23 NOTE — Consult Note (Signed)
Reason for Consult: Food impaction Referring Physician: ER physician  Billy Knox is an 81 y.o. male.  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   Sudden blockage of esophagus 07/2017   Trigeminal neuralgia     Past Surgical History:  Procedure Laterality Date   CYSTOSCOPY     ESOPHAGOGASTRODUODENOSCOPY     ESOPHAGOGASTRODUODENOSCOPY  06/14/2011   Procedure: ESOPHAGOGASTRODUODENOSCOPY (EGD);  Surgeon: Winfield Cunas., MD;  Location: [facility];  Service: Endoscopy;  Laterality: N/A;   ESOPHAGOGASTRODUODENOSCOPY N/A 07/28/2017   Procedure: ESOPHAGOGASTRODUODENOSCOPY (EGD);  Surgeon: Laurence Spates, MD;  Location: [facility];  Service: Endoscopy;  Laterality: N/A;   ESOPHAGOGASTRODUODENOSCOPY (EGD) WITH PROPOFOL N/A 08/12/2014   Procedure: ESOPHAGOGASTRODUODENOSCOPY (EGD) WITH PROPOFOL;  Surgeon: Laurence Spates, MD;  [facility];  Service: Endoscopy;  Laterality: N/A;   ESOPHAGOGASTRODUODENOSCOPY (EGD) WITH  PROPOFOL N/A 10/29/2016   Procedure: ESOPHAGOGASTRODUODENOSCOPY (EGD) WITH PROPOFOL;  Surgeon: Laurence Spates, MD;  [facility];  Service: Endoscopy;  Laterality: N/A;   ESOPHAGOGASTRODUODENOSCOPY (EGD) WITH PROPOFOL N/A 05/31/2021   Procedure: ESOPHAGOGASTRODUODENOSCOPY (EGD) WITH PROPOFOL;  Surgeon: Wilford Corner, MD;  [facility];  Service: Gastroenterology;  Laterality: N/A;   FOREIGN BODY REMOVAL  07/28/2017   Procedure: FOREIGN BODY REMOVAL;  Surgeon: Laurence Spates, MD;  [facility];  Service: Endoscopy;;   FOREIGN BODY REMOVAL  05/31/2021   Procedure: FOREIGN BODY REMOVAL;  Surgeon: Wilford Corner, MD;  [facility];  Service: Gastroenterology;;   gamma knife     3 yrs ago [place] area around trigeminal nerve area"   PROSTATE BIOPSY     x 3   SAVORY DILATION  06/14/2011   Procedure: SAVORY DILATION;  Surgeon: Winfield Cunas., MD;  [facility];  Service: Endoscopy;  Laterality: N/A;  need xray   SAVORY DILATION N/A 08/12/2014   Procedure: SAVORY DILATION;  Surgeon: Laurence Spates, MD;  [facility];  Service: Endoscopy;  Laterality: N/A;   SAVORY DILATION N/A 10/29/2016   Procedure: SAVORY DILATION;  Surgeon: Laurence Spates, MD;  [facility];  Service: Endoscopy;  Laterality: N/A;   TONSILLECTOMY      child   TRIGEMINAL NERVE BALLOON DECOMPRESSION Right    right ear deafness x 4 total    Family History  Problem Relation Age of Onset  Colon cancer Mother    Heart disease Mother    Liver cancer Sister    Alcohol abuse Father     Social History:  reports that he quit smoking about 54 years ago. His smoking use included cigarettes. He has never used smokeless tobacco. He reports that he does not drink alcohol and does not use drugs.  Allergies: No Known Allergies  Medications: I have reviewed the patient's current medications.  No results found for this or any previous visit (from the past  48 hour(s)).  No results found.  ROS negative except above Blood pressure 140/86, pulse 67, temperature 98 F (36.7 C), temperature source Oral, resp. rate 20, SpO2 98 %. Physical Exam Vital signs stable afebrile no acute distress exam please see preassessment evaluation Assessment/Plan: Food impaction Plan: The procedure was discussed in including the risks with he and his wife and will proceed ASAP with anesthesia assistance with further work-up and plans pending those findings  Raoul Ciano E 07/23/2021, 4:40 PM

## 2021-07-23 NOTE — ED Triage Notes (Signed)
Pt reports eating a piece of pork approx 1 hour ago and being unable to sallow the meat. Pt spitting up spit and unable to swallow in triage. Pt reports trying to pas with water and no success.

## 2021-07-23 NOTE — Transfer of Care (Signed)
Immediate Anesthesia Transfer of Care Note  Patient: Billy Knox  Procedure(s) Performed: ESOPHAGOSCOPY  Patient Location: PACU  Anesthesia Type:General  Level of Consciousness: awake, alert  and oriented  Airway & Oxygen Therapy: Patient Spontanous Breathing and Patient connected to face mask oxygen  Post-op Assessment: Report given to RN and Post -op Vital signs reviewed and stable  Post vital signs: Reviewed and stable  Last Vitals:  Vitals Value Taken Time  BP    Temp    Pulse    Resp    SpO2      Last Pain:  Vitals:   07/23/21 1640  TempSrc: Temporal  PainSc: 0-No pain         Complications: No notable events documented.

## 2021-07-23 NOTE — ED Provider Triage Note (Signed)
Emergency Medicine Provider Triage Evaluation Note  Billy Knox , a 81 y.o. male  was evaluated in triage.  Pt complains of food bolus impaction, 1 hour ago, eating pork tenderloin, known narrowing in esophagus, last dilated 3-4 weeks ago. Sees Dr. Michail Sermon, called the office who advised to come to ER Review of Systems  Positive: Difficulty swallowing Negative: CP, SHOB  Physical Exam  BP 140/86 (BP Location: Left Arm)   Pulse 67   Temp 98 F (36.7 C) (Oral)   Resp 20   SpO2 100%  Gen:   Awake, no distress   Resp:  Normal effort  MSK:   Moves extremities without difficulty  Other:    Medical Decision Making  Medically screening exam initiated at 3:01 PM.  Appropriate orders placed.  [name] was informed that the remainder of the evaluation will be completed by another provider, this initial triage assessment does not replace that evaluation, and the importance of remaining in the ED until their evaluation is complete.  GI paged for consult     Tacy Learn, PA-C 07/23/21 [id number]

## 2021-07-24 ENCOUNTER — Encounter (HOSPITAL_COMMUNITY): Payer: Self-pay | Admitting: Gastroenterology

## 2021-07-31 DIAGNOSIS — H35373 Puckering of macula, bilateral: Secondary | ICD-10-CM | POA: Diagnosis not present

## 2021-07-31 DIAGNOSIS — H353113 Nonexudative age-related macular degeneration, right eye, advanced atrophic without subfoveal involvement: Secondary | ICD-10-CM | POA: Diagnosis not present

## 2021-07-31 DIAGNOSIS — H353124 Nonexudative age-related macular degeneration, left eye, advanced atrophic with subfoveal involvement: Secondary | ICD-10-CM | POA: Diagnosis not present

## 2021-07-31 DIAGNOSIS — H43813 Vitreous degeneration, bilateral: Secondary | ICD-10-CM | POA: Diagnosis not present

## 2021-08-08 ENCOUNTER — Other Ambulatory Visit: Payer: Self-pay

## 2021-08-08 DIAGNOSIS — M792 Neuralgia and neuritis, unspecified: Secondary | ICD-10-CM

## 2021-08-08 DIAGNOSIS — G500 Trigeminal neuralgia: Secondary | ICD-10-CM

## 2021-08-08 DIAGNOSIS — M5481 Occipital neuralgia: Secondary | ICD-10-CM

## 2021-08-08 MED ORDER — GABAPENTIN 400 MG PO CAPS: ORAL_CAPSULE | ORAL | 0 refills | Status: DC

## 2021-08-15 NOTE — Patient Instructions (Signed)
Below is our plan:  We will continue gabapentin '400mg'$  four times daily. Let me know if you have trouble sleeping due to pain. We can consider adding '400mg'$  at 3am.   Please make sure you are staying well hydrated. I recommend 50-60 ounces daily. Well balanced diet and regular exercise encouraged. Consistent sleep schedule with 6-8 hours recommended.   Please continue follow up with care team as directed.   Follow up with me in 1 year   You may receive a survey regarding today's visit. I encourage you to leave honest feed back as I do use this information to improve patient care. Thank you for seeing me today!

## 2021-08-15 NOTE — Progress Notes (Unsigned)
No chief complaint on file.   HISTORY OF PRESENT ILLNESS:  08/15/21 ALL:  Billy Knox returns for follow up for trigeminal neuralgia. He continues gabapentin '400mg'$  TID.   08/14/2020 ALL: Billy Knox is a 81 y.o. male here today for follow up for trigeminal neuralgia. He was switched to Gralise, however, he did not feel it worked as well as gabapentin. He continues gabapentin '400mg'$  TID. He has weaned lamotrigine . He has not taken this medication in over a year and feels he has been doing well. If he misses a dose of gabapentin, pain becomes unbearable in about 2 hours. He has an alarm for 7am, 12pm, 5pm and 10pm. He has more pain in the morning and can tell its time for his medication. He does not wish to adjust dose due to feeling groggy. He has intermittent burning of the right occipital region. He see a chiropractor who uses acupuncture and pressure point treatment that helps. He is followed by ophthalmology for macular degeneration.    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  In the past he had done well with 300 mg gabapentin 4-5 times a day but if he misses a dose or is delayed the pain comes back, he not only has trigeminal neuralgia but he also has neuropathic pain from surgery.  We have tried multiple medications, gabapentin, Lamictal, at this time we will try to increase his gabapentin but he has been having a hard time tolerating it not sure he can tolerate higher IR, we will also try to get gabapentin extended release.   Interval history 10/02/2017 AA: He is here for follow up of Trigeminal Neuralgia. Extensive history of this including surgeries and failure of multiple meds including botox. He is on Gabapentin and  Lamictal. He says he feels worse with increased area of pain on the right in the trigeminal area, and in the incision site. If he is having a bad day he takes Gabapentin. Still having ififculty touching face, especially shaving. He has lost more taste.  He also feels he has lost smell and taste. He denies any tremors of the hands or falls. He has a slight head tremor that he notices nothing significant. He walking well, no falls. Discussed he is not parkinsonian on exam. Otherwise for TGN, the medicine is working, if he misses the pain gets worse. Feels pain is "under control" currently. Discussed increased Gabapentin can take it every 3 hours as needed, may increase Lamictal as well. Discussed. He has ben to [facility] and multiple other institutions. Injecting alcohol into the nerve helped once.   Interval history 09/24/2016 AA: botulinum toxin in the right trigeminal area did not help. He has continued pain in the are of the scar and now getting more pain in the occipital area, sharp, burning since the surgery suspect injury to the occipital nerve. He takes a '300mg'$  gabapentin every 5.5 hours which helps. He has not tried Lamictal, baclofen, topiramate, valproate. Will try Lamictal   HPI:  Billy Knox is a 81 y.o. male here as a referral from Dr. Rex Kras for trigeminal neuralgia. 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 He is status post gamma knife  radiosurgical rhizotomy in 2012 and recent dorsal root entry zone surgery. He has had 4 surgeries as far back as the 90s. Started 25 years ago first surgery was in 1992. He has seen in December at [facility] and his neurontin was increased and he had a fall. He lost feeling in his left lip after recent fall (see CT below). Yesterday he had pain like a hot poker through the scalp (Right high parietal lobe) and now pain around the right  side of the ear. Since the DREZ he still gets flashes of pain in the right side of the face. Feels like someone has hit him all the time. Throbbing and pounding. With light and sound sensitivity and nausea. No medication overuse. He says the trigeminal nerve pain shoots into all three zones (v1,v2,v3). When he first got the trigeminal neuralgia food would trigger it. He has also been to the head of Neurology at [facility] as well as [facility]. Unknown triggers, sporadic, sometimes touching his face with cause the pain, turning the head, wind and chewing. Lightning, severe, brief. He has an aching pain on the right which is constant. Like a pressure. He has daily headaches and half of them a month are migrainous (unilateral right side of head, light, sound sensitivity, pounding, throbbing, nausea) ongoing at this frequency for years. No aura. No medication overuse. He also has neck pain and stiff muscles on the right with decreased ROM, slowly progressive, cannot use muscle relaxers due to side effects and sedation and risk of falls, has tried massage and PT without help, pain and decreased ROM.   Meds tried include: Oxcarbazepine, Vimpat, gabapentin, clonazepam, tiagabine, oxycodone, flexeril, tramadol, naltrexone, Tegretol(did not help).   Reviewed notes, labs and imaging from outside physicians, which showed:   CBC showed elevated white blood cells 3 weeks ago 67.6 with neutrophilic predominance otherwise normal. CMP showed normal sodium, chloride 100, glucose 108, total protein 6 otherwise unremarkable.   Personally reviewed images CT of the head and agree with the following:   IMPRESSION: CT head 02/26/2016 (in ED for a fall): Atrophy with supratentorial small vessel disease. No intracranial mass hemorrhage, or extra-axial fluid collection. No acute infarct evident. Previous surgical removal a portion of the lateral right occipital bone. Areas of arterial vascular calcification noted.    Primary diagnosis patient is suffering from trigeminal neuralgia for several decades. He was operated with microvascular decompression several times also treated with gamma knife without much effect. He recently stopped oxycodone. He was recently treated with DREZ by Dr. Mearl Latin and is complaining about postprocedural resurgence of neuralgic pain in V2 and also retro-orbital pain always on the right side. Diagnosed with postprocedural flare up. They added low-dose naltrexone increased gabapentin and also added Vimpat. Repeating his neuro imaging was suggested.   REVIEW OF SYSTEMS: Out of a complete 14 system review of symptoms, the patient complains only of the following symptoms,difficulty with vision, neuropathic pain and all other reviewed systems are negative.   ALLERGIES: No Known Allergies   HOME MEDICATIONS: Outpatient Medications Prior to Visit  Medication Sig Dispense Refill   acetaminophen (TYLENOL) 500 MG tablet Take 1,000 mg by mouth every 6 (six) hours as needed for mild pain.     aspirin 81 MG tablet Take 81 mg by mouth daily.     atorvastatin (LIPITOR) 80 MG tablet Take 1 tablet (80 mg total) by mouth daily. 90 tablet 1   finasteride (PROSCAR) 5 MG tablet Take 5 mg by mouth daily.  gabapentin (NEURONTIN) 400 MG capsule TAKE 1 CAPSULE BY MOUTH 4 TIMES DAILY (EVERY 5 HOURS) 360 capsule 0   Glucosamine HCl (GLUCOSAMINE PO) Take 1,000 mg by mouth daily.     LINZESS 145 MCG CAPS capsule Take 145 mcg by mouth every morning.     lisinopril (ZESTRIL) 10 MG tablet Take 1 tablet (10 mg total) by mouth daily. 90 tablet 3   metoprolol succinate (TOPROL-XL) 50 MG 24 hr tablet Take 1 tablet (50 mg total) by mouth daily. 90 tablet 3   Multiple Vitamin (MULTIVITAMIN WITH MINERALS) TABS tablet Take 1 tablet by mouth daily.     neomycin-polymyxin b-dexamethasone (MAXITROL) 3.5-10000-0.1 OINT Place 1 application into the right eye 3 (three) times a week.     nitroGLYCERIN (NITROSTAT) 0.4 MG SL  tablet Place 1 tablet (0.4 mg total) under the tongue every 5 (five) minutes as needed for chest pain. 25 tablet 6   Omega-3 Fatty Acids (FISH OIL) 1200 MG CAPS Take 4,800 mg by mouth 2 (two) times daily.      Omeprazole (PRILOSEC PO) Take 40 mg by mouth every evening.     tamsulosin (FLOMAX) 0.4 MG CAPS capsule Take 1 capsule by mouth daily.  11   No facility-administered medications prior to visit.     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   Sudden blockage of esophagus 07/2017   Trigeminal neuralgia      PAST SURGICAL HISTORY: Past Surgical History:  Procedure Laterality Date   CYSTOSCOPY     ESOPHAGOGASTRODUODENOSCOPY     ESOPHAGOGASTRODUODENOSCOPY  06/14/2011   Procedure: ESOPHAGOGASTRODUODENOSCOPY (EGD);  Surgeon: Winfield Cunas., MD;  Location: [facility];  Service: Endoscopy;  Laterality: N/A;   ESOPHAGOGASTRODUODENOSCOPY N/A 07/28/2017   Procedure: ESOPHAGOGASTRODUODENOSCOPY (EGD);  Surgeon: Laurence Spates, MD;  Location: [facility];  Service: Endoscopy;  Laterality: N/A;   ESOPHAGOGASTRODUODENOSCOPY (EGD) WITH PROPOFOL N/A 08/12/2014   Procedure: ESOPHAGOGASTRODUODENOSCOPY (EGD) WITH PROPOFOL;  Surgeon: Laurence Spates, MD;  [facility];  Service: Endoscopy;  Laterality: N/A;   ESOPHAGOGASTRODUODENOSCOPY (EGD) WITH PROPOFOL N/A 10/29/2016   Procedure: ESOPHAGOGASTRODUODENOSCOPY (EGD) WITH PROPOFOL;  Surgeon:  Laurence Spates, MD;  [facility];  Service: Endoscopy;  Laterality: N/A;   ESOPHAGOGASTRODUODENOSCOPY (EGD) WITH PROPOFOL N/A 05/31/2021   Procedure: ESOPHAGOGASTRODUODENOSCOPY (EGD) WITH PROPOFOL;  Surgeon: Wilford Corner, MD;  [facility];  Service: Gastroenterology;  Laterality: N/A;   ESOPHAGOSCOPY N/A 07/23/2021   Procedure: ESOPHAGOSCOPY;  Surgeon: Clarene Essex, MD;  [facility];  Service: Gastroenterology;  Laterality: N/A;   FOREIGN BODY REMOVAL  07/28/2017   Procedure: FOREIGN BODY REMOVAL;  Surgeon: Laurence Spates, MD;  [facility];  Service: Endoscopy;;   FOREIGN BODY REMOVAL  05/31/2021   Procedure: FOREIGN BODY REMOVAL;  Surgeon: Wilford Corner, MD;  [facility];  Service: Gastroenterology;;  gamma knife     3 yrs ago [place] area around trigeminal nerve area"   PROSTATE BIOPSY     x 3   SAVORY DILATION  06/14/2011   Procedure: SAVORY DILATION;  Surgeon: Winfield Cunas., MD;  [facility];  Service: Endoscopy;  Laterality: N/A;  need xray   SAVORY DILATION N/A 08/12/2014   Procedure: SAVORY DILATION;  Surgeon: Laurence Spates, MD;  [facility];  Service: Endoscopy;  Laterality: N/A;   SAVORY DILATION N/A 10/29/2016   Procedure: SAVORY DILATION;  Surgeon: Laurence Spates, MD;  [facility];  Service: Endoscopy;  Laterality: N/A;   TONSILLECTOMY      child   TRIGEMINAL NERVE BALLOON DECOMPRESSION Right    right ear deafness x 4 total     FAMILY HISTORY: Family History  Problem Relation Age of Onset   Colon cancer Mother    Heart disease Mother    Liver cancer Sister    Alcohol abuse Father      SOCIAL HISTORY: Social History   Socioeconomic History   Marital status: Married    Spouse name: Not on file   Number of children: 1   Years of education: 12   Highest education level: Not on file  Occupational History   Occupation: Retired  Tobacco Use   Smoking status:  Former    Types: Cigarettes    Quit date: 06/14/1967    Years since quitting: 54.2   Smokeless tobacco: Never  Vaping Use   Vaping Use: Never used  Substance and Sexual Activity   Alcohol use: No    Alcohol/week: 1.0 standard drink of alcohol    Types: 1 Standard drinks or equivalent per week   Drug use: No   Sexual activity: Not on file  Other Topics Concern   Not on file  Social History Narrative   Lives at home w/ his wife   Right-handed   Caffeine: 2 cups per day   Social Determinants of Health   Financial Resource Strain: Not on file  Food Insecurity: Not on file  Transportation Needs: Not on file  Physical Activity: Not on file  Stress: Not on file  Social Connections: Not on file  Intimate Partner Violence: Not on file     PHYSICAL EXAM  There were no vitals filed for this visit.  There is no height or weight on file to calculate BMI.   Generalized: Well developed, in no acute distress  Cardiology: normal rate and rhythm, no murmur auscultated  Respiratory: clear to auscultation bilaterally    Neurological examination  Mentation: Alert oriented to time, place, history taking. Follows all commands speech and language fluent Cranial nerve II-XII: Pupils were equal round reactive to light. Extraocular movements were full, visual field were full on confrontational test. Facial sensation and strength were normal. Head turning and shoulder shrug  were normal and symmetric. Motor: The motor testing reveals 5 over 5 strength of all 4 extremities. Good symmetric motor tone is noted throughout.  Gait and station: Gait is normal.    DIAGNOSTIC DATA (LABS, IMAGING, TESTING) - I reviewed patient records, labs, notes, testing and imaging myself where available.  Lab Results  Component Value Date   WBC 8.2 05/07/2021   HGB 14.4 05/07/2021   HCT 42.8 05/07/2021   MCV 93 05/07/2021   PLT 188 05/07/2021      Component Value Date/Time   NA 141 05/22/2021 1022   K  4.2 05/22/2021 1022   CL  103 05/22/2021 1022   CO2 27 05/22/2021 1022   GLUCOSE 101 (H) 05/22/2021 1022   GLUCOSE 108 (H) 02/26/2016 1112   BUN 13 05/22/2021 1022   CREATININE 0.86 05/22/2021 1022   CALCIUM 8.8 05/22/2021 1022   PROT 5.8 (L) 05/22/2021 1022   ALBUMIN 4.0 05/22/2021 1022   AST 19 05/22/2021 1022   ALT 24 05/22/2021 1022   ALKPHOS 56 05/22/2021 1022   BILITOT 0.7 05/22/2021 1022   GFRNONAA >60 02/26/2016 1112   GFRAA >60 02/26/2016 1112   Lab Results  Component Value Date   CHOL 127 03/19/2017   HDL 34 (L) 03/19/2017   LDLCALC 74 03/19/2017   TRIG 97 03/19/2017   CHOLHDL 3.7 03/19/2017   No results found for: "HGBA1C" No results found for: "VITAMINB12" Lab Results  Component Value Date   TSH 4.050 05/07/2021        No data to display               No data to display           ASSESSMENT AND PLAN  81 y.o. year old male  has a past medical history of Cancer ([place]), Colon polyp, Coronary artery disease, Deafness in right ear, Esophageal stricture, GERD (gastroesophageal reflux disease), Hyperchloremia, Hypertension, Macular degeneration, Myocardial infarction ([place]) (2007), Ringing in ears, Rt facial numbness, Sacroiliac inflammation ([place]), Sudden blockage of esophagus (07/2017), and Trigeminal neuralgia. here with    No diagnosis found.  Kenner is doing well, today. He feels trigeminal and occipital pain are well managed on gabapentin '400mg'$  TID. We will continue current treatment plan. He will continue close follow up with care team. Healthy lifestyle habits encouraged. He will follow up in 1 year, sooner if needed.   No orders of the defined types were placed in this encounter.     No orders of the defined types were placed in this encounter.      Debbora Presto, MSN, FNP-C 08/15/2021, 4:09 PM  [facility] [address] [place], [place] [address] [phone]

## 2021-08-16 ENCOUNTER — Ambulatory Visit: Payer: Medicare Other | Admitting: Family Medicine

## 2021-08-16 ENCOUNTER — Encounter: Payer: Self-pay | Admitting: Family Medicine

## 2021-08-16 VITALS — BP 143/84 | HR 65 | Ht 72.0 in | Wt 217.0 lb

## 2021-08-16 DIAGNOSIS — M5481 Occipital neuralgia: Secondary | ICD-10-CM

## 2021-08-16 DIAGNOSIS — M792 Neuralgia and neuritis, unspecified: Secondary | ICD-10-CM | POA: Diagnosis not present

## 2021-08-16 DIAGNOSIS — G500 Trigeminal neuralgia: Secondary | ICD-10-CM | POA: Diagnosis not present

## 2021-08-16 MED ORDER — GABAPENTIN 400 MG PO CAPS: ORAL_CAPSULE | ORAL | 3 refills | Status: DC

## 2021-08-19 ENCOUNTER — Emergency Department (EMERGENCY_DEPARTMENT_HOSPITAL): Payer: Medicare Other | Admitting: Certified Registered Nurse Anesthetist

## 2021-08-19 ENCOUNTER — Encounter (HOSPITAL_COMMUNITY)
Admission: EM | Disposition: A | Discharge status: Home / Self Care | Payer: Self-pay | Source: Home / Self Care | Attending: Emergency Medicine

## 2021-08-19 ENCOUNTER — Emergency Department (HOSPITAL_COMMUNITY): Payer: Medicare Other | Admitting: Certified Registered Nurse Anesthetist

## 2021-08-19 ENCOUNTER — Encounter (HOSPITAL_COMMUNITY): Payer: Self-pay

## 2021-08-19 ENCOUNTER — Ambulatory Visit (HOSPITAL_COMMUNITY)
Admission: EM | Admit: 2021-08-19 | Discharge: 2021-08-19 | Disposition: A | Discharge status: Home / Self Care | Payer: Medicare Other | Attending: Emergency Medicine | Admitting: Emergency Medicine

## 2021-08-19 ENCOUNTER — Other Ambulatory Visit: Payer: Self-pay

## 2021-08-19 DIAGNOSIS — I2510 Atherosclerotic heart disease of native coronary artery without angina pectoris: Secondary | ICD-10-CM | POA: Insufficient documentation

## 2021-08-19 DIAGNOSIS — X58XXXA Exposure to other specified factors, initial encounter: Secondary | ICD-10-CM | POA: Insufficient documentation

## 2021-08-19 DIAGNOSIS — K2100 Gastro-esophageal reflux disease with esophagitis, without bleeding: Secondary | ICD-10-CM | POA: Diagnosis not present

## 2021-08-19 DIAGNOSIS — Z79899 Other long term (current) drug therapy: Secondary | ICD-10-CM | POA: Diagnosis not present

## 2021-08-19 DIAGNOSIS — I10 Essential (primary) hypertension: Secondary | ICD-10-CM | POA: Diagnosis not present

## 2021-08-19 DIAGNOSIS — K56699 Other intestinal obstruction unspecified as to partial versus complete obstruction: Secondary | ICD-10-CM

## 2021-08-19 DIAGNOSIS — Z7982 Long term (current) use of aspirin: Secondary | ICD-10-CM | POA: Diagnosis not present

## 2021-08-19 DIAGNOSIS — T18128A Food in esophagus causing other injury, initial encounter: Secondary | ICD-10-CM | POA: Insufficient documentation

## 2021-08-19 DIAGNOSIS — I252 Old myocardial infarction: Secondary | ICD-10-CM | POA: Diagnosis not present

## 2021-08-19 DIAGNOSIS — K222 Esophageal obstruction: Secondary | ICD-10-CM | POA: Insufficient documentation

## 2021-08-19 DIAGNOSIS — T182XXA Foreign body in stomach, initial encounter: Secondary | ICD-10-CM | POA: Diagnosis not present

## 2021-08-19 DIAGNOSIS — K209 Esophagitis, unspecified without bleeding: Secondary | ICD-10-CM | POA: Diagnosis not present

## 2021-08-19 DIAGNOSIS — Z87891 Personal history of nicotine dependence: Secondary | ICD-10-CM | POA: Insufficient documentation

## 2021-08-19 DIAGNOSIS — K56609 Unspecified intestinal obstruction, unspecified as to partial versus complete obstruction: Secondary | ICD-10-CM | POA: Diagnosis present

## 2021-08-19 HISTORY — PX: FOREIGN BODY REMOVAL: SHX962

## 2021-08-19 HISTORY — PX: ESOPHAGOGASTRODUODENOSCOPY: SHX5428

## 2021-08-19 SURGERY — EGD (ESOPHAGOGASTRODUODENOSCOPY)
Anesthesia: General

## 2021-08-19 MED ORDER — GLUCAGON HCL RDNA (DIAGNOSTIC) 1 MG IJ SOLR
1.0000 mg | Freq: Once | INTRAMUSCULAR | Status: AC
  Administered 2021-08-19: 1 mg via INTRAVENOUS
  Filled 2021-08-19: qty 1

## 2021-08-19 MED ORDER — PHENYLEPHRINE 80 MCG/ML (10ML) SYRINGE FOR IV PUSH (FOR BLOOD PRESSURE SUPPORT)
PREFILLED_SYRINGE | INTRAVENOUS | Status: DC | PRN
  Administered 2021-08-19: 80 ug via INTRAVENOUS
  Administered 2021-08-19: 160 ug via INTRAVENOUS

## 2021-08-19 MED ORDER — PROPOFOL 10 MG/ML IV BOLUS
INTRAVENOUS | Status: DC | PRN
  Administered 2021-08-19: 120 mg via INTRAVENOUS
  Administered 2021-08-19: 20 mg via INTRAVENOUS

## 2021-08-19 MED ORDER — SUCCINYLCHOLINE CHLORIDE 200 MG/10ML IV SOSY
PREFILLED_SYRINGE | INTRAVENOUS | Status: DC | PRN
  Administered 2021-08-19: 140 mg via INTRAVENOUS

## 2021-08-19 MED ORDER — LIDOCAINE HCL (CARDIAC) PF 100 MG/5ML IV SOSY
PREFILLED_SYRINGE | INTRAVENOUS | Status: DC | PRN
  Administered 2021-08-19: 100 mg via INTRAVENOUS

## 2021-08-19 MED ORDER — ONDANSETRON HCL 4 MG/2ML IJ SOLN
4.0000 mg | Freq: Once | INTRAMUSCULAR | Status: AC
  Administered 2021-08-19: 4 mg via INTRAVENOUS
  Filled 2021-08-19: qty 2

## 2021-08-19 MED ORDER — PROPOFOL 10 MG/ML IV BOLUS
INTRAVENOUS | Status: AC
  Filled 2021-08-19: qty 20

## 2021-08-19 MED ORDER — FENTANYL CITRATE (PF) 100 MCG/2ML IJ SOLN: 25.0000 ug | INTRAMUSCULAR | Status: DC | PRN

## 2021-08-19 MED ORDER — LACTATED RINGERS IV SOLN: INTRAVENOUS | Status: DC

## 2021-08-19 MED ORDER — ONDANSETRON HCL 4 MG/2ML IJ SOLN
INTRAMUSCULAR | Status: DC | PRN
  Administered 2021-08-19: 4 mg via INTRAVENOUS

## 2021-08-19 MED ORDER — ONDANSETRON HCL 4 MG/2ML IJ SOLN: 4.0000 mg | Freq: Once | INTRAMUSCULAR | Status: DC | PRN

## 2021-08-19 MED ORDER — PANTOPRAZOLE SODIUM 40 MG IV SOLR
40.0000 mg | Freq: Once | INTRAVENOUS | Status: AC
  Administered 2021-08-19: 40 mg via INTRAVENOUS
  Filled 2021-08-19: qty 10

## 2021-08-19 NOTE — ED Provider Notes (Signed)
[facility] DEPT Provider Note   CSN: [id number] Arrival date & time: 08/19/21  1340     History  Chief Complaint  Patient presents with   Foreign Body in [place] is a 81 y.o. male.  HPI Patient presents for impacted food bolus.  Medical history includes CAD, HTN, esophageal stricture, dysphagia, GERD, HLD.  Last month he had a food bolus.  He has had 4 episodes of food bolus in the past.  Most recently, he underwent EGD with Dr. Watt Climes 1 month ago.  Although no bolus was present at time of scope, he was found to have esophageal stenosis and grade a erosive esophagitis without bleeding.  He has not had any GI procedures since that time.  At approximately 1 PM, patient was eating lunch (pulled pork and barbecue chicken).  He felt a globus sensation at the level of his lower esophagus.  Symptoms are reminiscent of prior episodes of food bolus.  He attempted to swallow water but was unable to.  He has since not been able to swallow anything, including his oral secretions.  Patient denies any other concerns at this time.  Home Medications Prior to Admission medications   Medication Sig Start Date End Date Taking? Authorizing Provider  acetaminophen (TYLENOL) 500 MG tablet Take 1,000 mg by mouth every 6 (six) hours as needed for mild pain.   Yes [provider]  aspirin 81 MG tablet Take 81 mg by mouth daily.   Yes [provider]  atorvastatin (LIPITOR) 80 MG tablet Take 1 tablet (80 mg total) by mouth daily. 05/07/21  Yes Dunn, Dayna N, PA-C  finasteride (PROSCAR) 5 MG tablet Take 5 mg by mouth daily.   Yes [provider]  gabapentin (NEURONTIN) 400 MG capsule TAKE 1 CAPSULE BY MOUTH 4 TIMES DAILY (EVERY 5 HOURS) 08/16/21  Yes Lomax, Amy, NP  Glucosamine HCl (GLUCOSAMINE PO) Take 1,000 mg by mouth daily.   Yes [provider]  LINZESS 145 MCG CAPS capsule Take 145 mcg by mouth every morning. 03/02/20  Yes  [provider]  lisinopril (ZESTRIL) 10 MG tablet Take 1 tablet (10 mg total) by mouth daily. 05/22/21  Yes Dunn, Dayna N, PA-C  metoprolol succinate (TOPROL-XL) 50 MG 24 hr tablet Take 1 tablet (50 mg total) by mouth daily. 05/07/21  Yes Dunn, Dayna N, PA-C  Multiple Vitamin (MULTIVITAMIN WITH MINERALS) TABS tablet Take 1 tablet by mouth daily.   Yes [provider]  nitroGLYCERIN (NITROSTAT) 0.4 MG SL tablet Place 1 tablet (0.4 mg total) under the tongue every 5 (five) minutes as needed for chest pain. 03/17/20  Yes Jettie Booze, MD  Omega-3 Fatty Acids (FISH OIL) 1200 MG CAPS Take 1,200 mg by mouth in the morning.   Yes [provider]  Omeprazole (PRILOSEC PO) Take 40 mg by mouth every evening.   Yes [provider]  RESTASIS 0.05 % ophthalmic emulsion Place 1 drop into both eyes 2 (two) times daily. 06/19/21  Yes [provider]  tamsulosin (FLOMAX) 0.4 MG CAPS capsule Take 1 capsule by mouth every evening. 11/14/17  Yes [provider]      Allergies    Patient has no known allergies.    Review of Systems   Review of Systems  Gastrointestinal:  Positive for vomiting.  All other systems reviewed and are negative.   Physical Exam Updated Vital Signs BP 117/61   Pulse 67   Temp  97.7 F (36.5 C)   Resp (!) 22   Ht 6' (1.829 m)   Wt 98.4 kg   SpO2 96%   BMI 29.43 kg/m  Physical Exam Vitals and nursing note reviewed.  Constitutional:      General: He is not in acute distress.    Appearance: Normal appearance. He is well-developed. He is not ill-appearing, toxic-appearing or diaphoretic.  HENT:     Head: Normocephalic and atraumatic.     Right Ear: External ear normal.     Left Ear: External ear normal.     Mouth/Throat:     Mouth: Mucous membranes are moist.     Pharynx: Oropharynx is clear.  Eyes:     Extraocular Movements: Extraocular movements intact.     Conjunctiva/sclera: Conjunctivae normal.  Cardiovascular:      Rate and Rhythm: Normal rate and regular rhythm.     Heart sounds: No murmur heard. Pulmonary:     Effort: Pulmonary effort is normal. No respiratory distress.     Breath sounds: Normal breath sounds. No wheezing or rales.  Chest:     Chest wall: No tenderness.  Abdominal:     General: There is no distension.     Palpations: Abdomen is soft.     Tenderness: There is no abdominal tenderness.  Musculoskeletal:        General: No swelling. Normal range of motion.     Cervical back: Normal range of motion and neck supple.  Skin:    General: Skin is warm and dry.     Coloration: Skin is not jaundiced or pale.  Neurological:     General: No focal deficit present.     Mental Status: He is alert and oriented to person, place, and time.     Cranial Nerves: No cranial nerve deficit.     Sensory: No sensory deficit.     Motor: No weakness.     Coordination: Coordination normal.  Psychiatric:        Mood and Affect: Mood normal.        Behavior: Behavior normal.        Thought Content: Thought content normal.        Judgment: Judgment normal.     ED Results / Procedures / Treatments   Labs (all labs ordered are listed, but only abnormal results are displayed) Labs Reviewed - No data to display  EKG None  Radiology No results found.  Procedures Procedures    Medications Ordered in ED Medications  glucagon (human recombinant) (GLUCAGEN) injection 1 mg (1 mg Intravenous Given 08/19/21 1537)  pantoprazole (PROTONIX) injection 40 mg (40 mg Intravenous Given 08/19/21 1534)  ondansetron (ZOFRAN) injection 4 mg (4 mg Intravenous Given 08/19/21 1534)    ED Course/ Medical Decision Making/ A&P Clinical Course as of 08/20/21 0912  Sun Aug 19, 2021  1641 Upon reassessment, the patient reports that he tried to drink cola and was unsuccessful.  He states that he vomited up a cola and still feels foreign body sensation in his chest.  He states that his nausea is well controlled as he is  trying to drink and does not need any additional antiemetics at this time.  I will reach out to Dr. Alessandra Bevels of GI for further recommendations. [VK]  4742 Dr. Alessandra Bevels plans for EGD this evening to relieve the obstruction. [VK]  1927 GI updated but they want to be able to perform endoscopy around 10 PM tonight.  Patient was updated.  He declines  any further nausea medicine.  He denies any improvement of his foreign body sensation. [VK]    Clinical Course User Index [VK] Ottie Glazier, DO                           Medical Decision Making Risk Prescription drug management. Decision regarding hospitalization.   This patient presents to the ED for concern of food bolus, this involves an extensive number of treatment options, and is a complaint that carries with it a high risk of complications and morbidity.  The differential diagnosis includes food bolus, GERD, esophagitis   Co morbidities that complicate the patient evaluation  CAD, HTN, esophageal stricture, dysphagia, GERD, HLD   Additional history obtained:  Additional history obtained from patient's wife External records from outside source obtained and reviewed including EMR  Consultations Obtained:  I requested consultation with the gastroenterologist, Dr. Alessandra Bevels,  and discussed lab and imaging findings as well as pertinent plan - they recommend: Trial of glucagon, Zofran, Protonix, and p.o. Coca-Cola.  EGD if this is unsuccessful.   Problem List / ED Course / Critical interventions / Medication management  Patient is a pleasant 81 year old male presenting for food bolus.  This occurred approximately 1 hour prior to arrival.  He has 4 prior episodes this year.  Most recent EGD was 1 month ago when he came in for similar symptoms.  Today, patient reports that he was eating chicken and pork.  He developed a globus sensation in the area of his distal esophagus.  He has since been unable to tolerate any fluids.  He is  well-appearing on arrival.  Vital signs are normal.  His breathing is unlabored.  I spoke with gastroenterologist on-call, Dr. Alessandra Bevels.  Dr. Alessandra Bevels recommends trial of medications and p.o. challenge with Coca-Cola.  If this is unsuccessful, patient will require EGD.  Patient was ordered glucagon, Zofran, Protonix, and maintenance IV fluids.  Prior to p.o. challenge, care of patient was signed out to oncoming ED provider. I ordered medication including Zofran, Protonix, glucagon for food bolus Reevaluation of the patient after these medicines showed that the patient stayed the same I have reviewed the patients home medicines and have made adjustments as needed   Social Determinants of Health:  Has access to outpatient care         Final Clinical Impression(s) / ED Diagnoses Final diagnoses:  Food bolus obstruction of intestine [facility])    Rx / DC Orders ED Discharge Orders     None         Godfrey Pick, MD 08/20/21 [phone]

## 2021-08-19 NOTE — Anesthesia Preprocedure Evaluation (Signed)
Anesthesia Evaluation  Patient identified by MRN, date of birth, ID band Patient awake    Reviewed: Allergy & Precautions, NPO status , Patient's Chart, lab work & pertinent test results  Airway Mallampati: II  TM Distance: >3 FB Neck ROM: Full    Dental no notable dental hx.    Pulmonary neg pulmonary ROS, former smoker,    Pulmonary exam normal breath sounds clear to auscultation       Cardiovascular hypertension, + Past MI  Normal cardiovascular exam Rhythm:Regular Rate:Normal     Neuro/Psych negative neurological ROS  negative psych ROS   GI/Hepatic negative GI ROS, Neg liver ROS,   Endo/Other  negative endocrine ROS  Renal/GU negative Renal ROS  negative genitourinary   Musculoskeletal negative musculoskeletal ROS (+)   Abdominal   Peds negative pediatric ROS (+)  Hematology negative hematology ROS (+)   Anesthesia Other Findings   Reproductive/Obstetrics negative OB ROS                             Anesthesia Physical Anesthesia Plan  ASA: 3 and emergent  Anesthesia Plan: General   Post-op Pain Management:    Induction: Intravenous and Rapid sequence  PONV Risk Score and Plan: 2 and Ondansetron and Treatment may vary due to age or medical condition  Airway Management Planned: Oral ETT  Additional Equipment:   Intra-op Plan:   Post-operative Plan: Extubation in OR  Informed Consent: I have reviewed the patients History and Physical, chart, labs and discussed the procedure including the risks, benefits and alternatives for the proposed anesthesia with the patient or authorized representative who has indicated his/her understanding and acceptance.     Dental advisory given  Plan Discussed with: CRNA and Surgeon  Anesthesia Plan Comments:         Anesthesia Quick Evaluation

## 2021-08-19 NOTE — Transfer of Care (Signed)
Immediate Anesthesia Transfer of Care Note  Patient: Billy Knox  Procedure(s) Performed: ESOPHAGOGASTRODUODENOSCOPY (EGD) FOREIGN BODY REMOVAL  Patient Location: Endoscopy Unit  Anesthesia Type:General  Level of Consciousness: awake, drowsy and patient cooperative  Airway & Oxygen Therapy: Patient Spontanous Breathing and Patient connected to face mask oxygen  Post-op Assessment: Report given to RN and Post -op Vital signs reviewed and stable  Post vital signs: Reviewed and stable  Last Vitals:  Vitals Value Taken Time  BP 143/66 2247  Temp    Pulse 73 2247  Resp 14 2247  SpO2 99% 2249    Last Pain:  Vitals:   08/19/21 2125  TempSrc: Temporal  PainSc: 5          Complications: No notable events documented.

## 2021-08-19 NOTE — Discharge Instructions (Signed)
Endoscopy Care After Please read the instructions outlined below and refer to this sheet in the next few weeks. These discharge instructions provide you with general information on caring for yourself after you leave the hospital. Your doctor may also give you specific instructions. While your treatment has been planned according to the most current medical practices available, unavoidable complications occasionally occur. If you have any problems or questions after discharge, please call Dr. Paulita Fujita [facility]) at [phone].  HOME CARE INSTRUCTIONS Activity You may resume your regular activity but move at a slower pace for the next 24 hours.  Take frequent rest periods for the next 24 hours.  Walking will help expel (get rid of) the air and reduce the bloated feeling in your abdomen.  No driving for 24 hours (because of the anesthesia (medicine) used during the test).  You may shower.  Do not sign any important legal documents or operate any machinery for 24 hours (because of the anesthesia used during the test).  Nutrition Drink plenty of fluids.  PUREED DIET ONLY until further notice  Avoid alcoholic beverages for 24 hours or as instructed by your caregiver.  Medications You may resume your normal medications unless your caregiver tells you otherwise. What you can expect today You may experience abdominal discomfort such as a feeling of fullness or "gas" pains.  You may experience a sore throat for 2 to 3 days. This is normal. Gargling with salt water may help this.   SEEK IMMEDIATE MEDICAL CARE IF: You have excessive nausea (feeling sick to your stomach) and/or vomiting.  You have severe abdominal pain and distention (swelling).  You have trouble swallowing.  You have a temperature over 100 F (37.8 C).  You have rectal bleeding or vomiting of blood.  Document Released: 08/15/2003 Document Revised: 09/12/2010 Document Reviewed: 02/25/2007 [facility] Patient Information  2012 [place].

## 2021-08-19 NOTE — Interval H&P Note (Signed)
History and Physical Interval Note:  08/19/2021 9:49 PM  Billy Knox  has presented today for surgery, with the diagnosis of food bolus.  The various methods of treatment have been discussed with the patient and family. After consideration of risks, benefits and other options for treatment, the patient has consented to  Procedure(s): ESOPHAGOGASTRODUODENOSCOPY (EGD) (N/A) FOREIGN BODY REMOVAL (N/A) as a surgical intervention.  The patient's history has been reviewed, patient examined, no change in status, stable for surgery.  I have reviewed the patient's chart and labs.  Questions were answered to the patient's satisfaction.     Billy Knox  Patient presents with third food impaction in less than three months. 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  He had some chicken today out at a dinner and immediately got it stuck, after several weeks of very closely watching his diet.  Plan:   Endoscopy with possible foreign body removal. Risks (bleeding, infection, bowel perforation that could require surgery, sedation-related changes in cardiopulmonary systems), benefits (identification and possible treatment of source of symptoms, exclusion of certain causes of symptoms), and alternatives (watchful waiting, radiographic imaging studies, empiric medical treatment) of upper endoscopy (EGD) were explained to patient/family in detail and patient wishes to proceed.

## 2021-08-19 NOTE — Anesthesia Postprocedure Evaluation (Signed)
Anesthesia Post Note  Patient: Billy Knox  Procedure(s) Performed: ESOPHAGOGASTRODUODENOSCOPY (EGD) FOREIGN BODY REMOVAL     Patient location during evaluation: PACU Anesthesia Type: General Level of consciousness: awake and alert Pain management: pain level controlled Vital Signs Assessment: post-procedure vital signs reviewed and stable Respiratory status: spontaneous breathing, nonlabored ventilation, respiratory function stable and patient connected to nasal cannula oxygen Cardiovascular status: blood pressure returned to baseline and stable Postop Assessment: no apparent nausea or vomiting Anesthetic complications: no   No notable events documented.  Last Vitals:  Vitals:   08/19/21 2250 08/19/21 2300  BP: (!) 160/72 (!) 150/81  Pulse: 70 69  Resp: 15 18  Temp:    SpO2: 98% 95%    Last Pain:  Vitals:   08/19/21 2248  TempSrc:   PainSc: 0-No pain                 Meline Russaw S

## 2021-08-19 NOTE — Anesthesia Procedure Notes (Signed)
Procedure Name: Intubation Date/Time: 08/19/2021 9:59 PM  Performed by: Raenette Rover, CRNAPre-anesthesia Checklist: Patient identified, Emergency Drugs available, Suction available and Patient being monitored Patient Re-evaluated:Patient Re-evaluated prior to induction Oxygen Delivery Method: Circle system utilized Preoxygenation: Pre-oxygenation with 100% oxygen Induction Type: IV induction, Rapid sequence and Cricoid Pressure applied Laryngoscope Size: Mac and 4 Grade View: Grade I Tube type: Oral Tube size: 7.5 mm Number of attempts: 1 Airway Equipment and Method: Stylet Placement Confirmation: ETT inserted through vocal cords under direct vision, positive ETCO2 and breath sounds checked- equal and bilateral Secured at: 23 cm Tube secured with: Tape Dental Injury: Teeth and Oropharynx as per pre-operative assessment

## 2021-08-19 NOTE — ED Provider Notes (Signed)
Signed Out to me at 1500 by Dr. Doren Custard pending reassessment after medication management of his food bolus.  At the time of my evaluation, the patient reports continued foreign body sensation in his chest.  He just received the glucagon, Protonix and Zofran.  He states that he has had food bolus stuck in his throat 3 times in the last few months that have all needed removal by endoscopy.  Patient's awake and alert and overall well-appearing at this time in no acute distress he will be reassessed and I will touch base with GI.   Oneal Deputy K, DO 08/20/21 0000

## 2021-08-19 NOTE — ED Triage Notes (Signed)
Pt states he has a piece of chicken stuck in his throat from eating lunch about 1 hour ago. Pt able to speak in full sentences, denies any respiratory symptoms. Pt is, however, drooling in triage. Pt had his esophagus stretched about 3 weeks ago.

## 2021-08-19 NOTE — Op Note (Signed)
[facility] Patient Name: Billy Knox Procedure Date: 08/19/2021 MRN: [id number] Attending MD: Arta Silence , MD Date of Birth: 1940/05/24 CSN: [id number] Age: 81 Admit Type: Outpatient Procedure:                Upper GI endoscopy Indications:              Foreign body in the esophagus Providers:                Arta Silence, MD, Grace Isaac, RN, Benetta Spar, Technician Referring MD:              Medicines:                General Anesthesia Complications:            No immediate complications. Estimated Blood Loss:     Estimated blood loss: none. Procedure:                Pre-Anesthesia Assessment:                           - Prior to the procedure, a History and Physical                            was performed, and patient medications and                            allergies were reviewed. The patient's tolerance of                            previous anesthesia was also reviewed. The risks                            and benefits of the procedure and the sedation                            options and risks were discussed with the patient.                            All questions were answered, and informed consent                            was obtained. Prior Anticoagulants: The patient has                            taken no previous anticoagulant or antiplatelet                            agents. ASA Grade Assessment: III - A patient with                            severe systemic disease. After reviewing the risks  and benefits, the patient was deemed in                            satisfactory condition to undergo the procedure.                           After obtaining informed consent, the endoscope was                            passed under direct vision. Throughout the                            procedure, the patient's blood pressure, pulse, and                            oxygen saturations  were monitored continuously. The                            [id number]-[id number] ([id number]) Olympus endoscope was introduced                            through the mouth, and advanced to the second part                            of duodenum. The upper GI endoscopy was                            accomplished without difficulty. The patient                            tolerated the procedure well. Scope In: Scope Out: Findings:      Food was found in the middle third of the esophagus and in the lower       third of the esophagus. Removal of food was accomplished.      LA Grade B (one or more mucosal breaks greater than 5 mm, not extending       between the tops of two mucosal folds) esophagitis was found.      One benign-appearing, intrinsic mild stenosis was found. The stenosis       was traversed.      The exam of the esophagus was otherwise normal.      A medium amount of food (residue) was found in the gastric fundus, in       the gastric body and in the gastric antrum.      The exam of the stomach was otherwise normal.      The duodenal bulb, first portion of the duodenum and second portion of       the duodenum were normal. Impression:               - Food in the middle third of the esophagus and in                            the lower third of the esophagus. Removal was  successful.                           - LA Grade B esophagitis.                           - Benign-appearing esophageal stenosis.                           - A medium amount of food (residue) in the stomach.                           - Normal duodenal bulb, first portion of the                            duodenum and second portion of the duodenum. Moderate Sedation:      None Recommendation:           - Patient has a contact number available for                            emergencies. The signs and symptoms of potential                            delayed complications were discussed with the                             patient. Return to normal activities tomorrow.                            Written discharge instructions were provided to the                            patient.                           - Pureed diet until further notice. Do NOT eat                            solid food.                           - Continue present medications.                           - Ensure patient is on PPI (OTC Prilosec 20 mg po                            qd, or an equivalent).                           - Return to GI clinic in 4 weeks.                           - Return to referring physician as previously  scheduled. Procedure Code(s):        --- Professional ---                           [phone], Esophagogastroduodenoscopy, flexible,                            transoral; with removal of foreign body(s) Diagnosis Code(s):        --- Professional ---                           C37.628B, Food in esophagus causing other injury,                            initial encounter                           K20.90, Esophagitis, unspecified without bleeding                           K22.2, Esophageal obstruction                           T18.2XXA, Foreign body in stomach, initial encounter                           T18.108A, Unspecified foreign body in esophagus                            causing other injury, initial encounter CPT copyright 2019 [employer]. All rights reserved. The codes documented in this report are preliminary and upon coder review may  be revised to meet current compliance requirements. Arta Silence, MD 08/19/2021 10:50:54 PM This report has been signed electronically. Number of Addenda: 0

## 2021-08-20 ENCOUNTER — Encounter (HOSPITAL_COMMUNITY): Payer: Self-pay | Admitting: Gastroenterology

## 2021-08-23 DIAGNOSIS — K224 Dyskinesia of esophagus: Secondary | ICD-10-CM | POA: Diagnosis not present

## 2021-08-23 DIAGNOSIS — K222 Esophageal obstruction: Secondary | ICD-10-CM | POA: Diagnosis not present

## 2021-08-23 DIAGNOSIS — G500 Trigeminal neuralgia: Secondary | ICD-10-CM | POA: Diagnosis not present

## 2021-08-23 DIAGNOSIS — R1310 Dysphagia, unspecified: Secondary | ICD-10-CM | POA: Diagnosis not present

## 2021-09-06 DIAGNOSIS — U071 COVID-19: Secondary | ICD-10-CM | POA: Diagnosis not present

## 2021-10-01 DIAGNOSIS — L84 Corns and callosities: Secondary | ICD-10-CM | POA: Diagnosis not present

## 2021-10-01 DIAGNOSIS — L603 Nail dystrophy: Secondary | ICD-10-CM | POA: Diagnosis not present

## 2021-10-01 DIAGNOSIS — I739 Peripheral vascular disease, unspecified: Secondary | ICD-10-CM | POA: Diagnosis not present

## 2021-10-28 ENCOUNTER — Other Ambulatory Visit: Payer: Self-pay | Admitting: Physician Assistant

## 2021-11-13 DIAGNOSIS — K5900 Constipation, unspecified: Secondary | ICD-10-CM | POA: Diagnosis not present

## 2021-11-13 DIAGNOSIS — K219 Gastro-esophageal reflux disease without esophagitis: Secondary | ICD-10-CM | POA: Diagnosis not present

## 2021-11-13 DIAGNOSIS — K224 Dyskinesia of esophagus: Secondary | ICD-10-CM | POA: Diagnosis not present

## 2021-12-13 DIAGNOSIS — N401 Enlarged prostate with lower urinary tract symptoms: Secondary | ICD-10-CM | POA: Diagnosis not present

## 2021-12-13 DIAGNOSIS — R3912 Poor urinary stream: Secondary | ICD-10-CM | POA: Diagnosis not present

## 2021-12-13 DIAGNOSIS — Z125 Encounter for screening for malignant neoplasm of prostate: Secondary | ICD-10-CM | POA: Diagnosis not present

## 2022-01-18 DIAGNOSIS — L603 Nail dystrophy: Secondary | ICD-10-CM | POA: Diagnosis not present

## 2022-01-18 DIAGNOSIS — L84 Corns and callosities: Secondary | ICD-10-CM | POA: Diagnosis not present

## 2022-01-18 DIAGNOSIS — I739 Peripheral vascular disease, unspecified: Secondary | ICD-10-CM | POA: Diagnosis not present

## 2022-01-29 DIAGNOSIS — H35373 Puckering of macula, bilateral: Secondary | ICD-10-CM | POA: Diagnosis not present

## 2022-01-29 DIAGNOSIS — H353124 Nonexudative age-related macular degeneration, left eye, advanced atrophic with subfoveal involvement: Secondary | ICD-10-CM | POA: Diagnosis not present

## 2022-01-29 DIAGNOSIS — H353113 Nonexudative age-related macular degeneration, right eye, advanced atrophic without subfoveal involvement: Secondary | ICD-10-CM | POA: Diagnosis not present

## 2022-01-29 DIAGNOSIS — H43813 Vitreous degeneration, bilateral: Secondary | ICD-10-CM | POA: Diagnosis not present

## 2022-02-19 DIAGNOSIS — K080 Exfoliation of teeth due to systemic causes: Secondary | ICD-10-CM | POA: Diagnosis not present

## 2022-03-05 ENCOUNTER — Emergency Department (HOSPITAL_COMMUNITY): Payer: PRIVATE HEALTH INSURANCE | Admitting: Certified Registered Nurse Anesthetist

## 2022-03-05 ENCOUNTER — Encounter (HOSPITAL_COMMUNITY)
Admission: EM | Disposition: A | Discharge status: Home / Self Care | Payer: Self-pay | Source: Home / Self Care | Attending: Emergency Medicine

## 2022-03-05 ENCOUNTER — Encounter (HOSPITAL_COMMUNITY): Payer: Self-pay | Admitting: Emergency Medicine

## 2022-03-05 ENCOUNTER — Emergency Department (HOSPITAL_COMMUNITY)
Admission: EM | Admit: 2022-03-05 | Discharge: 2022-03-06 | Disposition: A | Discharge status: Home / Self Care | Payer: PRIVATE HEALTH INSURANCE | Attending: Emergency Medicine | Admitting: Emergency Medicine

## 2022-03-05 ENCOUNTER — Other Ambulatory Visit: Payer: Self-pay

## 2022-03-05 ENCOUNTER — Emergency Department (EMERGENCY_DEPARTMENT_HOSPITAL): Payer: PRIVATE HEALTH INSURANCE | Admitting: Certified Registered Nurse Anesthetist

## 2022-03-05 DIAGNOSIS — I10 Essential (primary) hypertension: Secondary | ICD-10-CM | POA: Insufficient documentation

## 2022-03-05 DIAGNOSIS — K222 Esophageal obstruction: Secondary | ICD-10-CM | POA: Diagnosis not present

## 2022-03-05 DIAGNOSIS — Z87891 Personal history of nicotine dependence: Secondary | ICD-10-CM | POA: Insufficient documentation

## 2022-03-05 DIAGNOSIS — K2100 Gastro-esophageal reflux disease with esophagitis, without bleeding: Secondary | ICD-10-CM | POA: Insufficient documentation

## 2022-03-05 DIAGNOSIS — W44F3XA Food entering into or through a natural orifice, initial encounter: Secondary | ICD-10-CM | POA: Insufficient documentation

## 2022-03-05 DIAGNOSIS — E782 Mixed hyperlipidemia: Secondary | ICD-10-CM | POA: Insufficient documentation

## 2022-03-05 DIAGNOSIS — T18128A Food in esophagus causing other injury, initial encounter: Secondary | ICD-10-CM | POA: Insufficient documentation

## 2022-03-05 DIAGNOSIS — Z7982 Long term (current) use of aspirin: Secondary | ICD-10-CM | POA: Insufficient documentation

## 2022-03-05 DIAGNOSIS — K209 Esophagitis, unspecified without bleeding: Secondary | ICD-10-CM | POA: Diagnosis not present

## 2022-03-05 DIAGNOSIS — I252 Old myocardial infarction: Secondary | ICD-10-CM

## 2022-03-05 DIAGNOSIS — I2510 Atherosclerotic heart disease of native coronary artery without angina pectoris: Secondary | ICD-10-CM | POA: Diagnosis not present

## 2022-03-05 DIAGNOSIS — Z79899 Other long term (current) drug therapy: Secondary | ICD-10-CM | POA: Diagnosis not present

## 2022-03-05 DIAGNOSIS — K449 Diaphragmatic hernia without obstruction or gangrene: Secondary | ICD-10-CM | POA: Diagnosis not present

## 2022-03-05 DIAGNOSIS — Z8249 Family history of ischemic heart disease and other diseases of the circulatory system: Secondary | ICD-10-CM | POA: Insufficient documentation

## 2022-03-05 DIAGNOSIS — R1310 Dysphagia, unspecified: Secondary | ICD-10-CM | POA: Diagnosis not present

## 2022-03-05 HISTORY — PX: FOREIGN BODY REMOVAL: SHX962

## 2022-03-05 HISTORY — PX: ESOPHAGOGASTRODUODENOSCOPY: SHX5428

## 2022-03-05 SURGERY — EGD (ESOPHAGOGASTRODUODENOSCOPY)
Anesthesia: General

## 2022-03-05 MED ORDER — PHENYLEPHRINE 80 MCG/ML (10ML) SYRINGE FOR IV PUSH (FOR BLOOD PRESSURE SUPPORT)
PREFILLED_SYRINGE | INTRAVENOUS | Status: DC | PRN
  Administered 2022-03-05: 80 ug via INTRAVENOUS
  Administered 2022-03-05: 120 ug via INTRAVENOUS
  Administered 2022-03-05 (×5): 80 ug via INTRAVENOUS

## 2022-03-05 MED ORDER — PROPOFOL 10 MG/ML IV BOLUS
INTRAVENOUS | Status: AC
  Filled 2022-03-05: qty 20

## 2022-03-05 MED ORDER — ONDANSETRON HCL 4 MG/2ML IJ SOLN
4.0000 mg | Freq: Once | INTRAMUSCULAR | Status: AC
  Administered 2022-03-05: 4 mg via INTRAVENOUS
  Filled 2022-03-05: qty 2

## 2022-03-05 MED ORDER — FENTANYL CITRATE (PF) 100 MCG/2ML IJ SOLN
INTRAMUSCULAR | Status: DC | PRN
  Administered 2022-03-05: 50 ug via INTRAVENOUS

## 2022-03-05 MED ORDER — LIDOCAINE 2% (20 MG/ML) 5 ML SYRINGE
INTRAMUSCULAR | Status: DC | PRN
  Administered 2022-03-05: 100 mg via INTRAVENOUS

## 2022-03-05 MED ORDER — ONDANSETRON HCL 4 MG/2ML IJ SOLN
INTRAMUSCULAR | Status: DC | PRN
  Administered 2022-03-05: 4 mg via INTRAVENOUS

## 2022-03-05 MED ORDER — PROPOFOL 10 MG/ML IV BOLUS
INTRAVENOUS | Status: DC | PRN
  Administered 2022-03-05: 150 mg via INTRAVENOUS

## 2022-03-05 MED ORDER — SUCCINYLCHOLINE CHLORIDE 200 MG/10ML IV SOSY
PREFILLED_SYRINGE | INTRAVENOUS | Status: DC | PRN
  Administered 2022-03-05: 140 mg via INTRAVENOUS

## 2022-03-05 MED ORDER — SODIUM CHLORIDE 0.9 % IV SOLN: INTRAVENOUS | Status: DC

## 2022-03-05 MED ORDER — GLUCAGON HCL RDNA (DIAGNOSTIC) 1 MG IJ SOLR
1.0000 mg | Freq: Once | INTRAMUSCULAR | Status: AC
  Administered 2022-03-05: 1 mg via INTRAVENOUS
  Filled 2022-03-05: qty 1

## 2022-03-05 MED ORDER — DEXAMETHASONE SODIUM PHOSPHATE 10 MG/ML IJ SOLN
INTRAMUSCULAR | Status: DC | PRN
  Administered 2022-03-05: 5 mg via INTRAVENOUS

## 2022-03-05 MED ORDER — FENTANYL CITRATE (PF) 100 MCG/2ML IJ SOLN
INTRAMUSCULAR | Status: AC
  Filled 2022-03-05: qty 2

## 2022-03-05 MED ORDER — LACTATED RINGERS IV SOLN: INTRAVENOUS | Status: DC | PRN

## 2022-03-05 NOTE — Consult Note (Signed)
[facility] Gastroenterology Consultation Note  Referring Provider: Emergency Department Elvina Sidle) Primary Care Physician:  Cari Caraway, MD Primary Gastroenterologist:  Dr. Michail Sermon  Reason for Consultation:  food impaction  HPI: Billy Knox is a 82 y.o. male presenting dysphagia, sialorrhea, foreign body sensation.  After eating hamburger few hours ago.  Multiple prior episodes of food impactions.     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   Sudden blockage of esophagus 07/2017   Trigeminal neuralgia     Past Surgical History:  Procedure Laterality Date   CYSTOSCOPY     ESOPHAGOGASTRODUODENOSCOPY     ESOPHAGOGASTRODUODENOSCOPY  06/14/2011   Procedure: ESOPHAGOGASTRODUODENOSCOPY (EGD);  Surgeon: Winfield Cunas., MD;  Location: [facility];  Service: Endoscopy;  Laterality: N/A;   ESOPHAGOGASTRODUODENOSCOPY N/A 07/28/2017   Procedure: ESOPHAGOGASTRODUODENOSCOPY (EGD);  Surgeon: Laurence Spates, MD;  Location: [facility];  Service: Endoscopy;  Laterality: N/A;   ESOPHAGOGASTRODUODENOSCOPY N/A 08/19/2021   Procedure: ESOPHAGOGASTRODUODENOSCOPY (EGD);  Surgeon: Arta Silence, MD;  Location: [facility];  Service: Gastroenterology;  Laterality: N/A;   ESOPHAGOGASTRODUODENOSCOPY (EGD) WITH PROPOFOL N/A 08/12/2014   Procedure: ESOPHAGOGASTRODUODENOSCOPY (EGD) WITH PROPOFOL;  Surgeon: Laurence Spates, MD;  [facility]: [facility];  Service: Endoscopy;  Laterality:  N/A;   ESOPHAGOGASTRODUODENOSCOPY (EGD) WITH PROPOFOL N/A 10/29/2016   Procedure: ESOPHAGOGASTRODUODENOSCOPY (EGD) WITH PROPOFOL;  Surgeon: Laurence Spates, MD;  [facility];  Service: Endoscopy;  Laterality: N/A;   ESOPHAGOGASTRODUODENOSCOPY (EGD) WITH PROPOFOL N/A 05/31/2021   Procedure: ESOPHAGOGASTRODUODENOSCOPY (EGD) WITH PROPOFOL;  Surgeon: Wilford Corner, MD;  [facility]: [facility];  Service: Gastroenterology;  Laterality: N/A;   ESOPHAGOSCOPY N/A 07/23/2021   Procedure: ESOPHAGOSCOPY;  Surgeon: Clarene Essex, MD;  [facility];  Service: Gastroenterology;  Laterality: N/A;   FOREIGN BODY REMOVAL  07/28/2017   Procedure: FOREIGN BODY REMOVAL;  Surgeon: Laurence Spates, MD;  [facility];  Service: Endoscopy;;   FOREIGN BODY REMOVAL  05/31/2021   Procedure: FOREIGN BODY REMOVAL;  Surgeon: Wilford Corner, MD;  [facility];  Service: Gastroenterology;;   FOREIGN BODY REMOVAL N/A 08/19/2021   Procedure: FOREIGN BODY REMOVAL;  Surgeon: Arta Silence, MD;  [facility];  Service: Gastroenterology;  Laterality: N/A;   gamma knife     3 yrs ago [place] area around trigeminal nerve area"   PROSTATE BIOPSY     x 3   SAVORY DILATION  06/14/2011   Procedure: SAVORY DILATION;  Surgeon: Winfield Cunas., MD;  [facility];  Service: Endoscopy;  Laterality: N/A;  need xray   SAVORY DILATION N/A 08/12/2014   Procedure: SAVORY DILATION;  Surgeon: Laurence Spates, MD;  [facility];  Service: Endoscopy;  Laterality: N/A;   SAVORY DILATION N/A 10/29/2016   Procedure: SAVORY DILATION;  Surgeon: Laurence Spates, MD;  [facility];  Service: Endoscopy;  Laterality: N/A;  TONSILLECTOMY      child   TRIGEMINAL NERVE BALLOON DECOMPRESSION Right    right ear deafness x 4 total    Prior to Admission medications   Medication Sig Start Date End Date Taking? Authorizing Provider  acetaminophen (TYLENOL) 500 MG tablet Take  500-1,000 mg by mouth every 6 (six) hours as needed for mild pain or headache.   Yes [provider]  aspirin 81 MG tablet Take 81 mg by mouth in the morning.   Yes [provider]  atorvastatin (LIPITOR) 80 MG tablet Take 1 tablet by mouth once daily Patient taking differently: Take 80 mg by mouth at bedtime. 10/30/21  Yes Jettie Booze, MD  finasteride (PROSCAR) 5 MG tablet Take 5 mg by mouth daily.   Yes [provider]  gabapentin (NEURONTIN) 400 MG capsule TAKE 1 CAPSULE BY MOUTH 4 TIMES DAILY (EVERY 5 HOURS) Patient taking differently: Take 400 mg by mouth See admin instructions. Take 400 mg by mouth every 5 hours 08/16/21  Yes Lomax, Amy, NP  Glucosamine HCl (GLUCOSAMINE PO) Take 1,000 mg by mouth daily.   Yes [provider]  LINZESS 145 MCG CAPS capsule Take 145 mcg by mouth every morning. 03/02/20  Yes [provider]  lisinopril (ZESTRIL) 10 MG tablet Take 1 tablet (10 mg total) by mouth daily. 05/22/21  Yes Dunn, Dayna N, PA-C  metoprolol succinate (TOPROL-XL) 50 MG 24 hr tablet Take 1 tablet (50 mg total) by mouth daily. 05/07/21  Yes Dunn, Dayna N, PA-C  Multiple Vitamin (MULTIVITAMIN WITH MINERALS) TABS tablet Take 1 tablet by mouth daily.   Yes [provider]  nitroGLYCERIN (NITROSTAT) 0.4 MG SL tablet Place 1 tablet (0.4 mg total) under the tongue every 5 (five) minutes as needed for chest pain. 03/17/20  Yes Jettie Booze, MD  Omega-3 Fatty Acids (FISH OIL) 1200 MG CAPS Take 1,200 mg by mouth in the morning.   Yes [provider]  PRILOSEC OTC 20 MG tablet Take 20 mg by mouth at bedtime.   Yes [provider]  RESTASIS 0.05 % ophthalmic emulsion Place 1 drop into both eyes at bedtime. 06/19/21  Yes [provider]  tamsulosin (FLOMAX) 0.4 MG CAPS capsule Take 0.4 mg by mouth every evening. 11/14/17  Yes [provider]    No current facility-administered medications for this encounter.    Facility-Administered Medications Ordered in Other Encounters  Medication Dose Route Frequency Provider Last Rate Last Admin   lactated ringers infusion   Intravenous Continuous PRN Montel Clock, CRNA   New Bag at 03/05/22 2225    Allergies as of 03/05/2022   (No Known Allergies)    Family History  Problem Relation Age of Onset   Colon cancer Mother    Heart disease Mother    Liver cancer Sister    Alcohol abuse Father     Social History   Socioeconomic History   Marital status: Married    Spouse name: Not on file   Number of children: 1   Years of education: 12   Highest education level: Not on file  Occupational History   Occupation: Retired  Tobacco Use   Smoking status: Former    Types: Cigarettes    Quit date: 06/14/1967    Years since quitting: 54.7   Smokeless tobacco: Never  Vaping Use   Vaping Use: Never used  Substance and Sexual Activity   Alcohol use: No    Alcohol/week: 1.0 standard drink of  alcohol    Types: 1 Standard drinks or equivalent per week   Drug use: No   Sexual activity: Not on file  Other Topics Concern   Not on file  Social History Narrative   Lives at home w/ his wife   Right-handed   Caffeine: 2 cups per day   Social Determinants of Health   Financial Resource Strain: Not on file  Food Insecurity: Not on file  Transportation Needs: Not on file  Physical Activity: Not on file  Stress: Not on file  Social Connections: Not on file  Intimate Partner Violence: Not on file    Review of Systems: As per HPI, all others negative  Physical Exam: Vital signs in last 24 hours: Temp:  [97 F (36.1 C)-98.1 F (36.7 C)] 97 F (36.1 C) (02/20 2216) Pulse Rate:  [67-73] 73 (02/20 2216) Resp:  [18] 18 (02/20 1959) BP: (152-166)/(66-84) 152/66 (02/20 2216) SpO2:  [98 %] 98 % (02/20 1959) Weight:  [96.6 kg] 96.6 kg (02/20 2216)   General:   Alert,  Well-developed, well-nourished, pleasant and cooperative in NAD Head:   Normocephalic and atraumatic. Eyes:  Sclera clear, no icterus.   Conjunctiva pink. Ears:  Normal auditory acuity. Nose:  No deformity, discharge,  or lesions. Mouth:  No deformity or lesions.  Oropharynx pink & moist. Neck:  Supple; no masses or thyromegaly. Lungs:  No respiratory distress Abdomen:  Soft, nontender and nondistended. No masses, hepatosplenomegaly or hernias noted. Normal bowel sounds, without guarding, and without rebound.     Msk:  Symmetrical without gross deformities. Normal posture. Pulses:  Normal pulses noted. Extremities:  Without clubbing or edema. Neurologic:  Alert and  oriented x4;  grossly normal neurologically. Skin:  Intact without significant lesions or rashes. Psych:  Alert and cooperative. Normal mood and affect.   Lab Results: No results for input(s): "WBC", "HGB", "HCT", "PLT" in the last 72 hours. BMET No results for input(s): "NA", "K", "CL", "CO2", "GLUCOSE", "BUN", "CREATININE", "CALCIUM" in the last 72 hours. LFT No results for input(s): "PROT", "ALBUMIN", "AST", "ALT", "ALKPHOS", "BILITOT", "BILIDIR", "IBILI" in the last 72 hours. PT/INR No results for input(s): "LABPROT", "INR" in the last 72 hours.  Studies/Results: No results found.  Impression:  Suspected recurrent esophageal food impaction.  Esophageal stricture/dysmotility.   Plan:   Endoscopy with foreign body removal. Risks (bleeding, infection, bowel perforation that could require surgery, sedation-related changes in cardiopulmonary systems), benefits (identification and possible treatment of source of symptoms, exclusion of certain causes of symptoms), and alternatives (watchful waiting, radiographic imaging studies, empiric medical treatment) of upper endoscopy (EGD) were explained to patient/family in detail and patient wishes to proceed.    LOS: 0 days   Brexlee Heberlein M  03/05/2022, 10:42 PM  Cell [phone] If no answer or after 5 PM call [phone]

## 2022-03-05 NOTE — ED Provider Notes (Signed)
[facility] Provider Note  CSN: [id number] Arrival date & time: 03/05/22 1950  Chief Complaint(s) No chief complaint on file.  HPI Billy Knox is a 82 y.o. male with history of esophageal stricture, hypertension, hyperlipidemia, coronary artery disease presenting to the emergency department with sensation of food impaction.  Patient reports he was eating hamburger around 5 PM when he developed a sensation of food being stuck in his esophagus.  He reports he has tried drinking soda, jumping up and down, having his wife's lap is back without any improvement.  He reports is similar to previous episodes.  He called his GI office who advised him to come to the ER.  He reports he has been spitting up his saliva.  No difficulty breathing.   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   Sudden blockage of esophagus 07/2017   Trigeminal neuralgia    Patient Active Problem List   Diagnosis Date Noted   Food impaction of esophagus 05/31/2021   Dysphagia 05/31/2021   Chronic migraine without aura, not intractable, without status migrainosus 03/31/2016   Fall 03/07/2016   Mixed hyperlipidemia 03/24/2013   Coronary artery disease    Hypertension    Hyperchloremia    Esophageal stricture    Colon polyp    Sacroiliac inflammation (HCC)    Trigeminal neuralgia    Deafness in right ear    Rt facial numbness    Home  Medication(s) Prior to Admission medications   Medication Sig Start Date End Date Taking? Authorizing Provider  acetaminophen (TYLENOL) 500 MG tablet Take 500-1,000 mg by mouth every 6 (six) hours as needed for mild pain or headache.   Yes [provider]  aspirin 81 MG tablet Take 81 mg by mouth in the morning.   Yes [provider]  atorvastatin (LIPITOR) 80 MG tablet Take 1 tablet by mouth once daily Patient taking differently: Take 80 mg by mouth at bedtime. 10/30/21  Yes Jettie Booze, MD  finasteride (PROSCAR) 5 MG tablet Take 5 mg by mouth daily.   Yes [provider]  gabapentin (NEURONTIN) 400 MG capsule TAKE 1 CAPSULE BY MOUTH 4 TIMES DAILY (EVERY 5 HOURS) Patient taking differently: Take 400 mg by mouth See admin instructions. Take 400 mg by mouth every 5 hours 08/16/21  Yes Lomax, Amy, NP  Glucosamine HCl (GLUCOSAMINE PO) Take 1,000 mg by mouth daily.   Yes [provider]  LINZESS 145 MCG CAPS capsule Take 145 mcg by mouth every morning. 03/02/20  Yes [provider]  lisinopril (ZESTRIL) 10 MG tablet Take 1 tablet (10 mg total) by mouth daily. 05/22/21  Yes Dunn, Dayna N, PA-C  metoprolol succinate (TOPROL-XL) 50 MG 24 hr tablet Take 1 tablet (50 mg total) by mouth daily. 05/07/21  Yes Dunn,  Dayna N, PA-C  Multiple Vitamin (MULTIVITAMIN WITH MINERALS) TABS tablet Take 1 tablet by mouth daily.   Yes [provider]  nitroGLYCERIN (NITROSTAT) 0.4 MG SL tablet Place 1 tablet (0.4 mg total) under the tongue every 5 (five) minutes as needed for chest pain. 03/17/20  Yes Jettie Booze, MD  Omega-3 Fatty Acids (FISH OIL) 1200 MG CAPS Take 1,200 mg by mouth in the morning.   Yes [provider]  PRILOSEC OTC 20 MG tablet Take 20 mg by mouth at bedtime.   Yes [provider]  RESTASIS 0.05 % ophthalmic emulsion Place 1 drop into both eyes at bedtime. 06/19/21  Yes [provider]  tamsulosin (FLOMAX) 0.4 MG  CAPS capsule Take 0.4 mg by mouth every evening. 11/14/17  Yes [provider]                                                                                                                                    Past Surgical History Past Surgical History:  Procedure Laterality Date   CYSTOSCOPY     ESOPHAGOGASTRODUODENOSCOPY     ESOPHAGOGASTRODUODENOSCOPY  06/14/2011   Procedure: ESOPHAGOGASTRODUODENOSCOPY (EGD);  Surgeon: Winfield Cunas., MD;  Location: [facility];  Service: Endoscopy;  Laterality: N/A;   ESOPHAGOGASTRODUODENOSCOPY N/A 07/28/2017   Procedure: ESOPHAGOGASTRODUODENOSCOPY (EGD);  Surgeon: Laurence Spates, MD;  [facility];  Service: Endoscopy;  Laterality: N/A;   ESOPHAGOGASTRODUODENOSCOPY N/A 08/19/2021   Procedure: ESOPHAGOGASTRODUODENOSCOPY (EGD);  Surgeon: Arta Silence, MD;  Location: [facility];  Service: Gastroenterology;  Laterality: N/A;   ESOPHAGOGASTRODUODENOSCOPY (EGD) WITH PROPOFOL N/A 08/12/2014   Procedure: ESOPHAGOGASTRODUODENOSCOPY (EGD) WITH PROPOFOL;  Surgeon: Laurence Spates, MD;  [facility];  Service: Endoscopy;  Laterality: N/A;   ESOPHAGOGASTRODUODENOSCOPY (EGD) WITH PROPOFOL N/A 10/29/2016   Procedure: ESOPHAGOGASTRODUODENOSCOPY (EGD) WITH PROPOFOL;  Surgeon: Laurence Spates, MD;  [facility];  Service: Endoscopy;  Laterality: N/A;   ESOPHAGOGASTRODUODENOSCOPY (EGD) WITH PROPOFOL N/A 05/31/2021   Procedure: ESOPHAGOGASTRODUODENOSCOPY (EGD) WITH PROPOFOL;  Surgeon: Wilford Corner, MD;  [facility];  Service: Gastroenterology;  Laterality: N/A;   ESOPHAGOSCOPY N/A 07/23/2021   Procedure: ESOPHAGOSCOPY;  Surgeon: Clarene Essex, MD;  [facility];  Service: Gastroenterology;  Laterality: N/A;   FOREIGN BODY REMOVAL  07/28/2017   Procedure: FOREIGN BODY REMOVAL;  Surgeon: Laurence Spates, MD;  [facility];  Service: Endoscopy;;   FOREIGN BODY REMOVAL  05/31/2021   Procedure: FOREIGN BODY  REMOVAL;  Surgeon: Wilford Corner, MD;  [facility];  Service: Gastroenterology;;   FOREIGN BODY REMOVAL N/A 08/19/2021   Procedure: FOREIGN BODY REMOVAL;  Surgeon: Arta Silence, MD;  [facility];  Service: Gastroenterology;  Laterality: N/A;   gamma knife     3 yrs ago [place] area around trigeminal nerve area"   PROSTATE BIOPSY     x 3   SAVORY DILATION  06/14/2011   Procedure: SAVORY DILATION;  Surgeon: Jeneen Rinks  Angeline Slim., MD;  [facility];  Service: Endoscopy;  Laterality: N/A;  need xray   SAVORY DILATION N/A 08/12/2014   Procedure: SAVORY DILATION;  Surgeon: Laurence Spates, MD;  [facility];  Service: Endoscopy;  Laterality: N/A;   SAVORY DILATION N/A 10/29/2016   Procedure: SAVORY DILATION;  Surgeon: Laurence Spates, MD;  [facility];  Service: Endoscopy;  Laterality: N/A;   TONSILLECTOMY      child   TRIGEMINAL NERVE BALLOON DECOMPRESSION Right    right ear deafness x 4 total   Family History Family History  Problem Relation Age of Onset   Colon cancer Mother    Heart disease Mother    Liver cancer Sister    Alcohol abuse Father     Social History Social History   Tobacco Use   Smoking status: Former    Types: Cigarettes    Quit date: 06/14/1967    Years since quitting: 54.7   Smokeless tobacco: Never  Vaping Use   Vaping Use: Never used  Substance Use Topics   Alcohol use: No    Alcohol/week: 1.0 standard drink of alcohol    Types: 1 Standard drinks or equivalent per week   Drug use: No   Allergies Patient has no known allergies.  Review of Systems Review of Systems  All other systems reviewed and are negative.   Physical Exam Vital Signs  I have reviewed the triage vital signs BP (!) 152/66   Pulse 73   Temp (!) 97 F (36.1 C) (Temporal)   Resp 18   Wt 96.6 kg   SpO2 98%   BMI 28.88 kg/m  Physical Exam Vitals and nursing note reviewed.  Constitutional:      General: He is not in  acute distress.    Appearance: Normal appearance.  HENT:     Mouth/Throat:     Mouth: Mucous membranes are moist.  Eyes:     Conjunctiva/sclera: Conjunctivae normal.  Cardiovascular:     Rate and Rhythm: Normal rate and regular rhythm.  Pulmonary:     Effort: Pulmonary effort is normal. No respiratory distress.     Breath sounds: Normal breath sounds.  Abdominal:     General: Abdomen is flat.     Palpations: Abdomen is soft.     Tenderness: There is no abdominal tenderness.  Musculoskeletal:     Right lower leg: No edema.     Left lower leg: No edema.  Skin:    General: Skin is warm and dry.     Capillary Refill: Capillary refill takes less than 2 seconds.  Neurological:     Mental Status: He is alert and oriented to person, place, and time. Mental status is at baseline.  Psychiatric:        Mood and Affect: Mood normal.        Behavior: Behavior normal.     ED Results and Treatments Labs (all labs ordered are listed, but only abnormal results are displayed) Labs Reviewed - No data to display  Radiology No results found.  Pertinent labs & imaging results that were available during my care of the patient were reviewed by me and considered in my medical decision making (see MDM for details).  Medications Ordered in ED Medications  glucagon (human recombinant) (GLUCAGEN) injection 1 mg (1 mg Intravenous Given 03/05/22 2113)  ondansetron [facility]) injection 4 mg (4 mg Intravenous Given 03/05/22 2112)                                                                                                                                     Procedures Procedures  (including critical care time)  Medical Decision Making / ED Course   MDM:  82 year old male presenting to the emergency department with possible food impaction.  Patient has multiple episodes of  previous food impaction.  He reports this episode is today the same.  Doubt any other acute process.  Discussed with GI provider Dr. Paulita Fujita who recommended trial of glucagon, attempted this without any success.  He will take patient to the endoscopy suite to perform disimpaction of his esophagus.  Extremely low concern for esophageal perforation, patient very well-appearing and reports only mild pain.       Additional history obtained: -Additional history obtained from spouse -External records from outside source obtained and reviewed including: Chart review including previous notes, labs, imaging, consultation notes including previous ER visits for food impaction   Medicines ordered and prescription drug management: Meds ordered this encounter  Medications   glucagon (human recombinant) (GLUCAGEN) injection 1 mg   ondansetron (ZOFRAN) injection 4 mg    -I have reviewed the patients home medicines and have made adjustments as needed   Consultations Obtained: I requested consultation with the gastroenterologist,  and discussed lab and imaging findings as well as pertinent plan - they recommend: proceed to endoscopy   :Social Determinants of Health:  Diagnosis or treatment significantly limited by social determinants of health: former smoker   Reevaluation: After the interventions noted above, I reevaluated the patient and found that they have stayed the same  Co morbidities that complicate the patient evaluation  Past Medical History:  Diagnosis Date   Cancer ([place])    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  Despite best efforts to proofread,  errors can occur which can change the documentation meaning.    Cristie Hem, MD 03/05/22 [phone]

## 2022-03-05 NOTE — Anesthesia Procedure Notes (Signed)
Procedure Name: Intubation Date/Time: 03/05/2022 10:53 PM  Performed by: Montel Clock, CRNAPre-anesthesia Checklist: Patient identified, Emergency Drugs available, Suction available, Patient being monitored and Timeout performed Patient Re-evaluated:Patient Re-evaluated prior to induction Oxygen Delivery Method: Circle system utilized Preoxygenation: Pre-oxygenation with 100% oxygen Induction Type: IV induction and Rapid sequence Laryngoscope Size: Mac and 3 Grade View: Grade I Tube type: Oral Tube size: 7.5 mm Number of attempts: 1 Airway Equipment and Method: Stylet Placement Confirmation: ETT inserted through vocal cords under direct vision, positive ETCO2 and breath sounds checked- equal and bilateral Secured at: 24 cm Tube secured with: Tape Dental Injury: Teeth and Oropharynx as per pre-operative assessment

## 2022-03-05 NOTE — ED Triage Notes (Signed)
Tonight at dinner felt a food bolus of hamburger get "stuck" about 2 hrs ago. He is now experiencing N/V, "knot" sensation, drooling. In triage, speaks in complete sentences and breathing easy.  This has happened to him previously, required endoscopy.   H/o esophageal stricture (<53m). Last stretching procedure 9 mo ago.

## 2022-03-05 NOTE — Anesthesia Preprocedure Evaluation (Signed)
Anesthesia Evaluation  Patient identified by MRN, date of birth, ID band Patient awake    Reviewed: Allergy & Precautions, NPO status , Patient's Chart, lab work & pertinent test results, reviewed documented beta blocker date and time   History of Anesthesia Complications Negative for: history of anesthetic complications  Airway Mallampati: II  TM Distance: >3 FB Neck ROM: Full    Dental  (+) Loose,    Pulmonary former smoker,    Pulmonary exam normal        Cardiovascular hypertension, Pt. on medications and Pt. on home beta blockers + CAD and + Past MI  Normal cardiovascular exam     Neuro/Psych  Headaches, Trigeminal neuralgia negative psych ROS   GI/Hepatic Neg liver ROS, GERD  ,Food impaction   Endo/Other  negative endocrine ROS  Renal/GU negative Renal ROS  negative genitourinary   Musculoskeletal  (+) Arthritis ,   Abdominal   Peds  Hematology negative hematology ROS (+)   Anesthesia Other Findings Day of surgery medications reviewed with patient.  Reproductive/Obstetrics negative OB ROS                             Anesthesia Physical Anesthesia Plan  ASA: 3 and emergent  Anesthesia Plan: General   Post-op Pain Management: Minimal or no pain anticipated   Induction: Intravenous, Rapid sequence and Cricoid pressure planned  PONV Risk Score and Plan: 2 and Treatment may vary due to age or medical condition and Ondansetron  Airway Management Planned: Oral ETT  Additional Equipment:   Intra-op Plan:   Post-operative Plan: Extubation in OR  Informed Consent: I have reviewed the patients History and Physical, chart, labs and discussed the procedure including the risks, benefits and alternatives for the proposed anesthesia with the patient or authorized representative who has indicated his/her understanding and acceptance.     Dental advisory given  Plan Discussed  with: CRNA  Anesthesia Plan Comments:         Anesthesia Quick Evaluation

## 2022-03-06 ENCOUNTER — Encounter (HOSPITAL_COMMUNITY): Payer: Self-pay | Admitting: Gastroenterology

## 2022-03-06 NOTE — Op Note (Signed)
[facility] Patient Name: Billy Knox Procedure Date: 03/05/2022 MRN: [id number] Attending MD: Arta Silence , MD, [id number] Date of Birth: 12/30/1940 CSN: [id number] Age: 82 Admit Type: Outpatient Procedure:                Upper GI endoscopy Indications:              Dysphagia, Foreign body in the esophagus Providers:                Arta Silence, MD, Altamese Cabal, RN, Glori Bickers, RN, Michial Disney Dalton, Technician Referring MD:             Emergency Department Medicines:                General Anesthesia Complications:            No immediate complications. Estimated Blood Loss:     Estimated blood loss: none. Procedure:                Pre-Anesthesia Assessment:                           - Prior to the procedure, a History and Physical                            was performed, and patient medications and                            allergies were reviewed. The patient's tolerance of                            previous anesthesia was also reviewed. The risks                            and benefits of the procedure and the sedation                            options and risks were discussed with the patient.                            All questions were answered, and informed consent                            was obtained. Prior Anticoagulants: The patient has                            taken no anticoagulant or antiplatelet agents                            except for aspirin. ASA Grade Assessment: III - A                            patient with severe systemic disease. After  reviewing the risks and benefits, the patient was                            deemed in satisfactory condition to undergo the                            procedure.                           After obtaining informed consent, the endoscope was                            passed under direct vision. Throughout the                             procedure, the patient's blood pressure, pulse, and                            oxygen saturations were monitored continuously. The                            [id number] Olympus endoscope was introduced                            through the mouth, and advanced to the second part                            of duodenum. The [id number] Olympus slim                            endoscope was introduced through the and advanced                            to the. The upper GI endoscopy was accomplished                            without difficulty. The patient tolerated the                            procedure well. Scope In: Scope Out: Findings:      Food was found in the middle third of the esophagus and in the lower       third of the esophagus. Removal was accomplished with a regular forceps,       talon forceps and Roth net. This was a lengthy process, took about one       hour.      LA Grade B (one or more mucosal breaks greater than 5 mm, not extending       between the tops of two mucosal folds) esophagitis with no bleeding was       found.      One benign-appearing, intrinsic severe (stenosis; an endoscope cannot       pass) stenosis was found. This stenosis measured 5 mm (inner diameter) x       less than one cm (in length). The stenosis was traversed after  downsizing scope to pediatric endoscope (diagnostic endoscope would not       pass)..      The exam of the esophagus was otherwise normal.      A small hiatal hernia was present.      The exam of the stomach was otherwise normal.      The duodenal bulb, first portion of the duodenum and second portion of       the duodenum were normal. Impression:               - Food in the middle third of the esophagus and in                            the lower third of the esophagus. Removal was                            successful.                           - LA Grade B esophagitis with no bleeding.                            - Benign-appearing esophageal stenosis.                           - Small hiatal hernia.                           - Normal duodenal bulb, first portion of the                            duodenum and second portion of the duodenum. Moderate Sedation:      Not Applicable - Patient had care per Anesthesia. Recommendation:           - Return patient to emergency department for                            ongoing care.                           - Discharge patient to home (via wheelchair).                           - Full liquid diet until further notice. Patient                            can NOT have solid foods (no beef, chicken, steak,                            fibrous breads, fibrous/fresh fruits/vegetables)                            until further notice.                           - Continue present medications.                           -  Return to GI clinic at appointment to be                            scheduled.                           - Perform an upper GI endoscopy at appointment to                            be scheduled. Will need esophageal dilatation,                            hospital with availability of fluoroscopy. Might                            also consider work-up for possible achalasia, if                            not already done.                           - Return to referring physician as previously                            scheduled. Procedure Code(s):        --- Professional ---                           [phone], Esophagogastroduodenoscopy, flexible,                            transoral; with removal of foreign body(s) Diagnosis Code(s):        --- Professional ---                           [id number], Food in esophagus causing other injury,                            initial encounter                           K20.90, Esophagitis, unspecified without bleeding                           K22.2, Esophageal obstruction                            K44.9, Diaphragmatic hernia without obstruction or                            gangrene                           R13.10, Dysphagia, unspecified                           T18.108A, Unspecified foreign body in esophagus  causing other injury, initial encounter CPT copyright 2022 [employer]. All rights reserved. The codes documented in this report are preliminary and upon coder review may  be revised to meet current compliance requirements. Arta Silence, MD 03/06/2022 12:09:44 AM This report has been signed electronically. Number of Addenda: 0

## 2022-03-06 NOTE — ED Provider Notes (Signed)
I assumed care of the patient at [place].  The patient was here earlier after trying to eat a hamburger and had a food impaction.  He was taken to the endoscopy suite and they brought him back here for reevaluation.  He tells me that he is feeling much better.  Able to tolerate by mouth.  Will discharge home.  GI follow-up.   Deno Etienne, DO 03/06/22 [phone]

## 2022-03-06 NOTE — Anesthesia Postprocedure Evaluation (Signed)
Anesthesia Post Note  Patient: Billy Knox  Procedure(s) Performed: ESOPHAGOGASTRODUODENOSCOPY (EGD) FOREIGN BODY REMOVAL     Patient location during evaluation: PACU Anesthesia Type: General Level of consciousness: awake and alert Pain management: pain level controlled Vital Signs Assessment: post-procedure vital signs reviewed and stable Respiratory status: spontaneous breathing, nonlabored ventilation and respiratory function stable Cardiovascular status: blood pressure returned to baseline Postop Assessment: no apparent nausea or vomiting Anesthetic complications: no   No notable events documented.  Last Vitals:  Vitals:   03/06/22 0029 03/06/22 0040  BP: (!) 147/72 (!) 144/81  Pulse: 77 76  Resp: 19 18  Temp:    SpO2: 95% 97%    Last Pain:  Vitals:   03/06/22 0047  TempSrc:   PainSc: [place]

## 2022-03-06 NOTE — Transfer of Care (Signed)
Immediate Anesthesia Transfer of Care Note  Patient: Billy Knox  Procedure(s) Performed: ESOPHAGOGASTRODUODENOSCOPY (EGD) FOREIGN BODY REMOVAL  Patient Location: Endoscopy Unit  Anesthesia Type:General  Level of Consciousness: drowsy and patient cooperative  Airway & Oxygen Therapy: Patient Spontanous Breathing and Patient connected to face mask oxygen  Post-op Assessment: Report given to RN and Post -op Vital signs reviewed and stable  Post vital signs: Reviewed and stable  Last Vitals:  Vitals Value Taken Time  BP    Temp    Pulse    Resp    SpO2      Last Pain:  Vitals:   03/05/22 2216  TempSrc: Temporal  PainSc: 4          Complications: No notable events documented.

## 2022-03-06 NOTE — Discharge Instructions (Signed)
Endoscopy Care After Please read the instructions outlined below and refer to this sheet in the next few weeks. These discharge instructions provide you with general information on caring for yourself after you leave the hospital. Your doctor may also give you specific instructions. While your treatment has been planned according to the most current medical practices available, unavoidable complications occasionally occur. If you have any problems or questions after discharge, please call Dr. Paulita Fujita [facility]) at [phone].  HOME CARE INSTRUCTIONS Activity You may resume your regular activity but move at a slower pace for the next 24 hours.  Take frequent rest periods for the next 24 hours.  Walking will help expel (get rid of) the air and reduce the bloated feeling in your abdomen.  No driving for 24 hours (because of the anesthesia (medicine) used during the test).  You may shower.  Do not sign any important legal documents or operate any machinery for 24 hours (because of the anesthesia used during the test).  Nutrition Drink plenty of fluids.  Full liquid diet ONLY (NOTHING solid; no beef, chicken, steak, fish, pork, sausage, fibrous meats/breads, raw/fibrous fruits/vegetables).  Pureed-type or liquid foods ONLY. Avoid alcoholic beverages for 24 hours or as instructed by your caregiver.  Medications You may resume your normal medications unless your caregiver tells you otherwise. What you can expect today You may experience abdominal discomfort such as a feeling of fullness or "gas" pains.  You may experience a sore throat for 2 to 3 days. This is normal. Gargling with salt water may help this.   SEEK IMMEDIATE MEDICAL CARE IF: You have excessive nausea (feeling sick to your stomach) and/or vomiting.  You have severe abdominal pain and distention (swelling).  You have trouble swallowing.  You have a temperature over 100 F (37.8 C).  You have rectal bleeding or  vomiting of blood.  Document Released: 08/15/2003 Document Revised: 09/12/2010 Document Reviewed: 02/25/2007 [facility] Patient Information 2012 [place].

## 2022-03-26 ENCOUNTER — Other Ambulatory Visit: Payer: Self-pay | Admitting: Gastroenterology

## 2022-04-08 DIAGNOSIS — K080 Exfoliation of teeth due to systemic causes: Secondary | ICD-10-CM | POA: Diagnosis not present

## 2022-04-09 ENCOUNTER — Encounter (HOSPITAL_COMMUNITY): Payer: Self-pay | Admitting: Gastroenterology

## 2022-04-16 ENCOUNTER — Ambulatory Visit (HOSPITAL_BASED_OUTPATIENT_CLINIC_OR_DEPARTMENT_OTHER): Payer: Medicare Other | Admitting: Certified Registered Nurse Anesthetist

## 2022-04-16 ENCOUNTER — Other Ambulatory Visit: Payer: Self-pay

## 2022-04-16 ENCOUNTER — Encounter (HOSPITAL_COMMUNITY)
Admission: RE | Disposition: A | Discharge status: Home / Self Care | Payer: Self-pay | Source: Ambulatory Visit | Attending: Gastroenterology

## 2022-04-16 ENCOUNTER — Ambulatory Visit (HOSPITAL_COMMUNITY): Payer: Medicare Other

## 2022-04-16 ENCOUNTER — Ambulatory Visit (HOSPITAL_COMMUNITY)
Admission: RE | Admit: 2022-04-16 | Discharge: 2022-04-16 | Disposition: A | Discharge status: Home / Self Care | Payer: Medicare Other | Source: Ambulatory Visit | Attending: Gastroenterology | Admitting: Gastroenterology

## 2022-04-16 ENCOUNTER — Ambulatory Visit (HOSPITAL_COMMUNITY): Payer: Medicare Other | Admitting: Certified Registered Nurse Anesthetist

## 2022-04-16 DIAGNOSIS — K222 Esophageal obstruction: Secondary | ICD-10-CM

## 2022-04-16 DIAGNOSIS — K219 Gastro-esophageal reflux disease without esophagitis: Secondary | ICD-10-CM | POA: Insufficient documentation

## 2022-04-16 DIAGNOSIS — Z09 Encounter for follow-up examination after completed treatment for conditions other than malignant neoplasm: Secondary | ICD-10-CM | POA: Diagnosis not present

## 2022-04-16 DIAGNOSIS — I252 Old myocardial infarction: Secondary | ICD-10-CM | POA: Diagnosis not present

## 2022-04-16 DIAGNOSIS — Z79899 Other long term (current) drug therapy: Secondary | ICD-10-CM | POA: Insufficient documentation

## 2022-04-16 DIAGNOSIS — I2510 Atherosclerotic heart disease of native coronary artery without angina pectoris: Secondary | ICD-10-CM | POA: Insufficient documentation

## 2022-04-16 DIAGNOSIS — I10 Essential (primary) hypertension: Secondary | ICD-10-CM

## 2022-04-16 DIAGNOSIS — K2900 Acute gastritis without bleeding: Secondary | ICD-10-CM

## 2022-04-16 DIAGNOSIS — Z87891 Personal history of nicotine dependence: Secondary | ICD-10-CM

## 2022-04-16 DIAGNOSIS — R1310 Dysphagia, unspecified: Secondary | ICD-10-CM | POA: Insufficient documentation

## 2022-04-16 HISTORY — PX: ESOPHAGOGASTRODUODENOSCOPY (EGD) WITH PROPOFOL: SHX5813

## 2022-04-16 HISTORY — PX: BALLOON DILATION: SHX5330

## 2022-04-16 SURGERY — ESOPHAGOGASTRODUODENOSCOPY (EGD) WITH PROPOFOL
Anesthesia: Monitor Anesthesia Care

## 2022-04-16 MED ORDER — PROPOFOL 10 MG/ML IV BOLUS
INTRAVENOUS | Status: DC | PRN
  Administered 2022-04-16: 20 mg via INTRAVENOUS
  Administered 2022-04-16: 50 mg via INTRAVENOUS

## 2022-04-16 MED ORDER — LIDOCAINE 2% (20 MG/ML) 5 ML SYRINGE
INTRAMUSCULAR | Status: DC | PRN
  Administered 2022-04-16: 140 mg via INTRAVENOUS

## 2022-04-16 MED ORDER — PROPOFOL 1000 MG/100ML IV EMUL
INTRAVENOUS | Status: AC
  Filled 2022-04-16: qty 100

## 2022-04-16 MED ORDER — LACTATED RINGERS IV SOLN: INTRAVENOUS | Status: DC

## 2022-04-16 MED ORDER — PROPOFOL 500 MG/50ML IV EMUL
INTRAVENOUS | Status: DC | PRN
  Administered 2022-04-16: 150 ug/kg/min via INTRAVENOUS

## 2022-04-16 MED ORDER — SODIUM CHLORIDE 0.9 % IV SOLN: INTRAVENOUS | Status: DC

## 2022-04-16 SURGICAL SUPPLY — 15 items

## 2022-04-16 NOTE — Discharge Instructions (Addendum)
YOU HAD AN ENDOSCOPIC PROCEDURE TODAY: Refer to the procedure report and other information in the discharge instructions given to you for any specific questions about what was found during the examination. If this information does not answer your questions, please call [facility] office at [phone] to clarify.   YOU SHOULD EXPECT: Some feelings of bloating in the abdomen. Passage of more gas than usual. Walking can help get rid of the air that was put into your GI tract during the procedure and reduce the bloating. If you had a lower endoscopy (such as a colonoscopy or flexible sigmoidoscopy) you may notice spotting of blood in your stool or on the toilet paper. Some abdominal soreness may be present for a day or two, also.  DIET: Your first meal following the procedure should be a light meal and then it is ok to progress to your normal diet. A half-sandwich or bowl of soup is an example of a good first meal. Heavy or fried foods are harder to digest and may make you feel nauseous or bloated. Drink plenty of fluids but you should avoid alcoholic beverages for 24 hours. If you had a esophageal dilation, please see attached instructions for diet.    ACTIVITY: Your care partner should take you home directly after the procedure. You should plan to take it easy, moving slowly for the rest of the day. You can resume normal activity the day after the procedure however YOU SHOULD NOT DRIVE, use power tools, machinery or perform tasks that involve climbing or major physical exertion for 24 hours (because of the sedation medicines used during the test).   SYMPTOMS TO REPORT IMMEDIATELY: A gastroenterologist can be reached at any hour. Please call [phone]  for any of the following symptoms:  Following lower endoscopy (colonoscopy, flexible sigmoidoscopy) Excessive amounts of blood in the stool  Significant tenderness, worsening of abdominal pains  Swelling of the abdomen that is new, acute  Fever of 100  or higher  Following upper endoscopy (EGD, EUS, ERCP, esophageal dilation) Vomiting of blood or coffee ground material  New, significant abdominal pain  New, significant chest pain or pain under the shoulder blades  Painful or persistently difficult swallowing  New shortness of breath  Black, tarry-looking or red, bloody stools  FOLLOW UP:  If any biopsies were taken you will be contacted by phone or by letter within the next 1-3 weeks. Call [phone]  if you have not heard about the biopsies in 3 weeks.  Please also call with any specific questions about appointments or follow up tests. YOU HAD AN ENDOSCOPIC PROCEDURE TODAY: Refer to the procedure report and other information in the discharge instructions given to you for any specific questions about what was found during the examination. If this information does not answer your questions, please call [facility] office at [phone] to clarify.   YOU SHOULD EXPECT: Some feelings of bloating in the abdomen. Passage of more gas than usual. Walking can help get rid of the air that was put into your GI tract during the procedure and reduce the bloating. If you had a lower endoscopy (such as a colonoscopy or flexible sigmoidoscopy) you may notice spotting of blood in your stool or on the toilet paper. Some abdominal soreness may be present for a day or two, also.  DIET: Pureed diet for seven days and progress to a soft diet as tolerated.  ACTIVITY: Your care partner should take you home directly after the procedure. You should plan to  take it easy, moving slowly for the rest of the day. You can resume normal activity the day after the procedure however YOU SHOULD NOT DRIVE, use power tools, machinery or perform tasks that involve climbing or major physical exertion for 24 hours (because of the sedation medicines used during the test).   SYMPTOMS TO REPORT IMMEDIATELY: A gastroenterologist can be reached at any hour. Please call [phone]  for  any of the following symptoms:  Following lower endoscopy (colonoscopy, flexible sigmoidoscopy) Excessive amounts of blood in the stool  Significant tenderness, worsening of abdominal pains  Swelling of the abdomen that is new, acute  Fever of 100 or higher  Following upper endoscopy (EGD, EUS, ERCP, esophageal dilation) Vomiting of blood or coffee ground material  New, significant abdominal pain  New, significant chest pain or pain under the shoulder blades  Painful or persistently difficult swallowing  New shortness of breath  Black, tarry-looking or red, bloody stools  FOLLOW UP:  If any biopsies were taken you will be contacted by phone or by letter within the next 1-3 weeks. Call [phone]  if you have not heard about the biopsies in 3 weeks.  Please also call with any specific questions about appointments or follow up tests.  CONTINUE PUREED DIET FOR THE NEXT 7 DAYS and then eat small amounts of soft food as tolerated. If soft foods not tolerated, then go back on a pureed diet.

## 2022-04-16 NOTE — H&P (Signed)
Date of Initial H&P: 04/09/22  History reviewed, patient examined, no change in status, stable for surgery.

## 2022-04-16 NOTE — Anesthesia Preprocedure Evaluation (Addendum)
Anesthesia Evaluation  Patient identified by MRN, date of birth, ID band Patient awake    Reviewed: Allergy & Precautions, NPO status , Patient's Chart, lab work & pertinent test results  History of Anesthesia Complications Negative for: history of anesthetic complications  Airway Mallampati: III  TM Distance: >3 FB Neck ROM: Full    Dental  (+) Loose, Implants, Dental Advisory Given   Pulmonary former smoker   breath sounds clear to auscultation       Cardiovascular hypertension, Pt. on medications and Pt. on home beta blockers (-) angina + CAD (CAD (CTO of RCA) and + Past MI   Rhythm:Regular Rate:Normal  '23 ECHO: EF 55-60%, normal LVF, Grade 1 DD, normal RVF, trivial MR, mild AI   Neuro/Psych  Headaches Trigeminal neuralgia    GI/Hepatic Neg liver ROS,GERD  Medicated and Controlled,,  Endo/Other  negative endocrine ROS    Renal/GU negative Renal ROS     Musculoskeletal   Abdominal   Peds  Hematology negative hematology ROS (+)   Anesthesia Other Findings   Reproductive/Obstetrics                             Anesthesia Physical Anesthesia Plan  ASA: 3  Anesthesia Plan: MAC   Post-op Pain Management: Minimal or no pain anticipated   Induction:   PONV Risk Score and Plan: 1 and Treatment may vary due to age or medical condition  Airway Management Planned: Natural Airway and Nasal Cannula  Additional Equipment: None  Intra-op Plan:   Post-operative Plan:   Informed Consent: I have reviewed the patients History and Physical, chart, labs and discussed the procedure including the risks, benefits and alternatives for the proposed anesthesia with the patient or authorized representative who has indicated his/her understanding and acceptance.     Dental advisory given  Plan Discussed with: CRNA and Surgeon  Anesthesia Plan Comments:         Anesthesia Quick  Evaluation

## 2022-04-16 NOTE — Transfer of Care (Signed)
Immediate Anesthesia Transfer of Care Note  Patient: Billy Knox  Procedure(s) Performed: Procedure(s): ESOPHAGOGASTRODUODENOSCOPY (EGD) WITH PROPOFOL (N/A) BALLOON DILATION (N/A)  Patient Location: PACU  Anesthesia Type:MAC  Level of Consciousness: Patient easily awoken, sedated, comfortable, cooperative, following commands, responds to stimulation.   Airway & Oxygen Therapy: Patient spontaneously breathing, ventilating well, oxygen via simple oxygen mask.  Post-op Assessment: Report given to PACU RN, vital signs reviewed and stable, moving all extremities.   Post vital signs: Reviewed and stable.  Complications: No apparent anesthesia complications  Last Vitals:  Vitals Value Taken Time  BP 85/46 1326  Temp    Pulse 68 1326  Resp 11 1326  SpO2 100 1326    Last Pain:  Vitals:   04/16/22 1142  TempSrc: Tympanic  PainSc: 0-No pain         Complications: No notable events documented.

## 2022-04-16 NOTE — Interval H&P Note (Signed)
History and Physical Interval Note:  04/16/2022 12:50 PM  Billy Knox  has presented today for surgery, with the diagnosis of Dysphagia/Stricture.  The various methods of treatment have been discussed with the patient and family. After consideration of risks, benefits and other options for treatment, the patient has consented to  Procedure(s) with comments: ESOPHAGOGASTRODUODENOSCOPY (EGD) WITH PROPOFOL (N/A) SAVORY DILATION (N/A) - Savory vs balloon-NEEDS FLUORO as a surgical intervention.  The patient's history has been reviewed, patient examined, no change in status, stable for surgery.  I have reviewed the patient's chart and labs.  Questions were answered to the patient's satisfaction.     Lear Ng

## 2022-04-16 NOTE — Anesthesia Postprocedure Evaluation (Signed)
Anesthesia Post Note  Patient: ACY OBARA  Procedure(s) Performed: ESOPHAGOGASTRODUODENOSCOPY (EGD) WITH PROPOFOL BALLOON DILATION     Patient location during evaluation: Endoscopy Anesthesia Type: MAC Level of consciousness: awake and alert, patient cooperative and oriented Pain management: pain level controlled Vital Signs Assessment: post-procedure vital signs reviewed and stable Respiratory status: spontaneous breathing, nonlabored ventilation and respiratory function stable Cardiovascular status: stable and blood pressure returned to baseline Postop Assessment: no apparent nausea or vomiting Anesthetic complications: no   No notable events documented.  Last Vitals:  Vitals:   04/16/22 1340 04/16/22 1350  BP: 127/73 (!) 143/85  Pulse: 69 66  Resp: 14 14  Temp:    SpO2: 97% 99%    Last Pain:  Vitals:   04/16/22 1350  TempSrc:   PainSc: 0-No pain                 Rollin Kotowski,E. Anne-Marie Genson

## 2022-04-16 NOTE — Anesthesia Procedure Notes (Signed)
Procedure Name: MAC Date/Time: 04/16/2022 12:51 PM  Performed by: Deliah Boston, CRNAPre-anesthesia Checklist: Patient identified, Emergency Drugs available, Suction available and Patient being monitored Patient Re-evaluated:Patient Re-evaluated prior to induction Oxygen Delivery Method: Simple face mask Preoxygenation: Pre-oxygenation with 100% oxygen Placement Confirmation: positive ETCO2 and breath sounds checked- equal and bilateral Dental Injury: Teeth and Oropharynx as per pre-operative assessment

## 2022-04-16 NOTE — Op Note (Signed)
[facility] Patient Name: Billy Knox Procedure Date: 04/16/2022 MRN: [id number] Attending MD: Lear Ng , MD, [id number] Date of Birth: 10/31/1940 CSN: [id number] Age: 82 Admit Type: Outpatient Procedure:                Upper GI endoscopy Indications:              Dysphagia, Stenosis of the esophagus Providers:                Lear Ng, MD, Vladimir Crofts, RN, Luan Moore, Technician, Heide Scales, CRNA Referring MD:             Kristen Loader Medicines:                Propofol per Anesthesia, Monitored Anesthesia Care Complications:            No immediate complications. Estimated Blood Loss:     Estimated blood loss was minimal. Procedure:                Pre-Anesthesia Assessment:                           - Prior to the procedure, a History and Physical                            was performed, and patient medications and                            allergies were reviewed. The patient's tolerance of                            previous anesthesia was also reviewed. The risks                            and benefits of the procedure and the sedation                            options and risks were discussed with the patient.                            All questions were answered, and informed consent                            was obtained. Prior Anticoagulants: The patient has                            taken no anticoagulant or antiplatelet agents. ASA                            Grade Assessment: III - A patient with severe                            systemic disease. After reviewing the risks and  benefits, the patient was deemed in satisfactory                            condition to undergo the procedure.                           After obtaining informed consent, the endoscope was                            passed under direct vision. Throughout the                            procedure,  the patient's blood pressure, pulse, and                            oxygen saturations were monitored continuously. The                            [id number] Olympus endoscope was introduced                            through the mouth, and advanced to the second part                            of duodenum. The upper GI endoscopy was                            accomplished without difficulty. The patient                            tolerated the procedure well. Scope In: Scope Out: Findings:      One benign-appearing, intrinsic moderate (circumferential scarring or       stenosis; an endoscope may pass) stenosis was found in the distal       esophagus. This stenosis measured 1 cm (inner diameter). The stenosis       was traversed. A TTS dilator was passed through the scope. Dilation with       a 10-25-10 mm balloon and a 12-13.5-15 mm balloon dilator was performed       to 15 mm. The dilation site was examined and showed moderate improvement       in luminal narrowing. Estimated blood loss was minimal.      Segmental moderate inflammation characterized by congestion (edema) and       erythema was found in the gastric antrum.      The cardia and gastric fundus were normal on retroflexion.      The examined duodenum was normal. Impression:               - Benign-appearing esophageal stenosis. Dilated.                           - Acute gastritis.                           - Normal examined duodenum.                           -  No specimens collected. Moderate Sedation:      N/A - MAC procedure Recommendation:           - Patient has a contact number available for                            emergencies. The signs and symptoms of potential                            delayed complications were discussed with the                            patient. Return to normal activities tomorrow.                            Written discharge instructions were provided to the                             patient.                           - Pureed diet.                           - If dysphagia persists, then likely a motility                            source rather than an obstructive process. Procedure Code(s):        --- Professional ---                           [phone], Esophagogastroduodenoscopy, flexible,                            transoral; with transendoscopic balloon dilation of                            esophagus (less than 30 mm diameter) Diagnosis Code(s):        --- Professional ---                           R13.10, Dysphagia, unspecified                           K29.00, Acute gastritis without bleeding                           K22.2, Esophageal obstruction CPT copyright 2022 American Medical Association. All rights reserved. The codes documented in this report are preliminary and upon coder review may  be revised to meet current compliance requirements. Lear Ng, MD 04/16/2022 1:36:08 PM This report has been signed electronically. Number of Addenda: 0

## 2022-04-18 ENCOUNTER — Encounter (HOSPITAL_COMMUNITY): Payer: Self-pay | Admitting: Gastroenterology

## 2022-04-25 DIAGNOSIS — L84 Corns and callosities: Secondary | ICD-10-CM | POA: Diagnosis not present

## 2022-04-25 DIAGNOSIS — I739 Peripheral vascular disease, unspecified: Secondary | ICD-10-CM | POA: Diagnosis not present

## 2022-05-23 DIAGNOSIS — K080 Exfoliation of teeth due to systemic causes: Secondary | ICD-10-CM | POA: Diagnosis not present

## 2022-06-25 DIAGNOSIS — K219 Gastro-esophageal reflux disease without esophagitis: Secondary | ICD-10-CM | POA: Diagnosis not present

## 2022-06-25 DIAGNOSIS — R7303 Prediabetes: Secondary | ICD-10-CM | POA: Diagnosis not present

## 2022-06-25 DIAGNOSIS — I25119 Atherosclerotic heart disease of native coronary artery with unspecified angina pectoris: Secondary | ICD-10-CM | POA: Diagnosis not present

## 2022-06-25 DIAGNOSIS — Z13220 Encounter for screening for lipoid disorders: Secondary | ICD-10-CM | POA: Diagnosis not present

## 2022-06-25 DIAGNOSIS — Z0000 Encounter for general adult medical examination without abnormal findings: Secondary | ICD-10-CM | POA: Diagnosis not present

## 2022-06-25 DIAGNOSIS — I10 Essential (primary) hypertension: Secondary | ICD-10-CM | POA: Diagnosis not present

## 2022-07-09 DIAGNOSIS — H353124 Nonexudative age-related macular degeneration, left eye, advanced atrophic with subfoveal involvement: Secondary | ICD-10-CM | POA: Diagnosis not present

## 2022-07-09 DIAGNOSIS — H43813 Vitreous degeneration, bilateral: Secondary | ICD-10-CM | POA: Diagnosis not present

## 2022-07-09 DIAGNOSIS — H35373 Puckering of macula, bilateral: Secondary | ICD-10-CM | POA: Diagnosis not present

## 2022-07-09 DIAGNOSIS — H353113 Nonexudative age-related macular degeneration, right eye, advanced atrophic without subfoveal involvement: Secondary | ICD-10-CM | POA: Diagnosis not present

## 2022-07-24 ENCOUNTER — Other Ambulatory Visit: Payer: Self-pay | Admitting: Physician Assistant

## 2022-07-25 DIAGNOSIS — L603 Nail dystrophy: Secondary | ICD-10-CM | POA: Diagnosis not present

## 2022-07-25 DIAGNOSIS — L84 Corns and callosities: Secondary | ICD-10-CM | POA: Diagnosis not present

## 2022-07-25 DIAGNOSIS — I739 Peripheral vascular disease, unspecified: Secondary | ICD-10-CM | POA: Diagnosis not present

## 2022-08-21 ENCOUNTER — Other Ambulatory Visit: Payer: Self-pay | Admitting: Interventional Cardiology

## 2022-08-22 DIAGNOSIS — K080 Exfoliation of teeth due to systemic causes: Secondary | ICD-10-CM | POA: Diagnosis not present

## 2022-08-26 NOTE — Progress Notes (Deleted)
No chief complaint on file.   HISTORY OF PRESENT ILLNESS:  08/26/22 ALL:  Billy Knox returns for follow up for TN. He continues gabapentin 400mg  QID.   08/16/2021 ALL: Billy Knox returns for follow up for trigeminal neuralgia. He continues gabapentin 400mg  QID. He has to be very consistent with scheduling or pain is unbearable. He feels most difficulty time is from bedtime dose to morning. He feels he is doing fairly well at this time.   08/14/2020 ALL: Billy Knox is a 82 y.o. male here today for follow up for trigeminal neuralgia. He was switched to Gralise, however, he did not feel it worked as well as gabapentin. He continues gabapentin 400mg  QID. He has weaned lamotrigine . He has not taken this medication in over a year and feels he has been doing well. If he misses a dose of gabapentin, pain becomes unbearable in about 2 hours. He has an alarm for 7am, 12pm, 5pm and 10pm. He has more pain in the morning and can tell its time for his medication. He does not wish to adjust dose due to feeling groggy. He has intermittent burning of the right occipital region. He see a chiropractor who uses acupuncture and pressure point treatment that helps. He is followed by ophthalmology for macular degeneration.    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  In the past he had done well with 300 mg gabapentin 4-5 times a day but if he misses a dose or is delayed the pain comes back, he not only has trigeminal neuralgia but he also has neuropathic pain from surgery.  We have tried multiple medications, gabapentin, Lamictal, at this time we will try to increase his gabapentin but he has been having a hard time tolerating it not sure he can tolerate  higher IR, we will also try to get gabapentin extended release.   Interval history 10/02/2017 AA: He is here for follow up of Trigeminal Neuralgia. Extensive history of this including surgeries and failure of multiple meds including botox. He is on Gabapentin and Lamictal. He says he feels worse with increased area of pain on the right in the trigeminal area, and in the incision site. If he is having a bad day he takes Gabapentin. Still having ififculty touching face, especially shaving. He has lost more taste.  He also feels he has lost smell and taste. He denies any tremors of the hands or falls. He has a slight head tremor that he notices nothing significant. He walking well, no falls. Discussed he is not parkinsonian on exam. Otherwise for TGN, the medicine is working, if he misses the pain gets worse. Feels pain is "under control" currently. Discussed increased Gabapentin can take it every 3 hours as needed, may increase Lamictal as well. Discussed. He has ben to [facility] and multiple other institutions. Injecting alcohol into the nerve helped once.   Interval history 09/24/2016 AA: botulinum toxin in the right trigeminal area did not help. He has continued pain in the are of the scar and now getting more pain in the occipital area, sharp, burning since the surgery suspect injury to the occipital nerve. He takes a 300mg  gabapentin every 5.5 hours which helps. He has not tried Lamictal, baclofen, topiramate, valproate. Will try  Lamictal   HPI:  Billy Knox is a 82 y.o. male here as a referral from Dr. Clarene [facility] for trigeminal neuralgia. 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 He is status post gamma knife radiosurgical rhizotomy in 2012 and recent dorsal root entry zone surgery. He has had 4 surgeries as far back as the 90s. Started 25 years ago first surgery was in 1992. He  has seen in December at [facility] correct and his neurontin was increased and he had a fall. He lost feeling in his left lip after recent fall (see CT below). Yesterday he had pain like a hot poker through the scalp (Right high parietal lobe) and now pain around the right side of the ear. Since the DREZ he still gets flashes of pain in the right side of the face. Feels like someone has hit him all the time. Throbbing and pounding. With light and sound sensitivity and nausea. No medication overuse. He says the trigeminal nerve pain shoots into all three zones (v1,v2,v3). When he first got the trigeminal neuralgia food would trigger it. He has also been to the head of Neurology at [facility] as well as [facility]. Unknown triggers, sporadic, sometimes touching his face with cause the pain, turning the head, wind and chewing. Lightning, severe, brief. He has an aching pain on the right which is constant. Like a pressure. He has daily headaches and half of them a month are migrainous (unilateral right side of head, light, sound sensitivity, pounding, throbbing, nausea) ongoing at this frequency for years. No aura. No medication overuse. He also has neck pain and stiff muscles on the right with decreased ROM, slowly progressive, cannot use muscle relaxers due to side effects and sedation and risk of falls, has tried massage and PT without help, pain and decreased ROM.   Meds tried include: Oxcarbazepine, Vimpat, gabapentin, clonazepam, tiagabine, oxycodone, flexeril, tramadol, naltrexone, Tegretol (did not help).   Reviewed notes, labs and imaging from outside physicians, which showed:   CBC showed elevated white blood cells 3 weeks ago 11.8 with neutrophilic predominance otherwise normal. CMP showed normal sodium, chloride 100, glucose 108, total protein 6 otherwise unremarkable.   Personally reviewed images CT of the head and agree with the following:   IMPRESSION: CT head 02/26/2016 (in ED for a fall):  Atrophy with supratentorial small vessel disease. No intracranial mass hemorrhage, or extra-axial fluid collection. No acute infarct evident. Previous surgical removal a portion of the lateral right occipital bone. Areas of arterial vascular calcification noted.   Primary diagnosis patient is suffering from trigeminal neuralgia for several decades. He was operated with microvascular decompression several times also treated with gamma knife without much effect. He recently stopped oxycodone. He was recently treated with DREZ by Dr. Claudette Laws and is complaining about postprocedural resurgence of neuralgic pain in V2 and also retro-orbital pain always on the right side. Diagnosed with postprocedural flare up. They added low-dose naltrexone increased gabapentin and also added Vimpat. Repeating his neuro imaging was suggested.   REVIEW OF SYSTEMS: Out of a complete 14 system review of symptoms, the patient complains only of the following symptoms,difficulty with vision, neuropathic pain and all other reviewed systems are negative.   ALLERGIES: No Known Allergies   HOME MEDICATIONS: Outpatient Medications Prior to Visit  Medication Sig Dispense Refill   acetaminophen (TYLENOL) 500 MG tablet Take 500-1,000 mg by mouth every 6 (six) hours as needed for  mild pain or headache.     aspirin EC 81 MG tablet Take 81 mg by mouth in the morning.     atorvastatin (LIPITOR) 80 MG tablet Take 1 tablet by mouth once daily 15 tablet 0   finasteride (PROSCAR) 5 MG tablet Take 5 mg by mouth in the morning.     gabapentin (NEURONTIN) 400 MG capsule TAKE 1 CAPSULE BY MOUTH 4 TIMES DAILY (EVERY 5 HOURS) (Patient taking differently: Take 400 mg by mouth in the morning, at noon, in the evening, and at bedtime. (0700, 1200, 1700 & 2200)) 360 capsule 3   Glucosamine HCl (GLUCOSAMINE PO) Take 1,000 mg by mouth in the morning.     LINZESS 145 MCG CAPS capsule Take 145 mcg by mouth every morning.     lisinopril (ZESTRIL) 10 MG  tablet Take 1 tablet (10 mg total) by mouth daily. 15 tablet 0   metoprolol succinate (TOPROL-XL) 50 MG 24 hr tablet Take 1 tablet (50 mg total) by mouth daily. 90 tablet 3   Multiple Vitamin (MULTIVITAMIN WITH MINERALS) TABS tablet Take 1 tablet by mouth in the morning.     nitroGLYCERIN (NITROSTAT) 0.4 MG SL tablet Place 1 tablet (0.4 mg total) under the tongue every 5 (five) minutes as needed for chest pain. 25 tablet 6   Omega-3 Fatty Acids (FISH OIL) 1200 MG CAPS Take 1,200 mg by mouth in the morning.     PRILOSEC OTC 20 MG tablet Take 20 mg by mouth at bedtime.     RESTASIS 0.05 % ophthalmic emulsion Place 1 drop into both eyes at bedtime.     tamsulosin (FLOMAX) 0.4 MG CAPS capsule Take 0.4 mg by mouth every evening.  11   No facility-administered medications prior to visit.     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   Sudden blockage of esophagus 07/2017   Trigeminal neuralgia      PAST SURGICAL HISTORY: Past Surgical History:  Procedure Laterality Date   BALLOON DILATION N/A 04/16/2022   Procedure: BALLOON DILATION;  Surgeon: Charlott Rakes, MD;  Location: [facility];  Service: Gastroenterology;  Laterality: N/A;   CYSTOSCOPY     ESOPHAGOGASTRODUODENOSCOPY     ESOPHAGOGASTRODUODENOSCOPY  06/14/2011   Procedure: ESOPHAGOGASTRODUODENOSCOPY (EGD);  Surgeon: Vertell Novak., MD;  Location: [facility];  Service: Endoscopy;   Laterality: N/A;   ESOPHAGOGASTRODUODENOSCOPY N/A 07/28/2017   Procedure: ESOPHAGOGASTRODUODENOSCOPY (EGD);  Surgeon: Carman Ching, MD;  Location: [facility];  Service: Endoscopy;  Laterality: N/A;   ESOPHAGOGASTRODUODENOSCOPY N/A 08/19/2021   Procedure: ESOPHAGOGASTRODUODENOSCOPY (EGD);  Surgeon: Willis Modena, MD;  Location: [facility];  Service: Gastroenterology;  Laterality: N/A;   ESOPHAGOGASTRODUODENOSCOPY N/A 03/05/2022   Procedure: ESOPHAGOGASTRODUODENOSCOPY (EGD);  Surgeon: Willis Modena, MD;  Location: [facility];  Service: Gastroenterology;  Laterality: N/A;   ESOPHAGOGASTRODUODENOSCOPY (EGD)  WITH PROPOFOL N/A 08/12/2014   Procedure: ESOPHAGOGASTRODUODENOSCOPY (EGD) WITH PROPOFOL;  Surgeon: Carman Ching, MD;  Location: [facility];  Service: Endoscopy;  Laterality: N/A;   ESOPHAGOGASTRODUODENOSCOPY (EGD) WITH PROPOFOL N/A 10/29/2016   Procedure: ESOPHAGOGASTRODUODENOSCOPY (EGD) WITH PROPOFOL;  Surgeon: Carman Ching, MD;  Location: [facility];  Service: Endoscopy;  Laterality: N/A;   ESOPHAGOGASTRODUODENOSCOPY (EGD) WITH PROPOFOL N/A 05/31/2021   Procedure: ESOPHAGOGASTRODUODENOSCOPY (EGD) WITH PROPOFOL;  Surgeon: Charlott Rakes, MD;  Location: [facility];  Service: Gastroenterology;  Laterality: N/A;   ESOPHAGOGASTRODUODENOSCOPY (EGD) WITH PROPOFOL N/A 04/16/2022   Procedure: ESOPHAGOGASTRODUODENOSCOPY (EGD) WITH PROPOFOL;  Surgeon: Charlott Rakes, MD;  Location: [facility];  Service: Gastroenterology;  Laterality: N/A;   ESOPHAGOSCOPY N/A 07/23/2021   Procedure: ESOPHAGOSCOPY;  Surgeon: Vida Rigger, MD;  Location: [facility];  Service: Gastroenterology;  Laterality: N/A;   FOREIGN BODY REMOVAL  07/28/2017   Procedure: FOREIGN BODY REMOVAL;  Surgeon: Carman Ching, MD;  Location: [facility];  Service: Endoscopy;;   FOREIGN BODY REMOVAL  05/31/2021   Procedure: FOREIGN BODY REMOVAL;  Surgeon: Charlott Rakes, MD;  Location: [facility];  Service:  Gastroenterology;;   FOREIGN BODY REMOVAL N/A 08/19/2021   Procedure: FOREIGN BODY REMOVAL;  Surgeon: Willis Modena, MD;  Location: [facility];  Service: Gastroenterology;  Laterality: N/A;   FOREIGN BODY REMOVAL  03/05/2022   Procedure: FOREIGN BODY REMOVAL;  Surgeon: Willis Modena, MD;  Location: [facility];  Service: Gastroenterology;;   gamma knife     3 yrs ago [facility]"brain area around trigeminal nerve area"   PROSTATE BIOPSY     x 3   SAVORY DILATION  06/14/2011   Procedure: SAVORY DILATION;  Surgeon: Vertell Novak., MD;  Location: [facility];  Service: Endoscopy;  Laterality: N/A;  need xray   SAVORY DILATION N/A 08/12/2014   Procedure: SAVORY DILATION;  Surgeon: Carman Ching, MD;  Location: [facility];  Service: Endoscopy;  Laterality: N/A;   SAVORY DILATION N/A 10/29/2016   Procedure: SAVORY DILATION;  Surgeon: Carman Ching, MD;  Location: [facility];  Service: Endoscopy;  Laterality: N/A;   TONSILLECTOMY      child   TRIGEMINAL NERVE BALLOON DECOMPRESSION Right    right ear deafness x 4 total     FAMILY HISTORY: Family History  Problem Relation Age of Onset   Colon cancer Mother    Heart disease Mother    Liver cancer Sister    Alcohol abuse Father      SOCIAL HISTORY: Social History   Socioeconomic History   Marital status: Married    Spouse name: Not on file   Number of children: 1   Years of education: 12   Highest education level: Not on file  Occupational History   Occupation: Retired  Tobacco Use   Smoking status: Former    Current packs/day: 0.00    Types: Cigarettes    Quit date: 06/14/1967    Years since quitting: 55.2   Smokeless tobacco: Never  Vaping Use   Vaping status: Never Used  Substance and Sexual Activity   Alcohol use: No    Alcohol/week: 1.0 standard drink of alcohol    Types: 1 Standard drinks or equivalent per week   Drug use: No   Sexual activity: Not on file  Other Topics Concern   Not on file  Social  History Narrative   Lives at home w/ his wife   Right-handed   Caffeine: 2 cups per day   Social Determinants of Health   Financial Resource Strain:  Not on file  Food Insecurity: Not on file  Transportation Needs: Not on file  Physical Activity: Not on file  Stress: Not on file  Social Connections: Not on file  Intimate Partner Violence: Not on file     PHYSICAL EXAM  There were no vitals filed for this visit.   There is no height or weight on file to calculate BMI.   Generalized: Well developed, in no acute distress  Cardiology: normal rate and rhythm, no murmur auscultated  Respiratory: clear to auscultation bilaterally    Neurological examination  Mentation: Alert oriented to time, place, history taking. Follows all commands speech and language fluent Cranial nerve II-XII: Pupils were equal round reactive to light. Extraocular movements were full, visual field were full on confrontational test. Facial sensation and strength were normal. Head turning and shoulder shrug  were normal and symmetric. Motor: The motor testing reveals 5 over 5 strength of all 4 extremities. Good symmetric motor tone is noted throughout.  Gait and station: Gait is normal.    DIAGNOSTIC DATA (LABS, IMAGING, TESTING) - I reviewed patient records, labs, notes, testing and imaging myself where available.  Lab Results  Component Value Date   WBC 8.2 05/07/2021   HGB 14.4 05/07/2021   HCT 42.8 05/07/2021   MCV 93 05/07/2021   PLT 188 05/07/2021      Component Value Date/Time   NA 141 05/22/2021 1022   K 4.2 05/22/2021 1022   CL 103 05/22/2021 1022   CO2 27 05/22/2021 1022   GLUCOSE 101 (H) 05/22/2021 1022   GLUCOSE 108 (H) 02/26/2016 1112   BUN 13 05/22/2021 1022   CREATININE 0.86 05/22/2021 1022   CALCIUM 8.8 05/22/2021 1022   PROT 5.8 (L) 05/22/2021 1022   ALBUMIN 4.0 05/22/2021 1022   AST 19 05/22/2021 1022   ALT 24 05/22/2021 1022   ALKPHOS 56 05/22/2021 1022   BILITOT 0.7  05/22/2021 1022   GFRNONAA >60 02/26/2016 1112   GFRAA >60 02/26/2016 1112   Lab Results  Component Value Date   CHOL 127 03/19/2017   HDL 34 (L) 03/19/2017   LDLCALC 74 03/19/2017   TRIG 97 03/19/2017   CHOLHDL 3.7 03/19/2017   No results found for: "HGBA1C" No results found for: "VITAMINB12" Lab Results  Component Value Date   TSH 4.050 05/07/2021        No data to display               No data to display           ASSESSMENT AND PLAN  82 y.o. year old male  has a past medical history of Cancer (HCC), Colon polyp, Coronary artery disease, Deafness in right ear, Esophageal stricture, GERD (gastroesophageal reflux disease), Hyperchloremia, Hypertension, Macular degeneration, Myocardial infarction (HCC) (2007), Ringing in ears, Rt facial numbness, Sacroiliac inflammation (HCC), Sudden blockage of esophagus (07/2017), and Trigeminal neuralgia. here with    No diagnosis found.  O[place]achi is doing well, today. He feels trigeminal and occipital pain are well managed on gabapentin 400mg  QID. We will continue current treatment plan. We discussed adding 400mg  at 3am if morning pain become unbearable. He is not interested in adding other meds or referral at this time. He will continue close follow up with care team. Healthy lifestyle habits encouraged. He will follow up in 1 year, sooner if needed.   No orders of the defined types were placed in this encounter.     No orders of the defined types  were placed in this encounter.      Shawnie Dapper, MSN, FNP-C 08/26/2022, 1:58 PM  [facility] [address] [place], [place] [address] [phone]

## 2022-08-27 ENCOUNTER — Encounter: Payer: Self-pay | Admitting: Family Medicine

## 2022-08-27 ENCOUNTER — Ambulatory Visit: Payer: Medicare Other | Admitting: Family Medicine

## 2022-08-27 DIAGNOSIS — G500 Trigeminal neuralgia: Secondary | ICD-10-CM

## 2022-08-27 DIAGNOSIS — M792 Neuralgia and neuritis, unspecified: Secondary | ICD-10-CM

## 2022-08-27 DIAGNOSIS — M5481 Occipital neuralgia: Secondary | ICD-10-CM

## 2022-08-31 ENCOUNTER — Other Ambulatory Visit: Payer: Self-pay | Admitting: Physician Assistant

## 2022-09-02 ENCOUNTER — Telehealth: Payer: Self-pay | Admitting: Interventional Cardiology

## 2022-09-02 MED ORDER — METOPROLOL SUCCINATE ER 50 MG PO TB24: 50.0000 mg | ORAL_TABLET | Freq: Every day | ORAL | 0 refills | Status: DC

## 2022-09-02 MED ORDER — ATORVASTATIN CALCIUM 80 MG PO TABS: 80.0000 mg | ORAL_TABLET | Freq: Every day | ORAL | 0 refills | Status: DC

## 2022-09-02 NOTE — Telephone Encounter (Signed)
*  STAT* If patient is at the pharmacy, call can be transferred to refill team.   1. Which medications need to be refilled? (please list name of each medication and dose if known)   metoprolol succinate (TOPROL-XL) 50 MG 24 hr tablet    atorvastatin (LIPITOR) 80 MG tablet    2. Which pharmacy/location (including street and city if local pharmacy) is medication to be sent to?  [facility] [address] [place], [place] - [address]      3. Do they need a 30 day or 90 day supply? 30 day    Pt is out of medication and has office visit scheduled in November.

## 2022-09-02 NOTE — Telephone Encounter (Signed)
Pt's medication was sent to pt's pharmacy as requested. Confirmation received.  °

## 2022-09-04 DIAGNOSIS — H0589 Other disorders of orbit: Secondary | ICD-10-CM | POA: Diagnosis not present

## 2022-09-04 DIAGNOSIS — H33103 Unspecified retinoschisis, bilateral: Secondary | ICD-10-CM | POA: Diagnosis not present

## 2022-09-04 DIAGNOSIS — H02051 Trichiasis without entropian right upper eyelid: Secondary | ICD-10-CM | POA: Diagnosis not present

## 2022-09-04 DIAGNOSIS — H04123 Dry eye syndrome of bilateral lacrimal glands: Secondary | ICD-10-CM | POA: Diagnosis not present

## 2022-09-04 DIAGNOSIS — H353134 Nonexudative age-related macular degeneration, bilateral, advanced atrophic with subfoveal involvement: Secondary | ICD-10-CM | POA: Diagnosis not present

## 2022-09-04 DIAGNOSIS — H40013 Open angle with borderline findings, low risk, bilateral: Secondary | ICD-10-CM | POA: Diagnosis not present

## 2022-09-09 DIAGNOSIS — K080 Exfoliation of teeth due to systemic causes: Secondary | ICD-10-CM | POA: Diagnosis not present

## 2022-09-25 ENCOUNTER — Telehealth: Payer: Self-pay | Admitting: Interventional Cardiology

## 2022-09-25 MED ORDER — LISINOPRIL 10 MG PO TABS: 10.0000 mg | ORAL_TABLET | Freq: Every day | ORAL | 0 refills | Status: DC

## 2022-09-25 NOTE — Telephone Encounter (Signed)
Refills sent to requesting pharmacy  

## 2022-09-25 NOTE — Telephone Encounter (Signed)
*  STAT* If patient is at the pharmacy, call can be transferred to refill team.   1. Which medications need to be refilled? (please list name of each medication and dose if known) lisinopril (ZESTRIL) 10 MG tablet    2. Would you like to learn more about the convenience, safety, & potential cost savings by using the [facility]?      3. Are you open to using the [facility] (Type [facility]. ).   4. Which pharmacy/location (including street and city if local pharmacy) is medication to be sent to? [facility] [address] [place], [place] - [address]    5. Do they need a 30 day or 90 day supply? 90 day  Patient has appt on 11/22/22

## 2022-09-27 DIAGNOSIS — K080 Exfoliation of teeth due to systemic causes: Secondary | ICD-10-CM | POA: Diagnosis not present

## 2022-10-17 DIAGNOSIS — K080 Exfoliation of teeth due to systemic causes: Secondary | ICD-10-CM | POA: Diagnosis not present

## 2022-10-22 DIAGNOSIS — L603 Nail dystrophy: Secondary | ICD-10-CM | POA: Diagnosis not present

## 2022-10-22 DIAGNOSIS — I739 Peripheral vascular disease, unspecified: Secondary | ICD-10-CM | POA: Diagnosis not present

## 2022-10-22 DIAGNOSIS — L84 Corns and callosities: Secondary | ICD-10-CM | POA: Diagnosis not present

## 2022-11-21 DIAGNOSIS — K589 Irritable bowel syndrome without diarrhea: Secondary | ICD-10-CM | POA: Diagnosis not present

## 2022-11-21 DIAGNOSIS — H353211 Exudative age-related macular degeneration, right eye, with active choroidal neovascularization: Secondary | ICD-10-CM | POA: Diagnosis not present

## 2022-11-21 NOTE — Progress Notes (Signed)
Cardiology Office Note    Date:  11/22/2022  ID:  Billy Knox, DOB 1940-09-23, MRN [id number] PCP:  Gweneth Dimitri, MD  Cardiologist:  Lance Muss, MD  Electrophysiologist:  None   Chief Complaint: f/u CAD  History of Present Illness: .    Billy Knox is a 82 y.o. male with visit-pertinent history of CAD (CTO of RCA), trigeminal neuralgia s/p gamma knife surgery, esophageal dilation with relief of chest pain in 2016, macular degeneration, HTN, HLD, GERD who is seen for follow-up.    Per Dr. Hoyle Barr note, he has history of CAD with CTO of RCA diagnosed many years ago with L-R collaterals, managed medically. Cath report not available in EMR. He had episode of CP in 12/2014 with Schatzki's ring diagnosed at that time with relief of pain with dilation. In early 2018, he had a syncopal episode after neurosurgery in 11/17 (Nucleus Caudalis Dorsal root entry zone, for facial pain from trigeminal neuralgia). He had a broken left eye socket. He thinks he was overmedicated with his neurologic medications. Since reducing the dosages, he has not had any further syncope. He required dose reduction of meds in 2023 for soft BPs. 2D echo showed EF 55-60%, g1DD, mild AI. He enjoys audiobooks that sync to his hearing aid.   He is seen for follow-up today feeling great without any interim anginal symptoms. No CP, SOB, palpitations, edema, falls, recurrent syncope. Tolerating all medication well. No concerns today. Keeps diligent records of his doctors and medication.  Labwork independently reviewed: [facility] 06/2022 BUN 13, Cr 0.83, LFTs wnl, LDL 55, trig 183, K 4.5, Hgb 14.9, plt 186, TSH wnl  ROS: .    Please see the history of present illness.  All other systems are reviewed and otherwise negative.  Studies Reviewed: Marland Kitchen    EKG:  EKG is ordered today, personally reviewed, demonstrating NSR 62bpm, early ST upsloping in precordial leads with TWI avL stable from prior  CV Studies: Cardiac studies  reviewed are outlined and summarized above. Otherwise please see EMR for full report.   Current Reported Medications:.    Current Meds  Medication Sig   acetaminophen (TYLENOL) 500 MG tablet Take 500-1,000 mg by mouth every 6 (six) hours as needed for mild pain or headache.   aspirin EC 81 MG tablet Take 81 mg by mouth in the morning.   atorvastatin (LIPITOR) 80 MG tablet Take 1 tablet (80 mg total) by mouth daily.   finasteride (PROSCAR) 5 MG tablet Take 5 mg by mouth in the morning.   gabapentin (NEURONTIN) 400 MG capsule TAKE 1 CAPSULE BY MOUTH 4 TIMES DAILY (EVERY 5 HOURS) (Patient taking differently: Take 400 mg by mouth in the morning, at noon, in the evening, and at bedtime. (0700, 1200, 1700 & 2200))   Glucosamine HCl (GLUCOSAMINE PO) Take 1,000 mg by mouth in the morning.   LINZESS 145 MCG CAPS capsule Take 145 mcg by mouth every morning.   lisinopril (ZESTRIL) 10 MG tablet Take 1 tablet (10 mg total) by mouth daily.   metoprolol succinate (TOPROL-XL) 50 MG 24 hr tablet Take 1 tablet (50 mg total) by mouth daily.   Multiple Vitamin (MULTIVITAMIN WITH MINERALS) TABS tablet Take 1 tablet by mouth in the morning.   nitroGLYCERIN (NITROSTAT) 0.4 MG SL tablet Place 1 tablet (0.4 mg total) under the tongue every 5 (five) minutes as needed for chest pain.   Omega-3 Fatty Acids (FISH OIL) 1200 MG CAPS Take 1,200 mg by mouth in  the morning.   PRILOSEC OTC 20 MG tablet Take 20 mg by mouth at bedtime.   RESTASIS 0.05 % ophthalmic emulsion Place 1 drop into both eyes at bedtime.   tamsulosin (FLOMAX) 0.4 MG CAPS capsule Take 0.4 mg by mouth every evening.    Physical Exam:    VS:  BP 128/76   Pulse 68   Ht 6' (1.829 m)   Wt 223 lb 9.6 oz (101.4 kg)   SpO2 98%   BMI 30.33 kg/m    Wt Readings from Last 3 Encounters:  11/22/22 223 lb 9.6 oz (101.4 kg)  04/16/22 207 lb (93.9 kg)  03/05/22 212 lb 15.4 oz (96.6 kg)    GEN: Well nourished, well developed in no acute distress NECK: No  JVD; No carotid bruits CARDIAC: RRR, no murmurs, rubs, gallops RESPIRATORY:  Clear to auscultation without rales, wheezing or rhonchi  ABDOMEN: Soft, non-tender, non-distended EXTREMITIES:  No edema; No acute deformity   Asessement and Plan:.    1. CAD - continue ASA 81mg  daily, Toprol 50mg  daily, atorvastatin 80mg  daily. He is unsure if he needs any refills - he will check when he gets home and will notify his pharmacy. Labs reviewed from PCP visit 06/2022.  2. HTN - controlled on present regimen to include metoprolol, lisinopril at doses above. Labs stable 06/2022. No changes today.  3. HLD - lipids updated 06/2022 and LDL at goal. Continue atorvastatin 80mg  daily.  4. History of syncope - no recurrence. Remains reasonably active. He will notify for any concerning symptoms.    Disposition: F/u with Dr. Izora Ribas in 1 year to establish care.  Signed, Laurann Montana, PA-C

## 2022-11-22 ENCOUNTER — Encounter: Payer: Self-pay | Admitting: Physician Assistant

## 2022-11-22 ENCOUNTER — Ambulatory Visit: Payer: Medicare Other | Attending: Physician Assistant | Admitting: Physician Assistant

## 2022-11-22 VITALS — BP 128/76 | HR 68 | Ht 72.0 in | Wt 223.6 lb

## 2022-11-22 DIAGNOSIS — I10 Essential (primary) hypertension: Secondary | ICD-10-CM | POA: Diagnosis not present

## 2022-11-22 DIAGNOSIS — I2510 Atherosclerotic heart disease of native coronary artery without angina pectoris: Secondary | ICD-10-CM | POA: Diagnosis not present

## 2022-11-22 DIAGNOSIS — E785 Hyperlipidemia, unspecified: Secondary | ICD-10-CM | POA: Diagnosis not present

## 2022-11-22 DIAGNOSIS — Z87898 Personal history of other specified conditions: Secondary | ICD-10-CM | POA: Diagnosis not present

## 2022-11-22 NOTE — Patient Instructions (Signed)
Medication Instructions:  Your physician recommends that you continue on your current medications as directed. Please refer to the Current Medication list given to you today.  *If you need a refill on your cardiac medications before your next appointment, please call your pharmacy*   Lab Work: NONE If you have labs (blood work) drawn today and your tests are completely normal, you will receive your results only by: MyChart Message (if you have MyChart) OR A paper copy in the mail If you have any lab test that is abnormal or we need to change your treatment, we will call you to review the results.   Testing/Procedures: EKG   Follow-Up: At [facility], you and your health needs are our priority.  As part of our continuing mission to provide you with exceptional heart care, we have created designated Provider Care Teams.  These Care Teams include your primary Cardiologist (physician) and Advanced Practice Providers (APPs -  Physician Assistants and Nurse Practitioners) who all work together to provide you with the care you need, when you need it.  We recommend signing up for the patient portal called "MyChart".  Sign up information is provided on this After Visit Summary.  MyChart is used to connect with patients for Virtual Visits (Telemedicine).  Patients are able to view lab/test results, encounter notes, upcoming appointments, etc.  Non-urgent messages can be sent to your provider as well.   To learn more about what you can do with MyChart, go to [email or web address].    Your next appointment:   1 year(s)  Provider:   DR. Izora Ribas

## 2022-11-25 ENCOUNTER — Telehealth: Payer: Self-pay | Admitting: Family Medicine

## 2022-11-25 DIAGNOSIS — M792 Neuralgia and neuritis, unspecified: Secondary | ICD-10-CM

## 2022-11-25 DIAGNOSIS — M5481 Occipital neuralgia: Secondary | ICD-10-CM

## 2022-11-25 DIAGNOSIS — G500 Trigeminal neuralgia: Secondary | ICD-10-CM

## 2022-11-25 MED ORDER — GABAPENTIN 400 MG PO CAPS
ORAL_CAPSULE | ORAL | 1 refills | Status: DC
Start: 2022-11-25 — End: 2023-05-28

## 2022-11-25 NOTE — Telephone Encounter (Signed)
Last seen on 08/21/21 Follow up scheduled on 03/06/23

## 2022-11-25 NOTE — Telephone Encounter (Signed)
Pt request refill for gabapentin (NEURONTIN) 400 MG capsule send to  [facility] [phone]

## 2022-12-03 ENCOUNTER — Other Ambulatory Visit: Payer: Self-pay | Admitting: Physician Assistant

## 2022-12-06 DIAGNOSIS — K219 Gastro-esophageal reflux disease without esophagitis: Secondary | ICD-10-CM | POA: Diagnosis not present

## 2022-12-06 DIAGNOSIS — K5904 Chronic idiopathic constipation: Secondary | ICD-10-CM | POA: Diagnosis not present

## 2022-12-08 ENCOUNTER — Other Ambulatory Visit: Payer: Self-pay | Admitting: Interventional Cardiology

## 2022-12-10 ENCOUNTER — Other Ambulatory Visit: Payer: Self-pay | Admitting: Interventional Cardiology

## 2022-12-19 DIAGNOSIS — M722 Plantar fascial fibromatosis: Secondary | ICD-10-CM | POA: Diagnosis not present

## 2022-12-19 DIAGNOSIS — M71572 Other bursitis, not elsewhere classified, left ankle and foot: Secondary | ICD-10-CM | POA: Diagnosis not present

## 2022-12-19 DIAGNOSIS — M7732 Calcaneal spur, left foot: Secondary | ICD-10-CM | POA: Diagnosis not present

## 2022-12-26 DIAGNOSIS — M71572 Other bursitis, not elsewhere classified, left ankle and foot: Secondary | ICD-10-CM | POA: Diagnosis not present

## 2022-12-26 DIAGNOSIS — M722 Plantar fascial fibromatosis: Secondary | ICD-10-CM | POA: Diagnosis not present

## 2023-01-10 DIAGNOSIS — M71572 Other bursitis, not elsewhere classified, left ankle and foot: Secondary | ICD-10-CM | POA: Diagnosis not present

## 2023-01-10 DIAGNOSIS — M722 Plantar fascial fibromatosis: Secondary | ICD-10-CM | POA: Diagnosis not present

## 2023-01-21 DIAGNOSIS — I739 Peripheral vascular disease, unspecified: Secondary | ICD-10-CM | POA: Diagnosis not present

## 2023-01-21 DIAGNOSIS — L603 Nail dystrophy: Secondary | ICD-10-CM | POA: Diagnosis not present

## 2023-01-21 DIAGNOSIS — L84 Corns and callosities: Secondary | ICD-10-CM | POA: Diagnosis not present

## 2023-02-26 DIAGNOSIS — R351 Nocturia: Secondary | ICD-10-CM | POA: Diagnosis not present

## 2023-02-26 DIAGNOSIS — N401 Enlarged prostate with lower urinary tract symptoms: Secondary | ICD-10-CM | POA: Diagnosis not present

## 2023-02-27 DIAGNOSIS — K080 Exfoliation of teeth due to systemic causes: Secondary | ICD-10-CM | POA: Diagnosis not present

## 2023-03-04 DIAGNOSIS — R059 Cough, unspecified: Secondary | ICD-10-CM | POA: Diagnosis not present

## 2023-03-04 DIAGNOSIS — R509 Fever, unspecified: Secondary | ICD-10-CM | POA: Diagnosis not present

## 2023-03-04 DIAGNOSIS — R52 Pain, unspecified: Secondary | ICD-10-CM | POA: Diagnosis not present

## 2023-03-04 DIAGNOSIS — J101 Influenza due to other identified influenza virus with other respiratory manifestations: Secondary | ICD-10-CM | POA: Diagnosis not present

## 2023-03-04 DIAGNOSIS — J3489 Other specified disorders of nose and nasal sinuses: Secondary | ICD-10-CM | POA: Diagnosis not present

## 2023-03-04 DIAGNOSIS — R5383 Other fatigue: Secondary | ICD-10-CM | POA: Diagnosis not present

## 2023-03-05 NOTE — Patient Instructions (Incomplete)

## 2023-03-05 NOTE — Progress Notes (Deleted)
No chief complaint on file.   HISTORY OF PRESENT ILLNESS:  03/05/23 ALL:  Billy Knox returns for follow up for trigeminal neuralgia. He continues gabapentin 400mg  QID.   08/16/2021 ALL: Billy Knox returns for follow up for trigeminal neuralgia. He continues gabapentin 400mg  QID. He has to be very consistent with scheduling or pain is unbearable. He feels most difficulty time is from bedtime dose to morning. He feels he is doing fairly well at this time.   08/14/2020 ALL: Billy Knox is a 83 y.o. male here today for follow up for trigeminal neuralgia. He was switched to Gralise, however, he did not feel it worked as well as gabapentin. He continues gabapentin 400mg  QID. He has weaned lamotrigine . He has not taken this medication in over a year and feels he has been doing well. If he misses a dose of gabapentin, pain becomes unbearable in about 2 hours. He has an alarm for 7am, 12pm, 5pm and 10pm. He has more pain in the morning and can tell its time for his medication. He does not wish to adjust dose due to feeling groggy. He has intermittent burning of the right occipital region. He see a chiropractor who uses acupuncture and pressure point treatment that helps. He is followed by ophthalmology for macular degeneration.   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  In the past he had done well with 300 mg gabapentin 4-5 times a day but if he misses a dose or is delayed the pain comes back, he not only has trigeminal neuralgia but he also has neuropathic pain from surgery.  We have tried multiple medications, gabapentin, Lamictal, at this time we will try to increase his gabapentin but he has been having a hard time tolerating it not sure  he can tolerate higher IR, we will also try to get gabapentin extended release.   Interval history 10/02/2017 AA: He is here for follow up of Trigeminal Neuralgia. Extensive history of this including surgeries and failure of multiple meds including botox. He is on Gabapentin and Lamictal. He says he feels worse with increased area of pain on the right in the trigeminal area, and in the incision site. If he is having a bad day he takes Gabapentin. Still having ififculty touching face, especially shaving. He has lost more taste.  He also feels he has lost smell and taste. He denies any tremors of the hands or falls. He has a slight head tremor that he notices nothing significant. He walking well, no falls. Discussed he is not parkinsonian on exam. Otherwise for TGN, the medicine is working, if he misses the pain gets worse. Feels pain is "under control" currently. Discussed increased Gabapentin can take it every 3 hours as needed, may increase Lamictal as well. Discussed. He has ben to [facility] and multiple other institutions. Injecting alcohol into the nerve helped once.   Interval history 09/24/2016 AA: botulinum toxin in the right trigeminal area did not help. He has continued pain in the are of the scar and now getting more pain in the occipital area, sharp, burning since the surgery suspect injury to the occipital nerve. He takes a 300mg  gabapentin every 5.5 hours which helps. He has not tried Lamictal, baclofen, topiramate, valproate. Will try  Lamictal   HPI:  Billy Knox is a 83 y.o. male here as a referral from Dr. Clarene [facility] for trigeminal neuralgia. 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 He is status post gamma knife radiosurgical rhizotomy in 2012 and recent dorsal root entry zone surgery. He has had 4 surgeries as far back as the 90s. Started 25 years ago first surgery  was in 1992. He has seen in December at [facility] correct and his neurontin was increased and he had a fall. He lost feeling in his left lip after recent fall (see CT below). Yesterday he had pain like a hot poker through the scalp (Right high parietal lobe) and now pain around the right side of the ear. Since the DREZ he still gets flashes of pain in the right side of the face. Feels like someone has hit him all the time. Throbbing and pounding. With light and sound sensitivity and nausea. No medication overuse. He says the trigeminal nerve pain shoots into all three zones (v1,v2,v3). When he first got the trigeminal neuralgia food would trigger it. He has also been to the head of Neurology at [facility] as well as [facility]. Unknown triggers, sporadic, sometimes touching his face with cause the pain, turning the head, wind and chewing. Lightning, severe, brief. He has an aching pain on the right which is constant. Like a pressure. He has daily headaches and half of them a month are migrainous (unilateral right side of head, light, sound sensitivity, pounding, throbbing, nausea) ongoing at this frequency for years. No aura. No medication overuse. He also has neck pain and stiff muscles on the right with decreased ROM, slowly progressive, cannot use muscle relaxers due to side effects and sedation and risk of falls, has tried massage and PT without help, pain and decreased ROM.   Meds tried include: Oxcarbazepine, Vimpat, gabapentin, clonazepam, tiagabine, oxycodone, flexeril, tramadol, naltrexone, Tegretol (did not help).   Reviewed notes, labs and imaging from outside physicians, which showed:   CBC showed elevated white blood cells 3 weeks ago 11.8 with neutrophilic predominance otherwise normal. CMP showed normal sodium, chloride 100, glucose 108, total protein 6 otherwise unremarkable.   Personally reviewed images CT of the head and agree with the following:   IMPRESSION: CT head 02/26/2016 (in ED  for a fall): Atrophy with supratentorial small vessel disease. No intracranial mass hemorrhage, or extra-axial fluid collection. No acute infarct evident. Previous surgical removal a portion of the lateral right occipital bone. Areas of arterial vascular calcification noted.   Primary diagnosis patient is suffering from trigeminal neuralgia for several decades. He was operated with microvascular decompression several times also treated with gamma knife without much effect. He recently stopped oxycodone. He was recently treated with DREZ by Dr. Claudette Laws and is complaining about postprocedural resurgence of neuralgic pain in V2 and also retro-orbital pain always on the right side. Diagnosed with postprocedural flare up. They added low-dose naltrexone increased gabapentin and also added Vimpat. Repeating his neuro imaging was suggested.   REVIEW OF SYSTEMS: Out of a complete 14 system review of symptoms, the patient complains only of the following symptoms,difficulty with vision, neuropathic pain and all other reviewed systems are negative.   ALLERGIES: No Known Allergies   HOME MEDICATIONS: Outpatient Medications Prior to Visit  Medication Sig Dispense Refill   acetaminophen (TYLENOL) 500 MG tablet Take 500-1,000 mg by mouth every 6 (six) hours as needed for  mild pain or headache.     aspirin EC 81 MG tablet Take 81 mg by mouth in the morning.     atorvastatin (LIPITOR) 80 MG tablet Take 1 tablet by mouth once daily 90 tablet 3   finasteride (PROSCAR) 5 MG tablet Take 5 mg by mouth in the morning.     gabapentin (NEURONTIN) 400 MG capsule TAKE 1 CAPSULE BY MOUTH 4 TIMES DAILY (EVERY 5 HOURS) 360 capsule 1   Glucosamine HCl (GLUCOSAMINE PO) Take 1,000 mg by mouth in the morning.     LINZESS 145 MCG CAPS capsule Take 145 mcg by mouth every morning.     lisinopril (ZESTRIL) 10 MG tablet Take 1 tablet by mouth once daily 90 tablet 3   metoprolol succinate (TOPROL-XL) 50 MG 24 hr tablet Take 1 tablet  by mouth once daily 90 tablet 3   Multiple Vitamin (MULTIVITAMIN WITH MINERALS) TABS tablet Take 1 tablet by mouth in the morning.     nitroGLYCERIN (NITROSTAT) 0.4 MG SL tablet Place 1 tablet (0.4 mg total) under the tongue every 5 (five) minutes as needed for chest pain. 25 tablet 6   Omega-3 Fatty Acids (FISH OIL) 1200 MG CAPS Take 1,200 mg by mouth in the morning.     PRILOSEC OTC 20 MG tablet Take 20 mg by mouth at bedtime.     RESTASIS 0.05 % ophthalmic emulsion Place 1 drop into both eyes at bedtime.     tamsulosin (FLOMAX) 0.4 MG CAPS capsule Take 0.4 mg by mouth every evening.  11   No facility-administered medications prior to visit.     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   Sudden blockage of esophagus 07/2017   Trigeminal neuralgia      PAST SURGICAL HISTORY: Past Surgical History:  Procedure Laterality Date   BALLOON DILATION N/A 04/16/2022   Procedure: BALLOON DILATION;  Surgeon: Charlott Rakes, MD;  Location: [facility];  Service: Gastroenterology;  Laterality: N/A;   CYSTOSCOPY     ESOPHAGOGASTRODUODENOSCOPY     ESOPHAGOGASTRODUODENOSCOPY  06/14/2011   Procedure: ESOPHAGOGASTRODUODENOSCOPY (EGD);  Surgeon: Vertell Novak., MD;  Location: [facility];  Service: Endoscopy;  Laterality: N/A;   ESOPHAGOGASTRODUODENOSCOPY N/A 07/28/2017   Procedure: ESOPHAGOGASTRODUODENOSCOPY (EGD);  Surgeon: Carman Ching, MD;   Location: [facility];  Service: Endoscopy;  Laterality: N/A;   ESOPHAGOGASTRODUODENOSCOPY N/A 08/19/2021   Procedure: ESOPHAGOGASTRODUODENOSCOPY (EGD);  Surgeon: Willis Modena, MD;  Location: [facility];  Service: Gastroenterology;  Laterality: N/A;   ESOPHAGOGASTRODUODENOSCOPY N/A 03/05/2022   Procedure: ESOPHAGOGASTRODUODENOSCOPY (EGD);  Surgeon: Willis Modena, MD;  Location: [facility];  Service: Gastroenterology;  Laterality: N/A;   ESOPHAGOGASTRODUODENOSCOPY (EGD) WITH PROPOFOL N/A 08/12/2014   Procedure: ESOPHAGOGASTRODUODENOSCOPY (EGD) WITH PROPOFOL;  Surgeon: Carman Ching, MD;  Location: [facility];  Service: Endoscopy;  Laterality: N/A;  ESOPHAGOGASTRODUODENOSCOPY (EGD) WITH PROPOFOL N/A 10/29/2016   Procedure: ESOPHAGOGASTRODUODENOSCOPY (EGD) WITH PROPOFOL;  Surgeon: Carman Ching, MD;  Location: [facility];  Service: Endoscopy;  Laterality: N/A;   ESOPHAGOGASTRODUODENOSCOPY (EGD) WITH PROPOFOL N/A 05/31/2021   Procedure: ESOPHAGOGASTRODUODENOSCOPY (EGD) WITH PROPOFOL;  Surgeon: Charlott Rakes, MD;  Location: [facility];  Service: Gastroenterology;  Laterality: N/A;   ESOPHAGOGASTRODUODENOSCOPY (EGD) WITH PROPOFOL N/A 04/16/2022   Procedure: ESOPHAGOGASTRODUODENOSCOPY (EGD) WITH PROPOFOL;  Surgeon: Charlott Rakes, MD;  Location: [facility];  Service: Gastroenterology;  Laterality: N/A;   ESOPHAGOSCOPY N/A 07/23/2021   Procedure: ESOPHAGOSCOPY;  Surgeon: Vida Rigger, MD;  Location: [facility];  Service: Gastroenterology;  Laterality: N/A;   FOREIGN BODY REMOVAL  07/28/2017   Procedure: FOREIGN BODY REMOVAL;  Surgeon: Carman Ching, MD;  Location: [facility];  Service: Endoscopy;;   FOREIGN BODY REMOVAL  05/31/2021   Procedure: FOREIGN BODY REMOVAL;  Surgeon: Charlott Rakes, MD;  Location: [facility];  Service: Gastroenterology;;   FOREIGN BODY REMOVAL N/A 08/19/2021   Procedure: FOREIGN BODY REMOVAL;  Surgeon: Willis Modena, MD;  Location: [facility];   Service: Gastroenterology;  Laterality: N/A;   FOREIGN BODY REMOVAL  03/05/2022   Procedure: FOREIGN BODY REMOVAL;  Surgeon: Willis Modena, MD;  Location: [facility];  Service: Gastroenterology;;   gamma knife     3 yrs ago [facility]"brain area around trigeminal nerve area"   PROSTATE BIOPSY     x 3   SAVORY DILATION  06/14/2011   Procedure: SAVORY DILATION;  Surgeon: Vertell Novak., MD;  Location: [facility];  Service: Endoscopy;  Laterality: N/A;  need xray   SAVORY DILATION N/A 08/12/2014   Procedure: SAVORY DILATION;  Surgeon: Carman Ching, MD;  Location: [facility];  Service: Endoscopy;  Laterality: N/A;   SAVORY DILATION N/A 10/29/2016   Procedure: SAVORY DILATION;  Surgeon: Carman Ching, MD;  Location: [facility];  Service: Endoscopy;  Laterality: N/A;   TONSILLECTOMY      child   TRIGEMINAL NERVE BALLOON DECOMPRESSION Right    right ear deafness x 4 total     FAMILY HISTORY: Family History  Problem Relation Age of Onset   Colon cancer Mother    Heart disease Mother    Liver cancer Sister    Alcohol abuse Father      SOCIAL HISTORY: Social History   Socioeconomic History   Marital status: Married    Spouse name: Not on file   Number of children: 1   Years of education: 12   Highest education level: Not on file  Occupational History   Occupation: Retired  Tobacco Use   Smoking status: Former    Current packs/day: 0.00    Types: Cigarettes    Quit date: 06/14/1967    Years since quitting: 55.7   Smokeless tobacco: Never  Vaping Use   Vaping status: Never Used  Substance and Sexual Activity   Alcohol use: No    Alcohol/week: 1.0 standard drink of alcohol    Types: 1 Standard drinks or equivalent per week   Drug use: No   Sexual activity: Not on file  Other Topics Concern   Not on file  Social History Narrative   Lives at home w/ his wife   Right-handed   Caffeine: 2 cups per day   Social Drivers of Corporate investment banker Strain:  Not on file  Food Insecurity: Not on file  Transportation Needs: Not on file  Physical Activity: Not on file  Stress: Not on file  Social  Connections: Not on file  Intimate Partner Violence: Not on file     PHYSICAL EXAM  There were no vitals filed for this visit.   There is no height or weight on file to calculate BMI.   Generalized: Well developed, in no acute distress  Cardiology: normal rate and rhythm, no murmur auscultated  Respiratory: clear to auscultation bilaterally    Neurological examination  Mentation: Alert oriented to time, place, history taking. Follows all commands speech and language fluent Cranial nerve II-XII: Pupils were equal round reactive to light. Extraocular movements were full, visual field were full on confrontational test. Facial sensation and strength were normal. Head turning and shoulder shrug  were normal and symmetric. Motor: The motor testing reveals 5 over 5 strength of all 4 extremities. Good symmetric motor tone is noted throughout.  Gait and station: Gait is normal.    DIAGNOSTIC DATA (LABS, IMAGING, TESTING) - I reviewed patient records, labs, notes, testing and imaging myself where available.  Lab Results  Component Value Date   WBC 8.2 05/07/2021   HGB 14.4 05/07/2021   HCT 42.8 05/07/2021   MCV 93 05/07/2021   PLT 188 05/07/2021      Component Value Date/Time   NA 141 05/22/2021 1022   K 4.2 05/22/2021 1022   CL 103 05/22/2021 1022   CO2 27 05/22/2021 1022   GLUCOSE 101 (H) 05/22/2021 1022   GLUCOSE 108 (H) 02/26/2016 1112   BUN 13 05/22/2021 1022   CREATININE 0.86 05/22/2021 1022   CALCIUM 8.8 05/22/2021 1022   PROT 5.8 (L) 05/22/2021 1022   ALBUMIN 4.0 05/22/2021 1022   AST 19 05/22/2021 1022   ALT 24 05/22/2021 1022   ALKPHOS 56 05/22/2021 1022   BILITOT 0.7 05/22/2021 1022   GFRNONAA >60 02/26/2016 1112   GFRAA >60 02/26/2016 1112   Lab Results  Component Value Date   CHOL 127 03/19/2017   HDL 34 (L)  03/19/2017   LDLCALC 74 03/19/2017   TRIG 97 03/19/2017   CHOLHDL 3.7 03/19/2017   No results found for: "HGBA1C" No results found for: "VITAMINB12" Lab Results  Component Value Date   TSH 4.050 05/07/2021        No data to display               No data to display           ASSESSMENT AND PLAN  83 y.o. year old male  has a past medical history of Cancer (HCC), Colon polyp, Coronary artery disease, Deafness in right ear, Esophageal stricture, GERD (gastroesophageal reflux disease), Hyperchloremia, Hypertension, Macular degeneration, Myocardial infarction (HCC) (2007), Ringing in ears, Rt facial numbness, Sacroiliac inflammation (HCC), Sudden blockage of esophagus (07/2017), and Trigeminal neuralgia. here with    No diagnosis found.  Ada is doing well, today. He feels trigeminal and occipital pain are well managed on gabapentin 400mg  QID. We will continue current treatment plan. We discussed adding 400mg  at 3am if morning pain become unbearable. He is not interested in adding other meds or referral at this time. He will continue close follow up with care team. Healthy lifestyle habits encouraged. He will follow up in 1 year, sooner if needed.   No orders of the defined types were placed in this encounter.     No orders of the defined types were placed in this encounter.      Shawnie Dapper, MSN, FNP-C 03/05/2023, 10:26 AM  [facility] [address] [place], [place]  [address] [phone]  

## 2023-03-06 ENCOUNTER — Encounter: Payer: Self-pay | Admitting: Family Medicine

## 2023-03-06 ENCOUNTER — Ambulatory Visit: Payer: Medicare Other | Admitting: Family Medicine

## 2023-03-06 DIAGNOSIS — G500 Trigeminal neuralgia: Secondary | ICD-10-CM

## 2023-03-10 ENCOUNTER — Telehealth: Payer: Self-pay | Admitting: Family Medicine

## 2023-03-10 NOTE — Telephone Encounter (Signed)
 Pt called to r/s his appointment with wait list

## 2023-04-25 DIAGNOSIS — R059 Cough, unspecified: Secondary | ICD-10-CM | POA: Diagnosis not present

## 2023-05-22 ENCOUNTER — Ambulatory Visit: Admitting: Podiatry

## 2023-05-22 DIAGNOSIS — K080 Exfoliation of teeth due to systemic causes: Secondary | ICD-10-CM | POA: Diagnosis not present

## 2023-05-28 ENCOUNTER — Encounter: Payer: Self-pay | Admitting: Podiatry

## 2023-05-28 ENCOUNTER — Telehealth: Payer: Self-pay | Admitting: Family Medicine

## 2023-05-28 ENCOUNTER — Ambulatory Visit: Admitting: Podiatry

## 2023-05-28 DIAGNOSIS — B351 Tinea unguium: Secondary | ICD-10-CM | POA: Diagnosis not present

## 2023-05-28 DIAGNOSIS — M79672 Pain in left foot: Secondary | ICD-10-CM

## 2023-05-28 DIAGNOSIS — M792 Neuralgia and neuritis, unspecified: Secondary | ICD-10-CM

## 2023-05-28 DIAGNOSIS — M79671 Pain in right foot: Secondary | ICD-10-CM

## 2023-05-28 DIAGNOSIS — G500 Trigeminal neuralgia: Secondary | ICD-10-CM

## 2023-05-28 DIAGNOSIS — M5481 Occipital neuralgia: Secondary | ICD-10-CM

## 2023-05-28 MED ORDER — GABAPENTIN 400 MG PO CAPS: ORAL_CAPSULE | ORAL | 0 refills | Status: DC

## 2023-05-28 NOTE — Telephone Encounter (Signed)
 Pt returning call from Nurse. Transfer to nurse

## 2023-05-28 NOTE — Telephone Encounter (Signed)
 Pt called stating that he is needing a refill on his gabapentin  (NEURONTIN ) 400 MG capsule he may have just enough for today. Please send a 90 day qt to the [facility] on [address].

## 2023-05-28 NOTE — Telephone Encounter (Signed)
 Took call from phone room and spoke w/ pt. Scheduled appt for 05/29/23 at 11am with Amy. Check in 1030am.

## 2023-05-28 NOTE — Telephone Encounter (Signed)
 Called pt at [phone]. Each time, someone on other line ended call.   Called [phone]. Wife picked up and states they were both on calls and to call back 5 min to speak with pt.

## 2023-05-28 NOTE — Addendum Note (Signed)
 Addended by: Terrilyn Fick L on: 05/28/2023 04:16 PM   Modules accepted: Orders

## 2023-05-28 NOTE — Telephone Encounter (Signed)
 Amy- neither you or Dr. Tresia Fruit have a sooner appt to offer currently. Ok to refill until upcoming appt?  Pt last seen 08/16/2021. Next f/u scheduled for 09/10/23.

## 2023-05-28 NOTE — Progress Notes (Signed)
 Patient presents for evaluation and treatment of tenderness and some redness around nails feet.   Physical exam:  General appearance: Alert, pleasant, and in no acute distress.  Vascular: Pedal pulses: DP palpable bilaterally, PT nonpalpable bilaterally.  Moderate edema lower legs bilaterally  Neurological:  No paresthesias or burning noted.  Dermatologic:  Nails thickened, disfigured, discolored 1-5 BL with subungual debris.  Some redness along nail folds bilaterally but no signs of drainage or infection.  Musculoskeletal:  Hammertoes deformities 2 through 5 bilaterally   Diagnosis: Painful onychomycotic nails 1 through 5 bilaterally. Pain toes 1 through 5 bilaterally.  Plan: Debrided onychomycotic nails 1 through 5 bilaterally.  Return 3 months

## 2023-05-29 ENCOUNTER — Encounter: Payer: Self-pay | Admitting: Family Medicine

## 2023-05-29 ENCOUNTER — Ambulatory Visit: Admitting: Family Medicine

## 2023-05-29 DIAGNOSIS — M5481 Occipital neuralgia: Secondary | ICD-10-CM

## 2023-05-29 DIAGNOSIS — G500 Trigeminal neuralgia: Secondary | ICD-10-CM

## 2023-05-29 DIAGNOSIS — M792 Neuralgia and neuritis, unspecified: Secondary | ICD-10-CM

## 2023-05-29 MED ORDER — GABAPENTIN 100 MG PO CAPS: 100.0000 mg | ORAL_CAPSULE | Freq: Two times a day (BID) | ORAL | 3 refills | Status: AC | PRN

## 2023-05-29 MED ORDER — GABAPENTIN 400 MG PO CAPS
ORAL_CAPSULE | ORAL | 3 refills | Status: AC
Start: 2023-05-29 — End: ?

## 2023-05-29 NOTE — Progress Notes (Signed)
 Chief Complaint  Patient presents with   New Patient (Initial Visit)    Pt in 1 alone Pt here for trigeminal neuralgia f/u Pt states pain is getting worse Pt states right right side of face and right eye is painful     HISTORY OF PRESENT ILLNESS:  05/29/23 ALL: Billy Knox returns for follow up for TN. He was last seen 08/2021 and doing well as long as he remained consistent with gabapentin  400mg  QID. Since, he reports doing fairly well. He usually takes gabapentin  at 7am, 12p, 5p and 10p. He usually feels pain is well managed for about 4 hours then gradually returns. He usually wakes up around 4 to urinate but able to go back to sleep for a little while. Pain doesn't seem worse at that time. There have been days where pain seems to be a little more aggressive. Seems to effect whole eye. Occipital pain waxes and wanes. Neck muscles are weak. PT did not help.   He does not drive. Has macular degeneration. Legally blind for the past 2 years. Followed closely by PCP. He was prescribed Restasis for dry eye but it was not effective. He is using an OTC eye lubricant.   08/16/2021 ALL:   [name] returns for follow up for trigeminal neuralgia. He continues gabapentin  400mg  QID. He has to be very consistent with scheduling or pain is unbearable. He feels most difficulty time is from bedtime dose to morning. He feels he is doing fairly well at this time.   08/14/2020 ALL: Billy Knox is a 83 y.o. male here today for follow up for trigeminal neuralgia. He was switched to Gralise , however, he did not feel it worked as well as gabapentin . He continues gabapentin  400mg  QID. He has weaned lamotrigine  . He has not taken this medication in over a year and feels he has been doing well. If he misses a dose of gabapentin , pain becomes unbearable in about 2 hours. He has an alarm for 7am, 12pm, 5pm and 10pm. He has more pain in the morning and can tell its time for his medication. He does not wish to adjust dose due to  feeling groggy. He has intermittent burning of the right occipital region. He see a chiropractor who uses acupuncture and pressure point treatment that helps. He is followed by ophthalmology for macular degeneration.    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  In the past he had done well with 300 mg gabapentin  4-5 times a day but if he misses a dose or is delayed the pain comes back, he not only has trigeminal neuralgia but he also has neuropathic pain from surgery.  We have tried multiple medications, gabapentin , Lamictal , at this time we will try to increase his gabapentin  but he has been having a hard time tolerating it not sure he can tolerate higher IR, we will also try to get gabapentin  extended release.   Interval history 10/02/2017 AA: He is here for follow up of Trigeminal Neuralgia. Extensive history of this including surgeries and failure of multiple meds including botox. He is on Gabapentin  and Lamictal . He says he feels worse with increased area of pain on the right in the trigeminal area, and in the incision site. If he is  having a bad day he takes Gabapentin . Still having ififculty touching face, especially shaving. He has lost more taste.  He also feels he has lost smell and taste. He denies any tremors of the hands or falls. He has a slight head tremor that he notices nothing significant. He walking well, no falls. Discussed he is not parkinsonian on exam. Otherwise for TGN, the medicine is working, if he misses the pain gets worse. Feels pain is "under control" currently. Discussed increased Gabapentin  can take it every 3 hours as needed, may increase Lamictal  as well. Discussed. He has ben to [facility] and multiple other  institutions. Injecting alcohol into the nerve helped once.   Interval history 09/24/2016 AA: botulinum toxin in the right trigeminal area did not help. He has continued pain in the are of the scar and now getting more pain in the occipital area, sharp, burning since the surgery suspect injury to the occipital nerve. He takes a 300mg  gabapentin  every 5.5 hours which helps. He has not tried Lamictal , baclofen, topiramate, valproate. Will try Lamictal    HPI:  Billy Knox is a 83 y.o. male here as a referral from Dr. Nathen Balder for trigeminal neuralgia. 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 He is status post gamma knife radiosurgical rhizotomy in 2012 and recent dorsal root entry zone surgery. He has had 4 surgeries as far back as the 90s. Started 25 years ago first surgery was in 1992. He has seen in December at [facility] correct and his neurontin  was increased and he had a fall. He lost feeling in his left lip after recent fall (see CT below). Yesterday he had pain like a hot poker through the scalp (Right high parietal lobe) and now pain around the right side of the ear. Since the DREZ he still gets flashes of pain in the right side of the face. Feels like someone has hit him all the time. Throbbing and pounding. With light and sound sensitivity and nausea. No medication overuse. He says the trigeminal nerve pain shoots into all three zones (v1,v2,v3). When he first got the trigeminal neuralgia food would trigger it. He has also been to the head of Neurology at [facility] as well as [facility]. Unknown triggers, sporadic, sometimes touching his face with cause the pain, turning the head, wind and chewing. Lightning, severe, brief. He has an aching pain on the right which is constant. Like a pressure. He has daily headaches and half of them a month are migrainous (unilateral  right side of head, light, sound sensitivity, pounding, throbbing, nausea) ongoing at this frequency for years. No aura. No medication overuse. He also has neck pain and stiff muscles on the right with decreased ROM, slowly progressive, cannot use muscle relaxers due to side effects and sedation and risk of falls, has tried massage and PT without help, pain and decreased ROM.   Meds tried include: Oxcarbazepine, Vimpat, gabapentin , clonazepam, tiagabine, oxycodone, flexeril , tramadol , naltrexone, Tegretol (did not help).   Reviewed notes, labs and imaging from outside physicians, which showed:   CBC showed elevated white blood cells 3 weeks ago 11.8 with neutrophilic predominance otherwise normal. CMP showed normal sodium, chloride 100, glucose 108, total protein 6 otherwise unremarkable.   Personally reviewed images CT of the head and agree with the following:   IMPRESSION: CT head 02/26/2016 (in ED for a fall): Atrophy with supratentorial small vessel disease. No intracranial mass hemorrhage,  or extra-axial fluid collection. No acute infarct evident. Previous surgical removal a portion of the lateral right occipital bone. Areas of arterial vascular calcification noted.   Primary diagnosis patient is suffering from trigeminal neuralgia for several decades. He was operated with microvascular decompression several times also treated with gamma knife without much effect. He recently stopped oxycodone. He was recently treated with DREZ by Dr. Eddy Goodell and is complaining about postprocedural resurgence of neuralgic pain in V2 and also retro-orbital pain always on the right side. Diagnosed with postprocedural flare up. They added low-dose naltrexone increased gabapentin  and also added Vimpat. Repeating his neuro imaging was suggested.   REVIEW OF SYSTEMS: Out of a complete 14 system review of symptoms, the patient complains only of the following symptoms,difficulty with vision, neuropathic pain and all other  reviewed systems are negative.   ALLERGIES: No Known Allergies   HOME MEDICATIONS: Outpatient Medications Prior to Visit  Medication Sig Dispense Refill   acetaminophen (TYLENOL) 500 MG tablet Take 500-1,000 mg by mouth every 6 (six) hours as needed for mild pain or headache.     aspirin EC 81 MG tablet Take 81 mg by mouth in the morning.     atorvastatin  (LIPITOR) 80 MG tablet Take 1 tablet by mouth once daily 90 tablet 3   finasteride (PROSCAR) 5 MG tablet Take 5 mg by mouth in the morning.     Glucosamine HCl (GLUCOSAMINE PO) Take 1,000 mg by mouth in the morning.     LINZESS 145 MCG CAPS capsule Take 145 mcg by mouth every morning.     lisinopril  (ZESTRIL ) 10 MG tablet Take 1 tablet by mouth once daily 90 tablet 3   Magnesium Hydroxide (DULCOLAX PO) Take by mouth as needed.     metoprolol  succinate (TOPROL -XL) 50 MG 24 hr tablet Take 1 tablet by mouth once daily 90 tablet 3   Multiple Vitamin (MULTIVITAMIN WITH MINERALS) TABS tablet Take 1 tablet by mouth in the morning.     Omega-3 Fatty Acids (FISH OIL) 1200 MG CAPS Take 1,200 mg by mouth in the morning.     PRILOSEC OTC 20 MG tablet Take 20 mg by mouth at bedtime.     Propylene Glycol (SYSTANE BALANCE OP) Apply to eye.     tamsulosin (FLOMAX) 0.4 MG CAPS capsule Take 0.4 mg by mouth every evening.  11   triamcinolone ointment (KENALOG) 0.5 % Apply 1 Application topically 2 (two) times daily.     gabapentin  (NEURONTIN ) 400 MG capsule TAKE 1 CAPSULE BY MOUTH 4 TIMES DAILY (EVERY 5 HOURS) 360 capsule 0   nitroGLYCERIN  (NITROSTAT ) 0.4 MG SL tablet Place 1 tablet (0.4 mg total) under the tongue every 5 (five) minutes as needed for chest pain. (Patient not taking: Reported on 05/28/2023) 25 tablet 6   RESTASIS 0.05 % ophthalmic emulsion Place 1 drop into both eyes at bedtime.     No facility-administered medications prior to visit.     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   Sudden blockage of esophagus 07/2017   Trigeminal neuralgia      PAST SURGICAL HISTORY: Past Surgical History:  Procedure Laterality Date   BALLOON DILATION N/A 04/16/2022   Procedure: BALLOON DILATION;  Surgeon: Baldo Bonds, MD;  Location: [facility];  Service: Gastroenterology;  Laterality: N/A;   CYSTOSCOPY     ESOPHAGOGASTRODUODENOSCOPY     ESOPHAGOGASTRODUODENOSCOPY  06/14/2011   Procedure: ESOPHAGOGASTRODUODENOSCOPY (EGD);  Surgeon: Venson Ginger., MD;  Location: [facility];  Service: Endoscopy;  Laterality: N/A;   ESOPHAGOGASTRODUODENOSCOPY N/A 07/28/2017   Procedure: ESOPHAGOGASTRODUODENOSCOPY (EGD);  Surgeon: Jolinda Necessary, MD;  Location: [facility];  Service: Endoscopy;  Laterality: N/A;   ESOPHAGOGASTRODUODENOSCOPY N/A 08/19/2021   Procedure: ESOPHAGOGASTRODUODENOSCOPY (EGD);  Surgeon: Evangeline Hilts, MD;  Location: [facility];  Service: Gastroenterology;  Laterality: N/A;   ESOPHAGOGASTRODUODENOSCOPY N/A 03/05/2022   Procedure: ESOPHAGOGASTRODUODENOSCOPY (EGD);  Surgeon: Evangeline Hilts, MD;  Location: [facility];  Service: Gastroenterology;  Laterality: N/A;   ESOPHAGOGASTRODUODENOSCOPY (EGD) WITH PROPOFOL  N/A 08/12/2014   Procedure: ESOPHAGOGASTRODUODENOSCOPY (EGD) WITH PROPOFOL ;  Surgeon: Jolinda Necessary, MD;  Location: [facility];  Service: Endoscopy;  Laterality: N/A;   ESOPHAGOGASTRODUODENOSCOPY (EGD) WITH PROPOFOL  N/A 10/29/2016   Procedure:  ESOPHAGOGASTRODUODENOSCOPY (EGD) WITH PROPOFOL ;  Surgeon: Jolinda Necessary, MD;  Location: [facility];  Service: Endoscopy;  Laterality: N/A;   ESOPHAGOGASTRODUODENOSCOPY (EGD) WITH PROPOFOL  N/A 05/31/2021   Procedure: ESOPHAGOGASTRODUODENOSCOPY (EGD) WITH PROPOFOL ;  Surgeon: Baldo Bonds, MD;  Location: [facility];  Service: Gastroenterology;  Laterality: N/A;   ESOPHAGOGASTRODUODENOSCOPY (EGD) WITH PROPOFOL  N/A 04/16/2022   Procedure: ESOPHAGOGASTRODUODENOSCOPY (EGD) WITH PROPOFOL ;  Surgeon: Baldo Bonds, MD;  Location: [facility];  Service: Gastroenterology;  Laterality: N/A;   ESOPHAGOSCOPY N/A 07/23/2021   Procedure: ESOPHAGOSCOPY;  Surgeon: Ozell Blunt, MD;  Location: [facility];  Service: Gastroenterology;  Laterality: N/A;   FOREIGN BODY REMOVAL  07/28/2017   Procedure: FOREIGN BODY REMOVAL;  Surgeon: Jolinda Necessary, MD;  Location: [facility];  Service: Endoscopy;;   FOREIGN BODY REMOVAL  05/31/2021   Procedure: FOREIGN BODY REMOVAL;  Surgeon: Baldo Bonds, MD;  Location: [facility];  Service: Gastroenterology;;   FOREIGN BODY REMOVAL N/A 08/19/2021   Procedure: FOREIGN BODY REMOVAL;  Surgeon: Evangeline Hilts, MD;  Location: [facility];  Service: Gastroenterology;  Laterality: N/A;   FOREIGN BODY REMOVAL  03/05/2022   Procedure: FOREIGN BODY REMOVAL;  Surgeon: Evangeline Hilts, MD;  Location: [facility];  Service: Gastroenterology;;   gamma knife     3 yrs ago [facility]"brain area around trigeminal nerve area"   PROSTATE BIOPSY     x 3   SAVORY DILATION  06/14/2011   Procedure: SAVORY DILATION;  Surgeon: Venson Ginger., MD;  Location: [facility];  Service: Endoscopy;  Laterality: N/A;  need xray   SAVORY DILATION N/A 08/12/2014   Procedure: SAVORY DILATION;  Surgeon: Jolinda Necessary, MD;  Location: [facility];  Service: Endoscopy;  Laterality: N/A;   SAVORY DILATION N/A 10/29/2016   Procedure: SAVORY DILATION;  Surgeon: Jolinda Necessary, MD;  Location: [facility];  Service: Endoscopy;  Laterality: N/A;   TONSILLECTOMY      child   TRIGEMINAL NERVE BALLOON DECOMPRESSION Right    right ear deafness x 4 total     FAMILY HISTORY: Family History  Problem Relation Age of Onset   Colon cancer Mother    Heart  disease Mother    Liver cancer Sister    Alcohol abuse Father      SOCIAL HISTORY: Social History   Socioeconomic History   Marital status: Married    Spouse name: Not on file   Number of children: 1   Years of education: 12   Highest education level: Not on file  Occupational History   Occupation: Retired  Tobacco Use   Smoking status: Former    Current packs/day: 0.00    Types: Cigarettes    Quit date: 06/14/1967    Years since quitting: 55.9   Smokeless tobacco: Never  Vaping Use   Vaping status: Never Used  Substance and Sexual Activity   Alcohol use: No    Alcohol/week: 1.0 standard drink of alcohol    Types: 1 Standard drinks or equivalent per week   Drug use: No   Sexual activity: Not on file  Other Topics Concern   Not on file  Social History Narrative   Lives at home w/ his wife   Right-handed   Caffeine: 2 cups per day   Social Drivers of Corporate investment banker Strain: Not on file  Food Insecurity: Not on file  Transportation Needs: Not on file  Physical Activity: Not on file  Stress: Not on file  Social Connections: Not on file  Intimate Partner Violence: Not on file     PHYSICAL EXAM  Vitals:   05/29/23 1051  BP: 120/80  Pulse: 71  SpO2: 96%  Weight: 224 lb (101.6 kg)  Height: 6' (1.829 m)    Body mass index is 30.38 kg/m.   Generalized: Well developed, in no acute distress  Cardiology: normal rate and rhythm, no murmur auscultated  Respiratory: clear to auscultation bilaterally    Neurological examination  Mentation: Alert oriented to time, place, history taking. Follows all commands speech and language fluent Cranial nerve II-XII: Pupils were equal round reactive to  light. Extraocular movements were full, visual field were full on confrontational test. Facial sensation and strength were normal. Head turning and shoulder shrug  were normal and symmetric. Motor: The motor testing reveals 5 over 5 strength of all 4 extremities. Good symmetric motor tone is noted throughout.  Gait and station: Gait is stable with single prong cane   DIAGNOSTIC DATA (LABS, IMAGING, TESTING) - I reviewed patient records, labs, notes, testing and imaging myself where available.  Lab Results  Component Value Date   WBC 8.2 05/07/2021   HGB 14.4 05/07/2021   HCT 42.8 05/07/2021   MCV 93 05/07/2021   PLT 188 05/07/2021      Component Value Date/Time   NA 141 05/22/2021 1022   K 4.2 05/22/2021 1022   CL 103 05/22/2021 1022   CO2 27 05/22/2021 1022   GLUCOSE 101 (H) 05/22/2021 1022   GLUCOSE 108 (H) 02/26/2016 1112   BUN 13 05/22/2021 1022   CREATININE 0.86 05/22/2021 1022   CALCIUM  8.8 05/22/2021 1022   PROT 5.8 (L) 05/22/2021 1022   ALBUMIN 4.0 05/22/2021 1022   AST 19 05/22/2021 1022   ALT 24 05/22/2021 1022   ALKPHOS 56 05/22/2021 1022   BILITOT 0.7 05/22/2021 1022   GFRNONAA >60 02/26/2016 1112   GFRAA >60 02/26/2016 1112   Lab Results  Component Value Date   CHOL 127 03/19/2017   HDL 34 (L) 03/19/2017   LDLCALC 74 03/19/2017   TRIG 97 03/19/2017   CHOLHDL 3.7 03/19/2017   No results found for: "HGBA1C" No results found  for: "VITAMINB12" Lab Results  Component Value Date   TSH 4.050 05/07/2021        No data to display               No data to display           ASSESSMENT AND PLAN  83 y.o. year old male  has a past medical history of Cancer (HCC), Colon polyp, Coronary artery disease, Deafness in right ear, Esophageal stricture, GERD (gastroesophageal reflux disease), Hyperchloremia, Hypertension, Macular degeneration, Myocardial infarction (HCC) (2007), Ringing in ears, Rt facial numbness, Sacroiliac inflammation (HCC), Sudden  blockage of esophagus (07/2017), and Trigeminal neuralgia. here with    Trigeminal neuralgia - Plan: gabapentin  (NEURONTIN ) 400 MG capsule  Neurogenic pain - Plan: gabapentin  (NEURONTIN ) 400 MG capsule  Occipital neuritis - Plan: gabapentin  (NEURONTIN ) 400 MG capsule  Billy Knox is doing well, today. He feels trigeminal and occipital pain are well managed on gabapentin  most of the time but does have intermittent breakthrough pain. We will continue 400mg  QID and add 100mg  up to BID PRN. He is not interested in adding other meds or referral at this time. He will continue close follow up with care team. Healthy lifestyle habits encouraged. He will follow up in 1 year, sooner if needed.   No orders of the defined types were placed in this encounter.     Meds ordered this encounter  Medications   gabapentin  (NEURONTIN ) 400 MG capsule    Sig: TAKE 1 CAPSULE BY MOUTH 4 TIMES DAILY (EVERY 5 HOURS)    Dispense:  360 capsule    Refill:  3    Add to file, pt received refill yesterday    Supervising Provider:   AHERN, ANTONIA B [[id number]]   gabapentin  (NEURONTIN ) 100 MG capsule    Sig: Take 1 capsule (100 mg total) by mouth 2 (two) times daily as needed.    Dispense:  180 capsule    Refill:  3    Additional as needed dose for breakthrough pain. Continue 400mg  QID as prescribed.    Supervising Provider:   AHERN, ANTONIA B [[id number]]     I spent 30 minutes of face-to-face and non-face-to-face time with patient.  This included previsit chart review, lab review, study review, order entry, electronic health record documentation, patient education.    Terrilyn Fick, MSN, FNP-C 05/29/2023, 11:23 AM  [facility] [address], [address] [place], [place] [address] [phone]

## 2023-05-29 NOTE — Patient Instructions (Signed)
 Below is our plan:  We will continue gabapentin  400mg  four times daily. We will add gabapentin  100mg  up to twice daily as needed for breakthrough pain.   Please make sure you are staying well hydrated. I recommend 50-60 ounces daily. Well balanced diet and regular exercise encouraged. Consistent sleep schedule with 6-8 hours recommended.   Please continue follow up with care team as directed.   Follow up with me in 1 year   You may receive a survey regarding today's visit. I encourage you to leave honest feed back as I do use this information to improve patient care. Thank you for seeing me today!

## 2023-06-14 DIAGNOSIS — I25119 Atherosclerotic heart disease of native coronary artery with unspecified angina pectoris: Secondary | ICD-10-CM | POA: Diagnosis not present

## 2023-06-14 DIAGNOSIS — N401 Enlarged prostate with lower urinary tract symptoms: Secondary | ICD-10-CM | POA: Diagnosis not present

## 2023-06-14 DIAGNOSIS — I10 Essential (primary) hypertension: Secondary | ICD-10-CM | POA: Diagnosis not present

## 2023-06-26 DIAGNOSIS — I25119 Atherosclerotic heart disease of native coronary artery with unspecified angina pectoris: Secondary | ICD-10-CM | POA: Diagnosis not present

## 2023-06-26 DIAGNOSIS — E782 Mixed hyperlipidemia: Secondary | ICD-10-CM | POA: Diagnosis not present

## 2023-06-26 DIAGNOSIS — K5904 Chronic idiopathic constipation: Secondary | ICD-10-CM | POA: Diagnosis not present

## 2023-06-26 DIAGNOSIS — Z0000 Encounter for general adult medical examination without abnormal findings: Secondary | ICD-10-CM | POA: Diagnosis not present

## 2023-06-26 DIAGNOSIS — R7303 Prediabetes: Secondary | ICD-10-CM | POA: Diagnosis not present

## 2023-06-26 DIAGNOSIS — I10 Essential (primary) hypertension: Secondary | ICD-10-CM | POA: Diagnosis not present

## 2023-07-14 DIAGNOSIS — I25119 Atherosclerotic heart disease of native coronary artery with unspecified angina pectoris: Secondary | ICD-10-CM | POA: Diagnosis not present

## 2023-07-14 DIAGNOSIS — I10 Essential (primary) hypertension: Secondary | ICD-10-CM | POA: Diagnosis not present

## 2023-07-14 DIAGNOSIS — N401 Enlarged prostate with lower urinary tract symptoms: Secondary | ICD-10-CM | POA: Diagnosis not present

## 2023-08-14 DIAGNOSIS — I25119 Atherosclerotic heart disease of native coronary artery with unspecified angina pectoris: Secondary | ICD-10-CM | POA: Diagnosis not present

## 2023-08-14 DIAGNOSIS — I10 Essential (primary) hypertension: Secondary | ICD-10-CM | POA: Diagnosis not present

## 2023-08-14 DIAGNOSIS — N401 Enlarged prostate with lower urinary tract symptoms: Secondary | ICD-10-CM | POA: Diagnosis not present

## 2023-09-02 ENCOUNTER — Ambulatory Visit (INDEPENDENT_AMBULATORY_CARE_PROVIDER_SITE_OTHER): Admitting: Podiatry

## 2023-09-02 DIAGNOSIS — M79672 Pain in left foot: Secondary | ICD-10-CM | POA: Diagnosis not present

## 2023-09-02 DIAGNOSIS — M79671 Pain in right foot: Secondary | ICD-10-CM | POA: Diagnosis not present

## 2023-09-02 DIAGNOSIS — B351 Tinea unguium: Secondary | ICD-10-CM | POA: Diagnosis not present

## 2023-09-02 NOTE — Progress Notes (Signed)
 Patient presents for evaluation and treatment of tenderness and some redness around nails feet.  Tenderness around toes with walking and wearing shoes.  Physical exam:  General appearance: Alert, pleasant, and in no acute distress.  Vascular: Pedal pulses: DP 2/4 B/L, PT 0/4 B/L.  Moderate edema lower legs bilaterally  Neurological:    Dermatologic:  Nails thickened, disfigured, discolored 1-5 BL with subungual debris.  Redness and hypertrophic nail folds along nail folds bilaterally but no signs of drainage or infection.  Musculoskeletal:     Diagnosis: 1. Painful onychomycotic nails 1 through 5 bilaterally. 2. Pain toes 1 through 5 bilaterally.  Plan: Debrided onychomycotic nails 1 through 5 bilaterally.  Return 3 months RFC

## 2023-09-10 ENCOUNTER — Ambulatory Visit: Payer: Medicare Other | Admitting: Family Medicine

## 2023-09-11 DIAGNOSIS — H04123 Dry eye syndrome of bilateral lacrimal glands: Secondary | ICD-10-CM | POA: Diagnosis not present

## 2023-09-11 DIAGNOSIS — H40013 Open angle with borderline findings, low risk, bilateral: Secondary | ICD-10-CM | POA: Diagnosis not present

## 2023-09-11 DIAGNOSIS — H353134 Nonexudative age-related macular degeneration, bilateral, advanced atrophic with subfoveal involvement: Secondary | ICD-10-CM | POA: Diagnosis not present

## 2023-09-11 DIAGNOSIS — H33103 Unspecified retinoschisis, bilateral: Secondary | ICD-10-CM | POA: Diagnosis not present

## 2023-09-14 DIAGNOSIS — I25119 Atherosclerotic heart disease of native coronary artery with unspecified angina pectoris: Secondary | ICD-10-CM | POA: Diagnosis not present

## 2023-09-14 DIAGNOSIS — N401 Enlarged prostate with lower urinary tract symptoms: Secondary | ICD-10-CM | POA: Diagnosis not present

## 2023-09-14 DIAGNOSIS — I10 Essential (primary) hypertension: Secondary | ICD-10-CM | POA: Diagnosis not present

## 2023-09-23 DIAGNOSIS — G500 Trigeminal neuralgia: Secondary | ICD-10-CM | POA: Diagnosis not present

## 2023-09-23 DIAGNOSIS — K589 Irritable bowel syndrome without diarrhea: Secondary | ICD-10-CM | POA: Diagnosis not present

## 2023-10-14 DIAGNOSIS — I10 Essential (primary) hypertension: Secondary | ICD-10-CM | POA: Diagnosis not present

## 2023-10-14 DIAGNOSIS — I25119 Atherosclerotic heart disease of native coronary artery with unspecified angina pectoris: Secondary | ICD-10-CM | POA: Diagnosis not present

## 2023-10-14 DIAGNOSIS — N401 Enlarged prostate with lower urinary tract symptoms: Secondary | ICD-10-CM | POA: Diagnosis not present

## 2023-10-15 DIAGNOSIS — K080 Exfoliation of teeth due to systemic causes: Secondary | ICD-10-CM | POA: Diagnosis not present

## 2023-11-11 DIAGNOSIS — K080 Exfoliation of teeth due to systemic causes: Secondary | ICD-10-CM | POA: Diagnosis not present

## 2023-11-13 DIAGNOSIS — K080 Exfoliation of teeth due to systemic causes: Secondary | ICD-10-CM | POA: Diagnosis not present

## 2023-11-14 DIAGNOSIS — I25119 Atherosclerotic heart disease of native coronary artery with unspecified angina pectoris: Secondary | ICD-10-CM | POA: Diagnosis not present

## 2023-11-14 DIAGNOSIS — I10 Essential (primary) hypertension: Secondary | ICD-10-CM | POA: Diagnosis not present

## 2023-11-14 DIAGNOSIS — N401 Enlarged prostate with lower urinary tract symptoms: Secondary | ICD-10-CM | POA: Diagnosis not present

## 2023-11-27 DIAGNOSIS — K5900 Constipation, unspecified: Secondary | ICD-10-CM | POA: Diagnosis not present

## 2023-12-03 ENCOUNTER — Encounter: Payer: Self-pay | Admitting: Podiatry

## 2023-12-03 ENCOUNTER — Ambulatory Visit: Admitting: Podiatry

## 2023-12-03 DIAGNOSIS — M79672 Pain in left foot: Secondary | ICD-10-CM

## 2023-12-03 DIAGNOSIS — B351 Tinea unguium: Secondary | ICD-10-CM | POA: Diagnosis not present

## 2023-12-03 DIAGNOSIS — M79671 Pain in right foot: Secondary | ICD-10-CM | POA: Diagnosis not present

## 2023-12-03 NOTE — Progress Notes (Signed)
 Patient presents for evaluation and treatment of tenderness and some redness around nails feet.  Tenderness around toes with walking and wearing shoes.  Physical exam:  General appearance: Alert, pleasant, and in no acute distress.  Vascular: Pedal pulses: DP 2/4 B/L, PT 0/4 B/L. Moderate edema lower legs bilaterally.  Capillary refill time immediate bilaterally  Neurologic:  Dermatologic:  Nails thickened, disfigured, discolored 1-5 BL with subungual debris.  Redness and hypertrophic nail folds along nail folds bilaterally but no signs of drainage or infection.  Musculoskeletal:     Diagnosis: 1. Painful onychomycotic nails 1 through 5 bilaterally. 2. Pain toes 1 through 5 bilaterally.  Plan: -Debrided onychomycotic nails 1 through 5 bilaterally.  Sharply debrided nails with nail clipper and reduced with a power bur.  Return 3 months RFC

## 2023-12-14 DIAGNOSIS — N401 Enlarged prostate with lower urinary tract symptoms: Secondary | ICD-10-CM | POA: Diagnosis not present

## 2023-12-14 DIAGNOSIS — I10 Essential (primary) hypertension: Secondary | ICD-10-CM | POA: Diagnosis not present

## 2023-12-14 DIAGNOSIS — I25119 Atherosclerotic heart disease of native coronary artery with unspecified angina pectoris: Secondary | ICD-10-CM | POA: Diagnosis not present

## 2023-12-22 ENCOUNTER — Telehealth: Payer: Self-pay | Admitting: Interventional Cardiology

## 2023-12-22 NOTE — Telephone Encounter (Signed)
 Patient out of medication

## 2023-12-24 NOTE — Telephone Encounter (Signed)
 Pt is calling back about refill. Please advise if this can be sent today

## 2023-12-24 NOTE — Telephone Encounter (Signed)
 Pt scheduled 01/21/24 with Dr. Kriste, refill sent.

## 2024-01-21 ENCOUNTER — Ambulatory Visit: Payer: PRIVATE HEALTH INSURANCE | Attending: Cardiovascular Disease | Admitting: Internal Medicine

## 2024-01-21 NOTE — Progress Notes (Deleted)
" °  Cardiology Office Note:  .   Date:  01/21/2024  ID:  Billy Knox, DOB [birth year], MRN [id number] PCP: Aisha Harvey, MD  [facility] Providers Cardiologist:  Candyce Reek, MD { Click to update primary MD,subspecialty MD or APP then REFRESH:1}   History of Present Illness: .    Billy Knox is a 84 y.o. male who is a prior patient of Dr. 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  History of Present Illness       ROS: Remaining review of systems negative  Studies Reviewed: .        Results  Risk Assessment/Calculations:   {Does this patient have ATRIAL FIBRILLATION?[phone]} No BP recorded.  {Refresh Note OR Click here to enter BP  :1}***       Physical Exam:   VS:  There were no vitals taken for this visit.   Wt Readings from Last 3 Encounters:  05/29/23 224 lb (101.6 kg)  11/22/22 223 lb 9.6 oz (101.4 kg)  04/16/22 207 lb (93.9 kg)    GEN: Well nourished, well developed in no acute distress NECK: No JVD; No carotid bruits CARDIAC: ***RRR, no murmurs, no rubs, no gallops RESPIRATORY:  Clear to auscultation without rales, wheezing or rhonchi  ABDOMEN: Soft, non-tender, non-distended EXTREMITIES:  No edema; No deformity   ASSESSMENT AND PLAN: .    Assessment and Plan Assessment & Plan        {Are you ordering a CV Procedure (e.g. stress test, cath, DCCV,  TEE, etc)?   Press F2        :[id number]}    Follow up: ***  Signed, Emeline FORBES Calender, DO  01/21/2024 9:35 AM    [facility] "

## 2024-01-24 ENCOUNTER — Other Ambulatory Visit: Payer: Self-pay | Admitting: Physician Assistant

## 2024-03-03 ENCOUNTER — Ambulatory Visit: Admitting: Podiatry

## 2024-06-03 ENCOUNTER — Ambulatory Visit: Admitting: Family Medicine
